# Patient Record
Sex: Male | Born: 1965 | Race: White | Hispanic: No | Marital: Married | Smoking: Never smoker
Health system: Southern US, Community
[De-identification: ages and names within clinical notes are randomized; demographics above are authoritative.]

## PROBLEM LIST (undated history)

## (undated) DIAGNOSIS — K7460 Unspecified cirrhosis of liver: Secondary | ICD-10-CM

## (undated) DIAGNOSIS — I639 Cerebral infarction, unspecified: Secondary | ICD-10-CM

## (undated) DIAGNOSIS — I10 Essential (primary) hypertension: Secondary | ICD-10-CM

## (undated) DIAGNOSIS — E7800 Pure hypercholesterolemia, unspecified: Secondary | ICD-10-CM

---

## 2016-07-30 HISTORY — PX: PARACENTESIS: SHX844

## 2016-10-27 DIAGNOSIS — I639 Cerebral infarction, unspecified: Secondary | ICD-10-CM | POA: Diagnosis not present

## 2016-10-27 DIAGNOSIS — E782 Mixed hyperlipidemia: Secondary | ICD-10-CM

## 2016-10-27 DIAGNOSIS — I10 Essential (primary) hypertension: Secondary | ICD-10-CM

## 2016-10-27 DIAGNOSIS — K7031 Alcoholic cirrhosis of liver with ascites: Secondary | ICD-10-CM | POA: Diagnosis not present

## 2016-10-28 ENCOUNTER — Inpatient Hospital Stay (HOSPITAL_COMMUNITY)
Admission: EM | Admit: 2016-10-28 | Discharge: 2016-11-01 | DRG: 065 | Disposition: A | Discharge status: Rehabilitation Facility | Payer: Medicaid Other | Source: Other Acute Inpatient Hospital | Attending: Neurology | Admitting: Neurology

## 2016-10-28 ENCOUNTER — Inpatient Hospital Stay (HOSPITAL_COMMUNITY): Payer: Medicaid Other

## 2016-10-28 ENCOUNTER — Encounter (HOSPITAL_COMMUNITY): Payer: Self-pay | Admitting: Neurology

## 2016-10-28 DIAGNOSIS — I6502 Occlusion and stenosis of left vertebral artery: Secondary | ICD-10-CM | POA: Diagnosis present

## 2016-10-28 DIAGNOSIS — Z7141 Alcohol abuse counseling and surveillance of alcoholic: Secondary | ICD-10-CM

## 2016-10-28 DIAGNOSIS — Z79899 Other long term (current) drug therapy: Secondary | ICD-10-CM

## 2016-10-28 DIAGNOSIS — Z6830 Body mass index (BMI) 30.0-30.9, adult: Secondary | ICD-10-CM

## 2016-10-28 DIAGNOSIS — E119 Type 2 diabetes mellitus without complications: Secondary | ICD-10-CM | POA: Diagnosis present

## 2016-10-28 DIAGNOSIS — R29711 NIHSS score 11: Secondary | ICD-10-CM | POA: Diagnosis present

## 2016-10-28 DIAGNOSIS — K7030 Alcoholic cirrhosis of liver without ascites: Secondary | ICD-10-CM | POA: Diagnosis present

## 2016-10-28 DIAGNOSIS — D62 Acute posthemorrhagic anemia: Secondary | ICD-10-CM | POA: Diagnosis present

## 2016-10-28 DIAGNOSIS — Z8673 Personal history of transient ischemic attack (TIA), and cerebral infarction without residual deficits: Secondary | ICD-10-CM

## 2016-10-28 DIAGNOSIS — Z8249 Family history of ischemic heart disease and other diseases of the circulatory system: Secondary | ICD-10-CM | POA: Diagnosis not present

## 2016-10-28 DIAGNOSIS — K7010 Alcoholic hepatitis without ascites: Secondary | ICD-10-CM | POA: Diagnosis present

## 2016-10-28 DIAGNOSIS — E669 Obesity, unspecified: Secondary | ICD-10-CM | POA: Diagnosis present

## 2016-10-28 DIAGNOSIS — I10 Essential (primary) hypertension: Secondary | ICD-10-CM | POA: Diagnosis present

## 2016-10-28 DIAGNOSIS — G8191 Hemiplegia, unspecified affecting right dominant side: Secondary | ICD-10-CM | POA: Diagnosis present

## 2016-10-28 DIAGNOSIS — I63112 Cerebral infarction due to embolism of left vertebral artery: Secondary | ICD-10-CM | POA: Diagnosis not present

## 2016-10-28 DIAGNOSIS — I63212 Cerebral infarction due to unspecified occlusion or stenosis of left vertebral arteries: Secondary | ICD-10-CM | POA: Diagnosis not present

## 2016-10-28 DIAGNOSIS — E7800 Pure hypercholesterolemia, unspecified: Secondary | ICD-10-CM | POA: Diagnosis present

## 2016-10-28 DIAGNOSIS — R7303 Prediabetes: Secondary | ICD-10-CM | POA: Diagnosis not present

## 2016-10-28 DIAGNOSIS — E782 Mixed hyperlipidemia: Secondary | ICD-10-CM | POA: Diagnosis not present

## 2016-10-28 DIAGNOSIS — I63012 Cerebral infarction due to thrombosis of left vertebral artery: Secondary | ICD-10-CM | POA: Diagnosis not present

## 2016-10-28 DIAGNOSIS — F4323 Adjustment disorder with mixed anxiety and depressed mood: Secondary | ICD-10-CM | POA: Diagnosis not present

## 2016-10-28 DIAGNOSIS — I69351 Hemiplegia and hemiparesis following cerebral infarction affecting right dominant side: Secondary | ICD-10-CM | POA: Diagnosis not present

## 2016-10-28 DIAGNOSIS — E871 Hypo-osmolality and hyponatremia: Secondary | ICD-10-CM | POA: Diagnosis not present

## 2016-10-28 DIAGNOSIS — E785 Hyperlipidemia, unspecified: Secondary | ICD-10-CM

## 2016-10-28 DIAGNOSIS — I639 Cerebral infarction, unspecified: Secondary | ICD-10-CM | POA: Diagnosis present

## 2016-10-28 DIAGNOSIS — R471 Dysarthria and anarthria: Secondary | ICD-10-CM | POA: Diagnosis present

## 2016-10-28 DIAGNOSIS — D72829 Elevated white blood cell count, unspecified: Secondary | ICD-10-CM

## 2016-10-28 DIAGNOSIS — N501 Vascular disorders of male genital organs: Secondary | ICD-10-CM | POA: Diagnosis not present

## 2016-10-28 DIAGNOSIS — I69331 Monoplegia of upper limb following cerebral infarction affecting right dominant side: Secondary | ICD-10-CM | POA: Diagnosis not present

## 2016-10-28 DIAGNOSIS — G441 Vascular headache, not elsewhere classified: Secondary | ICD-10-CM | POA: Diagnosis not present

## 2016-10-28 DIAGNOSIS — K219 Gastro-esophageal reflux disease without esophagitis: Secondary | ICD-10-CM

## 2016-10-28 DIAGNOSIS — E876 Hypokalemia: Secondary | ICD-10-CM | POA: Diagnosis not present

## 2016-10-28 DIAGNOSIS — K7031 Alcoholic cirrhosis of liver with ascites: Secondary | ICD-10-CM | POA: Diagnosis not present

## 2016-10-28 HISTORY — DX: Essential (primary) hypertension: I10

## 2016-10-28 HISTORY — DX: Unspecified cirrhosis of liver: K74.60

## 2016-10-28 HISTORY — DX: Cerebral infarction, unspecified: I63.9

## 2016-10-28 HISTORY — DX: Pure hypercholesterolemia, unspecified: E78.00

## 2016-10-28 LAB — HEPARIN LEVEL (UNFRACTIONATED): Heparin Unfractionated: <0.1 IU/mL — ABNORMAL LOW (ref 0.30–0.70)

## 2016-10-28 LAB — ECHOCARDIOGRAM COMPLETE
AOASC: 41 cm
CHL CUP DOP CALC LVOT VTI: 23.4 cm
CHL CUP MV DEC (S): 292
CHL CUP STROKE VOLUME: 61 mL
E decel time: 292 msec
EERAT: 5.65
FS: 33 % (ref 28–44)
HEIGHTINCHES: 70 in
IVS/LV PW RATIO, ED: 1.17
LA diam end sys: 36 mm
LA diam index: 1.67 cm/m2
LASIZE: 36 mm
LAVOL: 42.7 mL
LAVOLA4C: 27.6 mL
LAVOLIN: 19.9 mL/m2
LDCA: 2.84 cm2
LV E/e'average: 5.65
LV SIMPSON'S DISK: 72
LV TDI E'MEDIAL: 7.7
LV dias vol index: 39 mL/m2
LV sys vol index: 11 mL/m2
LV sys vol: 23 mL (ref 21–61)
LVDIAVOL: 84 mL (ref 62–150)
LVEEMED: 5.65
LVELAT: 11.8 cm/s
LVOT SV: 66 mL
LVOT diameter: 19 mm
LVOT peak grad rest: 5 mmHg
LVOTPV: 113 cm/s
Lateral S' vel: 13.1 cm/s
MV pk A vel: 86.4 m/s
MVPKEVEL: 66.7 m/s
PW: 12 mm — AB (ref 0.6–1.1)
RV TAPSE: 20.9 mm
TDI e' lateral: 11.8
WEIGHTICAEL: 3446.23 [oz_av]

## 2016-10-28 LAB — CBC
HCT: 38.7 % — ABNORMAL LOW (ref 39.0–52.0)
Hemoglobin: 12.1 g/dL — ABNORMAL LOW (ref 13.0–17.0)
MCH: 27.1 pg (ref 26.0–34.0)
MCHC: 31.3 g/dL (ref 30.0–36.0)
MCV: 86.6 fL (ref 78.0–100.0)
PLATELETS: 242 10*3/uL (ref 150–400)
RBC: 4.47 MIL/uL (ref 4.22–5.81)
RDW: 18.9 % — AB (ref 11.5–15.5)
WBC: 11 10*3/uL — ABNORMAL HIGH (ref 4.0–10.5)

## 2016-10-28 LAB — CREATININE, SERUM
CREATININE: 0.72 mg/dL (ref 0.61–1.24)
GFR calc Af Amer: >60 mL/min (ref >60)
GFR calc non Af Amer: >60 mL/min (ref >60)

## 2016-10-28 LAB — MRSA PCR SCREENING: MRSA by PCR: NEGATIVE

## 2016-10-28 MED ORDER — ACETAMINOPHEN 325 MG PO TABS
650.0000 mg | ORAL_TABLET | ORAL | Status: DC | PRN
  Administered 2016-10-30 – 2016-10-31 (×2): 650 mg via ORAL
  Filled 2016-10-28 (×2): qty 2

## 2016-10-28 MED ORDER — HEPARIN (PORCINE) IN NACL 100-0.45 UNIT/ML-% IJ SOLN
1700.0000 [IU]/h | INTRAMUSCULAR | Status: DC
  Administered 2016-10-28: 1200 [IU]/h via INTRAVENOUS
  Administered 2016-10-29: 1450 [IU]/h via INTRAVENOUS
  Filled 2016-10-28 (×3): qty 250

## 2016-10-28 MED ORDER — SENNOSIDES-DOCUSATE SODIUM 8.6-50 MG PO TABS: 1.0000 | ORAL_TABLET | Freq: Every evening | ORAL | Status: DC | PRN

## 2016-10-28 MED ORDER — SODIUM CHLORIDE 0.9 % IV SOLN
INTRAVENOUS | Status: DC
  Administered 2016-10-28 – 2016-10-30 (×3): via INTRAVENOUS

## 2016-10-28 MED ORDER — STROKE: EARLY STAGES OF RECOVERY BOOK
Freq: Once | Status: AC
  Administered 2016-10-28: 14:00:00
  Filled 2016-10-28: qty 1

## 2016-10-28 MED ORDER — LORAZEPAM 2 MG/ML IJ SOLN
1.0000 mg | Freq: Once | INTRAMUSCULAR | Status: AC
  Administered 2016-10-28: 1 mg via INTRAVENOUS
  Filled 2016-10-28: qty 1

## 2016-10-28 MED ORDER — ACETAMINOPHEN 160 MG/5ML PO SOLN: 650.0000 mg | ORAL | Status: DC | PRN

## 2016-10-28 MED ORDER — ENOXAPARIN SODIUM 40 MG/0.4ML ~~LOC~~ SOLN: 40.0000 mg | SUBCUTANEOUS | Status: DC

## 2016-10-28 MED ORDER — ACETAMINOPHEN 650 MG RE SUPP: 650.0000 mg | RECTAL | Status: DC | PRN

## 2016-10-28 NOTE — H&P (Signed)
History and physical      HPI:                                                                                                                                         Jordan Thompson is an 51 y.o. male who was transferred to [facility] for further stroke workup. Patient recently was diagnosed with cirrhosis due to alcoholism to achieve recently quit 74 days ago. He also recently had a CVA on August 8 of 18. At that time his symptoms were left-sided weakness and dysphagia which had fully resolved. Patient presented to [facility] secondary to on onset of right arm weakness and right leg weakness along with numbness. Patient also endorsed some difficulty with walking but no difficulty with swallowing. On exam and was noted that he also had dysarthria. 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 She was transferred to [facility]  for further evaluation. Just prior to being transfer patient was given a load of 300 mg of Plavix.  Date last known well: Date: 10/27/2016 Time last known well: Time: 06:30 tPA Given: No: Recent CVA Modified Rankin: Rankin Score=0 NIH stroke scale 11  Past Medical History:  Diagnosis Date  . Cirrhosis of liver (HCC)   . CVA (cerebral vascular accident) (HCC)   . Hypercholesterolemia   . Hypertension     No past surgical history on file.  Family History  Problem Relation Age of Onset  . Hypertension Mother   . Hypertension Father    Social History:  reports that he has never smoked. He has never used smokeless tobacco. He reports that he drinks alcohol. He reports that he does not use drugs.  Allergies: Allergies not on file  Medications:                                                                                                                            Carvedilol Lasix Protonix Spironolactone  ROS:  History obtained from the patient  General ROS: negative for - chills, fatigue, fever, night sweats, weight gain or weight loss Psychological ROS: negative for - behavioral disorder, hallucinations, memory difficulties, mood swings or suicidal ideation Ophthalmic ROS: negative for - blurry vision, double vision, eye pain or loss of vision ENT ROS: negative for - epistaxis, nasal discharge, oral lesions, sore throat, tinnitus or vertigo Allergy and Immunology ROS: negative for - hives or itchy/watery eyes Hematological and Lymphatic ROS: negative for - bleeding problems, bruising or swollen lymph nodes Endocrine ROS: negative for - galactorrhea, hair pattern changes, polydipsia/polyuria or temperature intolerance Respiratory ROS: negative for - cough, hemoptysis, shortness of breath or wheezing Cardiovascular ROS: negative for - chest pain, dyspnea on exertion, edema or irregular heartbeat Gastrointestinal ROS: negative for - abdominal pain, diarrhea, hematemesis, nausea/vomiting or stool incontinence Genito-Urinary ROS: negative for - dysuria, hematuria, incontinence or urinary frequency/urgency Musculoskeletal ROS: negative for - joint swelling or muscular weakness Neurological ROS: as noted in HPI Dermatological ROS: negative for rash and skin lesion changes  Neurologic Examination:                                                                                                      Blood pressure (!) 130/101, pulse 85, temperature 98.4 F (36.9 C), temperature source Oral, resp. rate 15, height 5\' 10"  (1.778 m), weight 97.7 kg (215 lb 6.2 oz), SpO2 98 %.  HEENT-  Normocephalic, no lesions, without obvious abnormality.  Normal external eye and conjunctiva.  Normal TM's bilaterally.  Normal auditory  canals and external ears. Normal external nose, mucus membranes and septum.  Normal pharynx. Cardiovascular- S1, S2 normal, pulses palpable throughout   Lungs- chest clear, no wheezing, rales, normal symmetric air entry Abdomen- normal findings: bowel sounds normal Extremities- no edema Lymph-no adenopathy palpable Musculoskeletal-no joint tenderness, deformity or swelling Skin-warm and dry, no hyperpigmentation, vitiligo, or suspicious lesions  Neurological Examination Mental Status: Alert, oriented, thought content appropriate.  Speech dysarthric without evidence of aphasia.  Able to follow 3 step commands without difficulty. Cranial Nerves: II: Visual fields grossly normal,  III,IV, VI: ptosis not present, extra-ocular motions intact bilaterally, pupils equal, round, reactive to light and accommodation--- noted vertical nystagmus at rest and while performing horizontal gaze maneuvers V,VII: smile symmetric, facial light touch sensation decreased in the right lower face VIII: hearing normal bilaterally IX,X: uvula rises symmetrically XI: bilateral shoulder shrug XII: midline tongue extension Motor: Right : Upper extremity   0/5    Left:     Upper extremity   5/5  Lower extremity   0/5     Lower extremity   5/5 Tone and bulk:normal tone throughout; no atrophy noted Sensory: Pinprick and light touch decreased in the right face and right arm and leg Deep Tendon Reflexes: 2+ and symmetric throughout Plantars: Right: Upgoing  Left: downgoing Cerebellar: normal finger-to-nose on the left,  and normal heel-to-shin test on the left Gait: Not tested       Lab Results: Basic Metabolic Panel: No results for input(s): NA, K, CL, CO2, GLUCOSE, BUN, CREATININE,  CALCIUM, MG, PHOS in the last 168 hours.  Liver Function Tests: No results for input(s): AST, ALT, ALKPHOS, BILITOT, PROT, ALBUMIN in the last 168 hours. No results for input(s): LIPASE, AMYLASE in the last 168 hours. No results  for input(s): AMMONIA in the last 168 hours.  CBC: No results for input(s): WBC, NEUTROABS, HGB, HCT, MCV, PLT in the last 168 hours.  Cardiac Enzymes: No results for input(s): CKTOTAL, CKMB, CKMBINDEX, TROPONINI in the last 168 hours.  Lipid Panel: No results for input(s): CHOL, TRIG, HDL, CHOLHDL, VLDL, LDLCALC in the last 168 hours.  CBG: No results for input(s): GLUCAP in the last 168 hours.  Microbiology: No results found for this or any previous visit.  Coagulation Studies: No results for input(s): LABPROT, INR in the last 72 hours.  Imaging: No results found.     Assessment and plan discussed with with attending physician and they are in agreement.    Felicie Morn PA-C [facility] [phone]  10/28/2016, 10:44 AM   I have seen the patient reviewed the above note. He was admitted to [facility] for pica infarct on August 8, no vascular imaging was done with that hospitalization. He did not have echo. Lipid panel revealed LDL of 75   Assessment: 51 y.o. male transferred to [facility] with new onset stroke and occluded left vertebral artery and thrombus extending into the proximal vertebrobasilar junction. With his worsening, I think that this may be unstable and would favor anticoagulation with low-dose heparin is long as he doesn't have symptoms of areas of infarct.  Stroke Risk Factors - hyperlipidemia and hypertension   1. HgbA1c, fasting lipid panel 2. MRI of the brain without contrast 3. Frequent neuro checks 4. Echocardiogram 5. Prophylactic therapy-Antiplatelet med: Aspirin - dose 325mg  PO or 300mg  PR 6. Risk factor modification 7. Telemetry monitoring 8. PT consult, OT consult, Speech consult 9. If MRI without large area of infarction in the cerebellum, then I would favor starting low-dose heparin. 10. please page stroke NP  Or  PA  Or MD  from 8am -4 pm as this patient will be followed by the stroke team at this point.   You can look them up on  [email or web address]    Ritta Slot, MD [facility] [phone]  If 7pm- 7am, please page neurology on call as listed in AMION.

## 2016-10-28 NOTE — Progress Notes (Signed)
  Echocardiogram 2D Echocardiogram has been performed.  Jordan Thompson 10/28/2016, 11:55 AM

## 2016-10-28 NOTE — Evaluation (Signed)
Clinical/Bedside Swallow Evaluation Patient Details  Name: Jordan Thompson MRN: [id number] Date of Birth: 09-Jul-1965  Today's Date: 10/28/2016 Time: SLP Start Time (ACUTE ONLY): 1606 SLP Stop Time (ACUTE ONLY): 1621 SLP Time Calculation (min) (ACUTE ONLY): 15 min  Past Medical History:  Past Medical History:  Diagnosis Date  . Cirrhosis of liver (HCC)   . CVA (cerebral vascular accident) (HCC)   . Hypercholesterolemia   . Hypertension    Past Surgical History: No past surgical history on file. 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 Pt had recent admission for CVA 10/06/16 and he reports dysphagia for three days until he was advanced to a mechanical soft diet and thin liquids. He denies any other cognitive/linguistic deficits at that time. PMH also includes cirrhosis of liver, HTN   Assessment / Plan / Recommendation Clinical Impression  Pt has occasional throat clearing during PO intake but no overt coughing despite challenging with the 3 oz water test. His swallow appears to occur swiftly without additional subswallows or changes in vocal quality. His voice is hoarse at baseline, which he describes as an acute worsening, although he says that his voice first started to change with his stroke earlier this month. For today, recommend Dys 3 diet and thin liquids with use of aspiration precautions. SLP will f/u to monitor for tolerance versus need for instrumental testing. SLP Visit Diagnosis: Dysphagia, unspecified (R13.10)    Aspiration Risk  Mild aspiration risk    Diet Recommendation Dysphagia 3 (Mech soft);Thin liquid   Liquid Administration via: Cup;Straw Medication Administration: Whole meds with liquid Supervision: Patient able to self feed;Intermittent supervision to cue for compensatory strategies Compensations: Slow rate;Small sips/bites Postural Changes: Seated upright at  90 degrees    Other  Recommendations Oral Care Recommendations: Oral care BID   Follow up Recommendations  (tba)      Frequency and Duration min 2x/week  1 week       Prognosis Prognosis for Safe Diet Advancement: Good      Swallow Study   General HPI: Pt is a 51 y.o.[facility]ans[facility] with acute infarct L paramedian medulla and acute and subacute bilateral PICA infarcts. Pt had recent admission for CVA 10/06/16 and he reports dysphagia for three days until he was advanced to a mechanical soft diet and thin liquids. He denies any other cognitive/linguistic deficits at that time. PMH also includes cirrhosis of liver, HTN Type of Study: Bedside Swallow Evaluation Previous Swallow Assessment: none in chart - see HPI Diet Prior to this Study: NPO Temperature Spikes Noted: No Respiratory Status: Room air History of Recent Intubation: No Behavior/Cognition: Alert;Cooperative;Pleasant mood Oral Care Completed by SLP: No Oral Cavity - Dentition: Adequate natural dentition Patient Positioning: Upright in bed Baseline Vocal Quality: Hoarse (pt reports acute worsening but some hoarsenss from prior CVA) Volitional Cough: Strong Volitional Swallow: Able to elicit    Oral/Motor/Sensory Function Overall Oral Motor/Sensory Function: Within functional limits   Ice Chips Ice chips: Not tested   Thin Liquid Thin Liquid: Impaired Presentation: Straw Pharyngeal  Phase Impairments: Throat Clearing - Delayed    Nectar Thick Nectar Thick Liquid: Not tested   Honey Thick Honey Thick Liquid: Not tested   Puree Puree: Within functional limits Presentation: Spoon   Solid   GO   Solid: Impaired Pharyngeal Phase Impairments: Throat Clearing - Delayed        Maxcine Hamaiewonsky, Larinda Herter 10/28/2016,4:42 PM  Germain Osgood, M.A. CCC-SLP [phone]

## 2016-10-28 NOTE — Progress Notes (Signed)
ANTICOAGULATION CONSULT NOTE - Initial Consult  Pharmacy Consult for heparin Indication: stroke  Allergies not on file  Patient Measurements: Height: 5\' 10"  (177.8 cm) Weight: 215 lb 6.2 oz (97.7 kg) IBW/kg (Calculated) : 73 Heparin Dosing Weight: 93kg  Vital Signs: Temp: 97.9 F (36.6 C) (08/30 1200) Temp Source: Oral (08/30 1200) BP: 140/90 (08/30 1200) Pulse Rate: 77 (08/30 1200)  Labs:  Recent Labs  10/28/16 1043  HGB 12.1*  HCT 38.7*  PLT 242  CREATININE 0.72    Estimated Creatinine Clearance: 128.1 mL/min (by C-G formula based on SCr of 0.72 mg/dL).   Medical History: Past Medical History:  Diagnosis Date  . Cirrhosis of liver (HCC)   . CVA (cerebral vascular accident) (HCC)   . Hypercholesterolemia   . Hypertension     Medications:  Infusions:  . sodium chloride 75 mL/hr at 10/28/16 1409  . heparin      Assessment: Jordan Thompson [facility] work up. 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 To start IV heparin per neuro recommendations. Baseline H/H and platelets are WNL.   Goal of Therapy:  Heparin level 0.3-0.5 units/ml Monitor platelets by anticoagulation protocol: Yes   Plan:  Heparin gtt 1200 units/hr - NO BOLUS Check an 8 hr heparin level Daily heparin level and CBC  Annabel Gibeau, Drake Leachachel Lynn 10/28/2016,2:40 PM

## 2016-10-29 DIAGNOSIS — I63212 Cerebral infarction due to unspecified occlusion or stenosis of left vertebral arteries: Secondary | ICD-10-CM

## 2016-10-29 DIAGNOSIS — Z8673 Personal history of transient ischemic attack (TIA), and cerebral infarction without residual deficits: Secondary | ICD-10-CM

## 2016-10-29 DIAGNOSIS — E785 Hyperlipidemia, unspecified: Secondary | ICD-10-CM

## 2016-10-29 LAB — CBC
HCT: 37.5 % — ABNORMAL LOW (ref 39.0–52.0)
Hemoglobin: 11.5 g/dL — ABNORMAL LOW (ref 13.0–17.0)
MCH: 26.7 pg (ref 26.0–34.0)
MCHC: 30.7 g/dL (ref 30.0–36.0)
MCV: 87 fL (ref 78.0–100.0)
PLATELETS: 220 10*3/uL (ref 150–400)
RBC: 4.31 MIL/uL (ref 4.22–5.81)
RDW: 19.2 % — ABNORMAL HIGH (ref 11.5–15.5)
WBC: 11.5 10*3/uL — ABNORMAL HIGH (ref 4.0–10.5)

## 2016-10-29 LAB — BASIC METABOLIC PANEL
Anion gap: 9 (ref 5–15)
CHLORIDE: 104 mmol/L (ref 101–111)
CO2: 25 mmol/L (ref 22–32)
Calcium: 9 mg/dL (ref 8.9–10.3)
Creatinine, Ser: 0.7 mg/dL (ref 0.61–1.24)
GFR calc Af Amer: >60 mL/min (ref >60)
GFR calc non Af Amer: >60 mL/min (ref >60)
GLUCOSE: 115 mg/dL — AB (ref 65–99)
POTASSIUM: 4 mmol/L (ref 3.5–5.1)
SODIUM: 138 mmol/L (ref 135–145)

## 2016-10-29 LAB — LIPID PANEL
CHOL/HDL RATIO: 5.2 ratio
CHOLESTEROL: 88 mg/dL (ref 0–200)
HDL: 17 mg/dL — ABNORMAL LOW (ref >40)
LDL Cholesterol: 50 mg/dL (ref 0–99)
Triglycerides: 104 mg/dL (ref <150)
VLDL: 21 mg/dL (ref 0–40)

## 2016-10-29 LAB — RAPID URINE DRUG SCREEN, HOSP PERFORMED
Amphetamines: NOT DETECTED
BENZODIAZEPINES: POSITIVE — AB
Barbiturates: NOT DETECTED
COCAINE: NOT DETECTED
OPIATES: NOT DETECTED
TETRAHYDROCANNABINOL: NOT DETECTED

## 2016-10-29 LAB — HEPARIN LEVEL (UNFRACTIONATED)
Heparin Unfractionated: <0.1 IU/mL — ABNORMAL LOW (ref 0.30–0.70)
Heparin Unfractionated: 0.15 IU/mL — ABNORMAL LOW (ref 0.30–0.70)

## 2016-10-29 LAB — AMMONIA: Ammonia: 29 umol/L (ref 9–35)

## 2016-10-29 LAB — HEMOGLOBIN A1C
Hgb A1c MFr Bld: 6.1 % — ABNORMAL HIGH (ref 4.8–5.6)
MEAN PLASMA GLUCOSE: 128.37 mg/dL

## 2016-10-29 LAB — HIV ANTIBODY (ROUTINE TESTING W REFLEX): HIV SCREEN 4TH GENERATION: NONREACTIVE

## 2016-10-29 LAB — PROTIME-INR
INR: 1.19
PROTHROMBIN TIME: 15 s (ref 11.4–15.2)

## 2016-10-29 MED ORDER — ASPIRIN 325 MG PO TABS
325.0000 mg | ORAL_TABLET | Freq: Once | ORAL | Status: AC
  Administered 2016-10-29: 325 mg via ORAL
  Filled 2016-10-29: qty 1

## 2016-10-29 MED ORDER — HEPARIN (PORCINE) IN NACL 100-0.45 UNIT/ML-% IJ SOLN
2100.0000 [IU]/h | INTRAMUSCULAR | Status: DC
  Administered 2016-10-29 – 2016-10-30 (×2): 1850 [IU]/h via INTRAVENOUS
  Administered 2016-10-30 – 2016-10-31 (×2): 2100 [IU]/h via INTRAVENOUS
  Filled 2016-10-29 (×5): qty 250

## 2016-10-29 NOTE — Evaluation (Signed)
Physical Therapy Evaluation Patient Details Name: Jordan Thompson MRN: [id number] DOB: October 07, 1965 Today's Date: 10/29/2016   History of Present Illness  Pt is a 51 y.o. maletransferred to [facility] for stroke work up. 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 Pt had recent admission for left PICA stroke on 10/06/16. PMH also includes cirrhosis of liver, HTN  Clinical Impression  Patient presents with decreased independence with mobility due to R hemiparesis, sensory and balance deficits.  He will benefit from skilled PT in the acute setting to allow return home with family support following CIR level rehab stay.  Currently requiring +2 A for sit to stand and bed to chair transfers with impaired sitting and standing balance.  Was mod I with RW at home previously.    Follow Up Recommendations CIR    Equipment Recommendations  Other (comment) (TBA)    Recommendations for Other Services       Precautions / Restrictions Precautions Precautions: Fall Precaution Comments: pusher      Mobility  Bed Mobility Overal bed mobility: Needs Assistance Bed Mobility: Rolling;Sidelying to Sit Rolling: Min assist Sidelying to sit: Mod assist;+2 for safety/equipment   Sit to supine: Max assist   General bed mobility comments: assist and cues for hand placement coming upright, assist for legs and trunk  Transfers Overall transfer level: Needs assistance Equipment used: 2 person hand held assist Transfers: Sit to/from [employer] Sit to Stand: Max assist;+2 physical assistance Stand pivot transfers:  (required [place] eo fstedy) Squat pivot transfers: Max assist     General transfer comment: assist to block R LE and for standing to facilitate hip extension, stood second attempt with chair back on L side to facilitate L weight shift; pivot to chair with cues for hand placement and L weight shift,  lifting help to get to chair  Ambulation/Gait             General Gait Details: unable  Stairs            Wheelchair Mobility    Modified Rankin (Stroke Patients Only) Modified Rankin (Stroke Patients Only) Pre-Morbid Rankin Score: Moderate disability Modified Rankin: Severe disability     Balance Overall balance assessment: Needs assistance   Sitting balance-Leahy Scale: Poor Sitting balance - Comments: R bias; at least minguard to close S for safety   Standing balance support: Single extremity supported Standing balance-Leahy Scale: Zero Standing balance comment: + 2 for standing with R knee and hip blocked and cues/A for L weight shift x about 1-2 minutes x 2 reps                             Pertinent Vitals/Pain Pain Assessment: No/denies pain    Home Living Family/patient expects to be discharged to:: Inpatient rehab Living Arrangements: Spouse/significant other Available Help at Discharge: Family Type of Home: House Home Access: Level entry     Home Layout: Two level;Bed/bath upstairs Home Equipment: Walker - 2 wheels;Grab bars - tub/shower      Prior Function Level of Independence: Independent with assistive device(s)         Comments: wife assisted with entry and exit from shower; using walker since last stroke and doing "his own PT with weights and exercises given by his neice who is a nurse in PT"     Hand Dominance   Dominant Hand: Right    Extremity/Trunk  Assessment   Upper Extremity Assessment Upper Extremity Assessment: Defer to OT evaluation RUE Deficits / Details: Pt with minimal active shoulder elevation and trace elbow flexion with facilitation. Brunstrom I (presynergy)- hand and arm. non-functional RUE at this time RUE Sensation: decreased light touch RUE Coordination: decreased fine motor;decreased gross motor    Lower Extremity Assessment Lower Extremity Assessment: RLE deficits/detail RLE Deficits /  Details: no active movement, but toes move involunarily with stimulation    Cervical / Trunk Assessment Cervical / Trunk Assessment: Other exceptions Cervical / Trunk Exceptions: Able to achieve midline with vc but unable to maintain  Communication   Communication: Other (comment) (hoarse)  Cognition Arousal/Alertness: Awake/alert Behavior During Therapy: WFL for tasks assessed/performed Overall Cognitive Status: No family/caregiver present to determine baseline cognitive functioning                                 General Comments: will further assess      General Comments General comments (skin integrity, edema, etc.): Patient anxious and reports feeling depression about stroke deficits.      Exercises     Assessment/Plan    PT Assessment Patient needs continued PT services  PT Problem List Decreased strength;Decreased balance;Decreased knowledge of use of DME;Decreased activity tolerance;Decreased safety awareness;Decreased mobility;Impaired tone       PT Treatment Interventions DME instruction;Therapeutic activities;Patient/family education;Therapeutic exercise;Balance training;Wheelchair mobility training;Functional mobility training;Neuromuscular re-education    PT Goals (Current goals can be found in the Care Plan section)  Acute Rehab PT Goals Patient Stated Goal: to go to rehab PT Goal Formulation: With patient Time For Goal Achievement: 11/12/16 Potential to Achieve Goals: Good    Frequency Min 4X/week   Barriers to discharge        Co-evaluation               AM-PAC PT "6 Clicks" Daily Activity  Outcome Measure Difficulty turning over in bed (including adjusting bedclothes, sheets and blankets)?: A Lot Difficulty moving from lying on back to sitting on the side of the bed? : Unable Difficulty sitting down on and standing up from a chair with arms (e.g., wheelchair, bedside commode, etc,.)?: Unable Help needed moving to and from a bed  to chair (including a wheelchair)?: Total Help needed walking in hospital room?: Total Help needed climbing 3-5 steps with a railing? : Total 6 Click Score: 7    End of Session Equipment Utilized During Treatment: Gait belt Activity Tolerance: Patient tolerated treatment well Patient left: in chair;with call bell/phone within reach   PT Visit Diagnosis: Hemiplegia and hemiparesis;Muscle weakness (generalized) (M62.81);Other symptoms and signs involving the nervous system (R29.898) Hemiplegia - Right/Left: Right Hemiplegia - dominant/non-dominant: Dominant Hemiplegia - caused by: Cerebral infarction    Time: 1315-1346 PT Time Calculation (min) (ACUTE ONLY): 31 min   Charges:   PT Evaluation $PT Eval High Complexity: 1 High PT Treatments $Therapeutic Activity: 8-22 mins   PT G CodesSheran Lawless:        Cyndi Markian Glockner, [place] [phone] 10/29/2016   Elray Mcgregorynthia Malaiyah Achorn 10/29/2016, 5:15 PM

## 2016-10-29 NOTE — Evaluation (Signed)
Speech Language Pathology Evaluation Patient Details Name: Jordan Thompson MRN: [id number] DOB: [birth year] Today's Date: 10/29/2016 Time: [id number] SLP Time Calculation (min) (ACUTE ONLY): 18 min  Problem List:  Patient Active Problem List   Diagnosis Date Noted  . Stroke (cerebrum) (HCC) 10/28/2016   Past Medical History:  Past Medical History:  Diagnosis Date  . Cirrhosis of liver (HCC)   . CVA (cerebral vascular accident) (HCC)   . Hypercholesterolemia   . Hypertension    Past Surgical History: No past surgical history on file. HPI:  Pt is a 51 [facility] with acute infarct L paramedian medulla and acute and subacute bilateral PICA infarcts. Pt had recent admission for CVA 10/06/16 and he reports dysphagia for three days until he was advanced to a mechanical soft diet and thin liquids. He denies any other cognitive/linguistic deficits at that time. PMH also includes cirrhosis of liver, HTN   Assessment / Plan / Recommendation Clinical Impression  Pt demonstrates mild speech and voice impairment secondary to right CN XII weakness and suspected paresis of right arytenoid. Pt has lingual deviation to the right with very mild dysarthria. Vocal quality is mildly dysphonic. He feels that both conditions are rapidly improving. No cognitive or linguistic deficits were identified. Will not recommend immediate f/u with SLP as further spontaneous recovery of speech is expected. Advised pt to f/u with ENT after d/c if vocal quality has not resolved. Pt can then be referred to SLP if ENT recommends it. No further acute f/u needed.     SLP Assessment  SLP Recommendation/Assessment: Patient needs continued Speech Lanaguage Pathology Services SLP Visit Diagnosis: Dysarthria and anarthria (R47.1)    Follow Up Recommendations  Outpatient SLP (ENT consult first)    Frequency and Duration           SLP Evaluation Cognition  Overall Cognitive Status: Within Functional  Limits for tasks assessed Arousal/Alertness: Awake/alert Orientation Level: Oriented X4 Attention: Alternating Alternating Attention: Appears intact Memory: Appears intact Awareness: Appears intact Problem Solving: Appears intact Safety/Judgment: Appears intact       Comprehension  Auditory Comprehension Overall Auditory Comprehension: Appears within functional limits for tasks assessed    Expression Verbal Expression Overall Verbal Expression: Appears within functional limits for tasks assessed Written Expression Dominant Hand: Right Written Expression: Unable to assess (comment)   Oral / Motor  Oral Motor/Sensory Function Overall Oral Motor/Sensory Function: Moderate impairment Facial ROM: Within Functional Limits Facial Symmetry: Within Functional Limits Facial Strength: Within Functional Limits Facial Sensation: Within Functional Limits Lingual ROM: Within Functional Limits Lingual Symmetry: Abnormal symmetry right;Suspected CN XII (hypoglossal) dysfunction Lingual Strength: Within Functional Limits Lingual Sensation: Within Functional Limits Velum: Within Functional Limits Mandible: Within Functional Limits Motor Speech Overall Motor Speech: Impaired Respiration: Within functional limits Phonation: Hoarse Resonance: Within functional limits Articulation: Impaired Level of Impairment: Conversation Intelligibility: Intelligible Motor Planning: Witnin functional limits Motor Speech Errors: Aware;Consistent Effective Techniques: Over-articulate   GO                   Harlon DittyBonnie Nautika Cressey, MA CCC-SLP [phone]  Claudine MoutonDeBlois, Daden Mahany Caroline 10/29/2016, 10:13 AM

## 2016-10-29 NOTE — Progress Notes (Signed)
ANTICOAGULATION CONSULT NOTE   Pharmacy Consult for heparin Indication: stroke  Allergies  Allergen Reactions  . Aleve [Naproxen Sodium] Other (See Comments)    Pt will not take  . Penicillins Nausea And Vomiting and Other (See Comments)    Pt states had a rxn as a child Has patient had a PCN reaction causing immediate rash, facial/tongue/throat swelling, SOB or lightheadedness with hypotension: No Has patient had a PCN reaction causing severe rash involving mucus membranes or skin necrosis: No Has patient had a PCN reaction that required hospitalization: No Has patient had a PCN reaction occurring within the last 10 years: No If all of the above answers are "NO", then may proceed with Cephalosporin use.  . Pepto-Bismol  [Bismuth Subsalicylate] Nausea Only    Patient Measurements: Height: 5\' 10"  (177.8 cm) Weight: 215 lb 6.2 oz (97.7 kg) IBW/kg (Calculated) : 73 Heparin Dosing Weight: 93kg  Vital Signs: Temp: 98.4 F (36.9 C) (08/31 1600) Temp Source: Oral (08/31 1600) BP: 144/100 (08/31 2000) Pulse Rate: 76 (08/31 2000)  Labs:  Recent Labs  10/28/16 1043 10/28/16 2312 10/29/16 0220 10/29/16 0719 10/29/16 1002 10/29/16 2001  HGB 12.1*  --  11.5*  --   --   --   HCT 38.7*  --  37.5*  --   --   --   PLT 242  --  220  --   --   --   LABPROT  --   --   --  15.0  --   --   INR  --   --   --  1.19  --   --   HEPARINUNFRC  --  <0.10*  --   --  <0.10* 0.15*  CREATININE 0.72  --   --  0.70  --   --     Estimated Creatinine Clearance: 128.1 mL/min (by C-G formula based on SCr of 0.7 mg/dL).  Medications:  Infusions:  . sodium chloride 75 mL/hr at 10/29/16 1900  . heparin      Assessment: Jordan Thompson transferred from [facility]. 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 Started on IV heparin per neuro recommendations. Heparin level remains undetectable despite rate  increases. No bleeding or other issues notes.   PM f/u > heparin level remains below goal this evening.  Per RN, no known issues with IV infusion.  Goal of Therapy:  Heparin level 0.3-0.5 units/ml Monitor platelets by anticoagulation protocol: Yes   Plan:  Increase heparin gtt to 1850 units/hr. Check an 6 hr heparin level Daily heparin level and CBC  Tad MooreJessica Taimane Stimmel, Pharm D, BCPS  Clinical Pharmacist Pager [phone]  10/29/2016 8:44 PM    Lillion Elbert, Gwenlyn FoundJessica C 10/29/2016,8:44 PM

## 2016-10-29 NOTE — Progress Notes (Signed)
STROKE TEAM PROGRESS NOTE   HISTORY OF PRESENT ILLNESS (per record)  Jordan Thompson is an 51 y.o. male who was [facility] for further stroke workup. Patient recently was diagnosed with cirrhosis due to alcoholism to achieve recently quit 74 days ago. He also recently had a CVA on August 8 of 18. At that time his symptoms were left-sided weakness and dysphagia which had fully resolved. Patient [facility] secondary to on onset of right arm weakness and right leg weakness along with numbness. Patient also endorsed some difficulty with walking but no difficulty with swallowing. On exam and was noted that he also had dysarthria. 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 She was [facility] for [facility] evaluation. Just prior to being transfer patient was given a load of 300 mg of Plavix.  In early august, had LeftPICA Ischemic Stroke   Date last known well: Date: 10/27/2016 Time last known well: Time: 06:30 tPA Given: No: Recent CVA Modified Rankin: Rankin Score=0 NIH stroke scale 11   Patient was not administered IV t-PA secondary to recent cva. He was admitted to the neuro ICU for further evaluation and treatment.   SUBJECTIVE (INTERVAL HISTORY) No acute events overnight. Pt feels his slurry speech getting better, hoarseness getting better. On heparin drip but level not detectable. Will give one time dose ASA today. Continue heparin.    OBJECTIVE Temp:  [97.9 F (36.6 C)-98.6 F (37 C)] 98.4 F (36.9 C) (08/31 1600) Pulse Rate:  [61-87] 76 (08/31 2000) Cardiac Rhythm: Normal sinus rhythm (08/31 0900) Resp:  [11-18] 13 (08/31 2000) BP: (116-151)/(83-104) 144/100 (08/31 2000) SpO2:  [94 %-99 %] 97 % (08/31 2000)  CBC:   Recent  Labs Lab 10/28/16 1043 10/29/16 0220  WBC 11.0* 11.5*  HGB 12.1* 11.5*  HCT 38.7* 37.5*  MCV 86.6 87.0  PLT 242 220    Basic Metabolic Panel:   Recent Labs Lab 10/28/16 1043 10/29/16 0719  NA  --  138  K  --  4.0  CL  --  104  CO2  --  25  GLUCOSE  --  115*  BUN  --  <5*  CREATININE 0.72 0.70  CALCIUM  --  9.0    Lipid Panel:     Component Value Date/Time   CHOL 88 10/29/2016 0220   TRIG 104 10/29/2016 0220   HDL 17 (L) 10/29/2016 0220   CHOLHDL 5.2 10/29/2016 0220   VLDL 21 10/29/2016 0220   LDLCALC 50 10/29/2016 0220   HgbA1c:  Lab Results  Component Value Date   HGBA1C 6.1 (H) 10/29/2016   Urine Drug Screen:     Component Value Date/Time   LABOPIA NONE DETECTED 10/29/2016 0853   COCAINSCRNUR NONE DETECTED 10/29/2016 0853   LABBENZ POSITIVE (A) 10/29/2016 0853   AMPHETMU NONE DETECTED 10/29/2016 0853   THCU NONE DETECTED 10/29/2016 0853   LABBARB NONE DETECTED 10/29/2016 0853    Alcohol Level No results found for: ETH  IMAGING I have personally reviewed the radiological images below and agree with the radiology interpretations.  [name] Contrast 10/28/2016 IMPRESSION: 1. Acute infarct in the left paramedian medulla. 2. Acute and subacute bilateral PICA territory infarcts as above. 3. Late subacute to chronic left medullary infarct.   CTA neck  - left VA occlusion with  thready reconstitution in the neck - atherosclerosis without hemodynamically significant stenosis of the carotid arteries.  - severe right C6-7 neural foraminal narrowing  CTA head - occluded left VA and thrombus extending into central VBJ, at risk of embolization - no anterior cirdulation LVO or significant stenosis  TTE - Left ventricle: The cavity size was normal. There was mild   concentric hypertrophy. Systolic function was normal. The   estimated ejection fraction was in the range of 60% to 65%. Wall   motion was normal; there were no regional wall motion    abnormalities. Doppler parameters are consistent with abnormal   left ventricular relaxation (grade 1 diastolic dysfunction). - Aortic valve: Transvalvular velocity was within the normal range.   There was no stenosis. There was no regurgitation. - Aorta: Ascending aortic diameter: 41 mm (S). - Ascending aorta: The ascending aorta was mildly dilated. - Mitral valve: Transvalvular velocity was within the normal range.   There was no evidence for stenosis. There was no regurgitation. - Right ventricle: The cavity size was normal. Wall thickness was   normal. Systolic function was normal. - Tricuspid valve: There was no regurgitation.   PHYSICAL EXAM  Temp:  [97.9 F (36.6 C)-98.6 F (37 C)] 98.4 F (36.9 C) (08/31 1600) Pulse Rate:  [61-87] 76 (08/31 2000) Resp:  [11-18] 13 (08/31 2000) BP: (116-151)/(83-104) 144/100 (08/31 2000) SpO2:  [94 %-99 %] 97 % (08/31 2000)  General - Well nourished, well developed, in no apparent distress.  Ophthalmologic - Fundi not visualized due to .  Cardiovascular - Regular rate and rhythm.  Mental Status -  Level of arousal and orientation to time, place, and person were intact. Language including expression, naming, repetition, comprehension was assessed and found intact. Fund of Knowledge was assessed and was intact.  Cranial Nerves II - XII - II - Visual field intact OU. III, IV, VI - Extraocular movements intact, PERRL, left mild ptosis. V - Facial sensation intact bilaterally. VII - right nasolabial fold flattening VIII - Hearing & vestibular intact bilaterally, left end-gaze nystagmus. X - Palate elevates symmetrically, mild dysarthria and hoarseness. XI - Chin turning & shoulder shrug intact bilaterally. XII - Tongue protrusion intact.  Motor Strength - The patient's strength was normal in LUE and LLE, but RUE 0/5 and RLE 1/5.  Bulk was normal and fasciculations were absent.   Motor Tone - Muscle tone was assessed at the neck and  appendages and was normal.  Reflexes - The patient's reflexes were 1+ in all extremities and right babinski and triple reflex positive  Sensory - Light touch, temperature/pinprick were assessed and were decreased on the right, about 60-80% of the left.    Coordination - The patient had mild dysmetria at left hands with left FTN.  Tremor was absent.  Gait and Station - not tested.  ASSESSMENT/PLAN Mr. Maxximus Gotay is a 51 y.o. male with history of recent left PICA stroke, long term alcohol use disorder leading to cirrhosis and hepatitis presenting with . He did not receive IV t-PA due recent stroke .   Stroke - acute right cerebellar PICA territory and left paramedian medulla infarcts, likely due to left VA dissection/occlusion with thrombus extension to VBJ. presumebaly left VA also supplies part of the right PICA territory.   Resultant: right sided weakness  MRI head: Acute infarct in the left paramedian medulla. Acute and subacute bilateral PICA territory infarcts as above. Late subacute to chronic left medullary infarct.  CTA head and neck -  left VA occlusion with thrombus extension to VBJ  2D Echo  EF 60-65% no valvular problems   LDL 50  HgbA1c 6.1  Heparin gtt for VTE prophylaxis Diet Heart Room service appropriate? Yes; Fluid consistency: Thin  aspirin 81 mg daily prior to admission, now on heparin IV. Once stable on heparin IV, consider to transition to DAPT for 3 months and then plavix alone for stroke prevention  Patient counseled to be compliant with his antithrombotic medications  Ongoing aggressive stroke risk factor management  Therapy recommendations:  CIR  Disposition:  Pending  Recent stroke 10/06/16  Left PICA and left medullary infarcts - due to left VA occlusion  Dysphagia resolved over time  On ASA and lipitor PTA  Liver cirrhosis   Alcohol related  Normal INR and ammonia level  On B1/FA/MVI  Hypertension  Stable  Permissive  hypertension (OK if < 180/105) but gradually normalize in 5-7 days  Long-term BP goal normotensive  Hyperlipidemia  Home meds:  Atorvastatin 40  LDL 50, goal < 70  Continue statin at discharge  Diabetes  HgbA1c 6.1, goal < 7.0  Controlled  Other Stroke Risk Factors  Advanced age  ETOH use, advised to stop drinking due to cirrhosis  Obesity, Body mass index is 30.91 kg/m., recommend weight loss, diet and exercise as appropriate   Other Active Problems    Hospital day # 1  This patient is critically ill due to recurrent stroke with thrombus at left VBJ, recent stroke, liver cirrhosis and at significant risk of neurological worsening, death form recurrent stroke, hemorrhagic transformation, bleeding, hyperammonemia and liver failure. This patient's care requires constant monitoring of vital signs, hemodynamics, respiratory and cardiac monitoring, review of multiple databases, neurological assessment, discussion with family, other specialists and medical decision making of high complexity. I spent 40 minutes of neurocritical care time in the care of this patient.  Marvel Plan, MD PhD Stroke Neurology 10/29/2016 9:30 PM    To contact Stroke Continuity provider, please refer to [email or web address]. After hours, contact General Neurology

## 2016-10-29 NOTE — Progress Notes (Signed)
Inpatient Rehabilitation  Patient was screened by Jamilet Ambroise for appropriateness for an Inpatient Acute Rehab consult.  At this time we are recommending an Inpatient Rehab consult.  Please order if you are agreeable.    Devina Bezold, M.A., CCC/SLP Admission Coordinator  Hand Inpatient Rehabilitation  Cell [phone]  

## 2016-10-29 NOTE — Care Management Note (Signed)
Case Management Note  Patient Details  Name: Jordan Thompson MRN: [id number] Date of Birth: Sep 02, 1965  Subjective/Objective:   Pt admitted on 10/28/16 s/p new onset stroke and occluded left vertebral artery with thrombus extending into proximal vertebrobasilar junction.  PTA, pt independent, lives at home with wife.                   Action/Plan: PT/OT recommending CIR; will obtain order for rehab consult.  Will follow for discharge planning as pt progresses.   Expected Discharge Date:                  Expected Discharge Plan:  IP Rehab Facility  In-House Referral:     Discharge planning Services  CM Consult  Post Acute Care Choice:    Choice offered to:     DME Arranged:    DME Agency:     HH Arranged:    HH Agency:     Status of Service:  In process, will continue to follow  If discussed at Long Length of Stay Meetings, dates discussed:    Additional Comments:  Quintella BatonJulie W. Nikala Walsworth, RN, BSN  Trauma/Neuro ICU Case Manager [phone]

## 2016-10-29 NOTE — Progress Notes (Addendum)
ANTICOAGULATION CONSULT NOTE - Follow Up Consult  Pharmacy Consult for Heparin  Indication: stroke  Patient Measurements: Height: 5\' 10"  (177.8 cm) Weight: 215 lb 6.2 oz (97.7 kg) IBW/kg (Calculated) : 73  Vital Signs: Temp: 97.9 F (36.6 C) (08/30 2000) Temp Source: Oral (08/30 2000) BP: 139/96 (08/30 2300) Pulse Rate: 61 (08/30 2300)  Labs:  Recent Labs  10/28/16 1043 10/28/16 2312  HGB 12.1*  --   HCT 38.7*  --   PLT 242  --   HEPARINUNFRC  --  <0.10*  CREATININE 0.72  --     Estimated Creatinine Clearance: 128.1 mL/min (by C-G formula based on SCr of 0.72 mg/dL).    Assessment: 51 y/o M [facility] with acute stroke, on heparin, initial heparin level tonight is low  Goal of Therapy:  Heparin level 0.3-0.5 units/ml Monitor platelets by anticoagulation protocol: Yes   Plan:  -Inc heparin to 1450 units/hr -0800 HL -Monitor for any bleeding/mental status changes  Abran DukeLedford, Dymond Spreen 10/29/2016,12:14 AM

## 2016-10-29 NOTE — Plan of Care (Signed)
Problem: Activity: Goal: Risk for activity intolerance will decrease Outcome: Completed/Met Date Met: 10/29/16 Encouraged to stay in chair for 2 hours+  Problem: Nutrition: Goal: Adequate nutrition will be maintained Outcome: Completed/Met Date Met: 10/29/16 Passes stroke swallow.  Dys 3 diet.  Great appetite  Problem: Coping: Goal: Ability to verbalize positive feelings about self will improve Outcome: Completed/Met Date Met: 10/29/16 Patient has very optimistic and positive outlook. Goal: Ability to identify appropriate support needs will improve Outcome: Completed/Met Date Met: 10/29/16 Wife and niece are support system

## 2016-10-29 NOTE — Progress Notes (Signed)
ANTICOAGULATION CONSULT NOTE   Pharmacy Consult for heparin Indication: stroke  Allergies  Allergen Reactions  . Penicillins Nausea And Vomiting    Pt states had a rxn as a child Has patient had a PCN reaction causing immediate rash, facial/tongue/throat swelling, SOB or lightheadedness with hypotension: No Has patient had a PCN reaction causing severe rash involving mucus membranes or skin necrosis: No Has patient had a PCN reaction that required hospitalization: No Has patient had a PCN reaction occurring within the last 10 years: No If all of the above answers are "NO", then may proceed with Cephalosporin use.  . Pepto-Bismol  [Bismuth Subsalicylate] Nausea Only    Patient Measurements: Height: 5\' 10"  (177.8 cm) Weight: 215 lb 6.2 oz (97.7 kg) IBW/kg (Calculated) : 73 Heparin Dosing Weight: 93kg  Vital Signs: Temp: 98.6 F (37 C) (08/31 0800) Temp Source: Oral (08/31 0800) BP: 144/96 (08/31 0700) Pulse Rate: 67 (08/31 0700)  Labs:  Recent Labs  10/28/16 1043 10/28/16 2312 10/29/16 0220 10/29/16 0719 10/29/16 1002  HGB 12.1*  --  11.5*  --   --   HCT 38.7*  --  37.5*  --   --   PLT 242  --  220  --   --   LABPROT  --   --   --  15.0  --   INR  --   --   --  1.19  --   HEPARINUNFRC  --  <0.10*  --   --  <0.10*  CREATININE 0.72  --   --  0.70  --     Estimated Creatinine Clearance: 128.1 mL/min (by C-G formula based on SCr of 0.7 mg/dL).  Medications:  Infusions:  . sodium chloride 75 mL/hr at 10/28/16 2000  . heparin 1,450 Units/hr (10/29/16 1053)    Assessment: 51 y[facility]. 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 Started on IV heparin per neuro recommendations. Heparin level remains undetectable despite rate increases. No bleeding or other issues notes.   Goal of Therapy:  Heparin level 0.3-0.5 units/ml Monitor platelets by  anticoagulation protocol: Yes   Plan:  Increase heparin gtt to 1700 units/hr - NO BOLUS Check an 8 hr heparin level Daily heparin level and CBC  Orvetta Danielski, Drake Leachachel Lynn 10/29/2016,11:15 AM

## 2016-10-29 NOTE — Progress Notes (Signed)
  Speech Language Pathology Treatment: Dysphagia  Patient Details Name: Jordan Thompson MRN: [id number] DOB: [birth year] Today's Date: 10/29/2016 Time: 4827-0786 SLP Time Calculation (min) (ACUTE ONLY): 18 min  Assessment / Plan / Recommendation Clinical Impression  Pt demonstrates excellent tolerance of regular textured solids when assist given for set up. Pt has been talking liquids since yesterday with no increased coughing or congestion or further concern for compromised airway protection with swallowing. Vocal quality is notably dysphonic and there is right CN XII weakness with significant lingual deviation to the right. 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 Despite this finding subjective observation indicated airway protection and sensation (pt cleared throat independently x1 when wetness heard during speech). Will upgrade diet to Regular/thin. No SLP f/u needed for swallowing unless concern for pneumonia or increased congestion arises. Discussed with pt.   HPI HPI: Pt is a 51 y.[facility] with acute infarct L paramedian medulla and acute and subacute bilateral PICA infarcts. Pt had recent admission for CVA 10/06/16 and he reports dysphagia for three days until he was advanced to a mechanical soft diet and thin liquids. He denies any other cognitive/linguistic deficits at that time. PMH also includes cirrhosis of liver, HTN      SLP Plan  All goals met       Recommendations  Diet recommendations: Regular;Thin liquid Liquids provided via: Cup;Straw Medication Administration: Whole meds with liquid Supervision: Patient able to self feed Compensations: Slow rate;Small sips/bites Postural Changes and/or Swallow Maneuvers: Seated upright 90 degrees;Upright 30-60 min after meal                Oral Care Recommendations: Oral care BID SLP Visit Diagnosis: Dysphagia, unspecified (R13.10) Plan: All goals met       GO               Eliabeth Shoff, Katherene Ponto 10/29/2016, 10:00 AM

## 2016-10-29 NOTE — Progress Notes (Signed)
Occupational Therapy Evaluation Patient Details Name: Jordan Thompson MRN: [id number] DOB: 1965/03/03 Today's Date: 10/29/2016    History of Present Illness Pt is a 51 y.o. maletransferred to [facility] for stroke work up. 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 Pt had recent admission for left PICA stroke on 10/06/16. PMH also includes cirrhosis of liver, HTN   Clinical Impression   PTA, pt independent with ADL and mobility. Pt with recent CVA and wife was assisting as needed with care. Pt with significant functional decline and currently requires Max A +2 with mobility and Max A with ADL. Feel pt is an excellent CIR candidate to maximize functional level of independence to DC home with assistance of family. Pt very motivated to return to PLOF. Will follow acutely to address established goals.     Follow Up Recommendations  CIR;Supervision/Assistance - 24 hour    Equipment Recommendations  3 in 1 bedside commode;Other (comment) (TBA)    Recommendations for Other Services Rehab consult     Precautions / Restrictions Precautions Precautions: Fall Precaution Comments: pusher      Mobility Bed Mobility Overal bed mobility: Needs Assistance Bed Mobility: Sit to Supine       Sit to supine: Max assist   General bed mobility comments: assist to lower trunk adn move BLE onto bed  Transfers Overall transfer level: Needs assistance   Transfers: Sit to/from Stand;Stand Pivot Transfers Sit to Stand: Max assist;+2 physical assistance Stand pivot transfers:  (required [place] eo fstedy)            Balance Overall balance assessment: Needs assistance   Sitting balance-Leahy Scale: Poor Sitting balance - Comments: R bias                                   ADL either performed or assessed with clinical judgement   ADL Overall ADL's : Needs assistance/impaired Eating/Feeding: Set  up   Grooming: Minimal assistance   Upper Body Bathing: Moderate assistance;Sitting   Lower Body Bathing: Moderate assistance;Sit to/from stand   Upper Body Dressing : Moderate assistance   Lower Body Dressing: Maximal assistance;Sit to/from stand   Toilet Transfer: +2 for physical assistance;Maximal assistance (required use of Stedy)   Toileting- Architect and Hygiene: Maximal assistance Toileting - Clothing Manipulation Details (indicate cue type and reason): once sitting on BSC, pt able to scoot hips back onto toilet; Able to lean R to attempt hygiene after toielting with min A     Functional mobility during ADLs: Maximal assistance;+2 for physical assistance General ADL Comments: Pt attempting to trnasfer to toilet. Pt unable to maintain weight through RLE and required use of Stedy to safely transfer to [facility]  Will assess ability to complete squat pivot transfers with drop arm BSC     Vision Baseline Vision/History: Wears glasses Wears Glasses:  (not present) Patient Visual Report: Blurring of vision Additional Comments: Pt complained; impaired - will further assess     Perception Perception Spatial deficits: poor midline orientation   Praxis Praxis Praxis tested?: Within functional limits    Pertinent Vitals/Pain Pain Assessment: No/denies pain     Hand Dominance Right   Extremity/Trunk Assessment Upper Extremity Assessment Upper Extremity Assessment: RUE deficits/detail RUE Deficits / Details: Pt with minimal active shoulder elevation and trace elbow flexion with facilitation. Brunstrom I (presynergy)- hand and arm. non-functional RUE at this time  RUE Sensation: decreased light touch RUE Coordination: decreased fine motor;decreased gross motor   Lower Extremity Assessment Lower Extremity Assessment: RLE deficits/detail RLE Deficits / Details: withdraws from noxious stimuli but does not move actively   Cervical / Trunk Assessment Cervical / Trunk  Assessment: Other exceptions (R bias. Able to achieve midline positioning ) Cervical / Trunk Exceptions: Able to achieve midline with vc but unable to maintain   Communication Communication Communication: Other (comment) (mild expressive difficulty/)   Cognition Arousal/Alertness: Awake/alert Behavior During Therapy: WFL for tasks assessed/performed Overall Cognitive Status: No family/caregiver present to determine baseline cognitive functioning                                 General Comments: will further assess   General Comments       Exercises     Shoulder Instructions      Home Living Family/patient expects to be discharged to:: Private residence Living Arrangements: Spouse/significant other Available Help at Discharge: Family Type of Home: House Home Access: Level entry     Home Layout: Two level;Bed/bath upstairs Alternate Level Stairs-Number of Steps: 13 Alternate Level Stairs-Rails: Left Bathroom Shower/Tub: Producer, television/film/video: Standard     Home Equipment: Environmental consultant - 2 wheels;Grab bars - tub/shower      Lives With: Spouse    Prior Functioning/Environment Level of Independence: Independent with assistive device(s)        Comments: wife assisted with entry and exit from shower; using walker since last stroke and doing "his own PT with weights and exercises given by his neice who is a Engineer, civil (consulting) in PT"        OT Problem List: Decreased strength;Decreased range of motion;Decreased activity tolerance;Impaired balance (sitting and/or standing);Impaired vision/perception;Decreased coordination;Decreased safety awareness;Decreased knowledge of use of DME or AE;Decreased knowledge of precautions;Impaired sensation;Impaired tone;Obesity;Impaired UE functional use      OT Treatment/Interventions: Self-care/ADL training;Neuromuscular education;Therapeutic exercise;DME and/or AE instruction;Therapeutic activities;Visual/perceptual  remediation/compensation;Patient/family education;Balance training    OT Goals(Current goals can be found in the care plan section) Acute Rehab OT Goals Patient Stated Goal: to get better OT Goal Formulation: With patient Time For Goal Achievement: 11/12/16 Potential to Achieve Goals: Good ADL Goals Pt Will Perform Grooming: with set-up;with supervision;sitting Pt Will Perform Upper Body Bathing: with set-up;with supervision;sitting Pt Will Transfer to Toilet: with min assist;bedside commode;squat pivot transfer (drop arm commode) Pt/caregiver will Perform Home Exercise Program: Right Upper extremity;With minimal assist;With written HEP provided (selROM)  OT Frequency: Min 3X/week   Barriers to D/C:            Co-evaluation              AM-PAC PT "6 Clicks" Daily Activity     Outcome Measure Help from another person eating meals?: None Help from another person taking care of personal grooming?: A Little Help from another person toileting, which includes using toliet, bedpan, or urinal?: A Lot Help from another person bathing (including washing, rinsing, drying)?: A Lot Help from another person to put on and taking off regular upper body clothing?: A Lot Help from another person to put on and taking off regular lower body clothing?: A Lot 6 Click Score: 15   End of Session Equipment Utilized During Treatment: Gait belt Nurse Communication: Mobility status;Need for lift equipment Antony Salmon)  Activity Tolerance: Patient tolerated treatment well Patient left: in bed;with call bell/phone within reach;with bed alarm set  OT  Visit Diagnosis: Other abnormalities of gait and mobility (R26.89);Muscle weakness (generalized) (M62.81);Low vision, both eyes (H54.2);Hemiplegia and hemiparesis Hemiplegia - Right/Left: Right Hemiplegia - dominant/non-dominant: Dominant Hemiplegia - caused by: Cerebral infarction                Time: [id number] OT Time Calculation (min): 38 min Charges:   OT General Charges $OT Visit: 1 Visit OT Evaluation $OT Eval High Complexity: 1 High OT Treatments $Self Care/Home Management : 23-37 mins G-Codes:     [facility] Tessia Kassin, OT/L  [phone] 10/29/2016  Jordan Thompson,HILLARY 10/29/2016, 4:02 PM

## 2016-10-30 DIAGNOSIS — R7303 Prediabetes: Secondary | ICD-10-CM

## 2016-10-30 DIAGNOSIS — D62 Acute posthemorrhagic anemia: Secondary | ICD-10-CM

## 2016-10-30 DIAGNOSIS — Z8673 Personal history of transient ischemic attack (TIA), and cerebral infarction without residual deficits: Secondary | ICD-10-CM

## 2016-10-30 DIAGNOSIS — E785 Hyperlipidemia, unspecified: Secondary | ICD-10-CM

## 2016-10-30 DIAGNOSIS — K219 Gastro-esophageal reflux disease without esophagitis: Secondary | ICD-10-CM

## 2016-10-30 DIAGNOSIS — D72829 Elevated white blood cell count, unspecified: Secondary | ICD-10-CM

## 2016-10-30 DIAGNOSIS — I10 Essential (primary) hypertension: Secondary | ICD-10-CM

## 2016-10-30 DIAGNOSIS — K7030 Alcoholic cirrhosis of liver without ascites: Secondary | ICD-10-CM

## 2016-10-30 LAB — HEPARIN LEVEL (UNFRACTIONATED)
HEPARIN UNFRACTIONATED: 0.41 [IU]/mL (ref 0.30–0.70)
Heparin Unfractionated: 0.23 IU/mL — ABNORMAL LOW (ref 0.30–0.70)
Heparin Unfractionated: 0.34 IU/mL (ref 0.30–0.70)

## 2016-10-30 LAB — BASIC METABOLIC PANEL
ANION GAP: 8 (ref 5–15)
BUN: <5 mg/dL — ABNORMAL LOW (ref 6–20)
CALCIUM: 8.7 mg/dL — AB (ref 8.9–10.3)
CO2: 24 mmol/L (ref 22–32)
Chloride: 106 mmol/L (ref 101–111)
Creatinine, Ser: 0.69 mg/dL (ref 0.61–1.24)
GLUCOSE: 107 mg/dL — AB (ref 65–99)
POTASSIUM: 3.9 mmol/L (ref 3.5–5.1)
Sodium: 138 mmol/L (ref 135–145)

## 2016-10-30 LAB — CBC
HEMATOCRIT: 36.3 % — AB (ref 39.0–52.0)
HEMOGLOBIN: 11.3 g/dL — AB (ref 13.0–17.0)
MCH: 26.7 pg (ref 26.0–34.0)
MCHC: 31.1 g/dL (ref 30.0–36.0)
MCV: 85.8 fL (ref 78.0–100.0)
Platelets: 219 10*3/uL (ref 150–400)
RBC: 4.23 MIL/uL (ref 4.22–5.81)
RDW: 19.2 % — AB (ref 11.5–15.5)
WBC: 12.2 10*3/uL — ABNORMAL HIGH (ref 4.0–10.5)

## 2016-10-30 MED ORDER — PANTOPRAZOLE SODIUM 40 MG PO TBEC
40.0000 mg | DELAYED_RELEASE_TABLET | Freq: Every morning | ORAL | Status: DC
  Administered 2016-10-31 – 2016-11-01 (×2): 40 mg via ORAL
  Filled 2016-10-30 (×2): qty 1

## 2016-10-30 MED ORDER — FUROSEMIDE 40 MG PO TABS
40.0000 mg | ORAL_TABLET | Freq: Every morning | ORAL | Status: DC
  Administered 2016-10-31 – 2016-11-01 (×2): 40 mg via ORAL
  Filled 2016-10-30 (×2): qty 1

## 2016-10-30 MED ORDER — LACTULOSE 10 GM/15ML PO SOLN
30.0000 g | Freq: Three times a day (TID) | ORAL | Status: DC
Start: 2016-10-30 — End: 2016-11-01
  Administered 2016-10-31: 30 g via ORAL
  Filled 2016-10-30 (×3): qty 45

## 2016-10-30 MED ORDER — SPIRONOLACTONE 25 MG PO TABS
25.0000 mg | ORAL_TABLET | Freq: Every morning | ORAL | Status: DC
Start: 2016-10-31 — End: 2016-11-01
  Administered 2016-10-31 – 2016-11-01 (×2): 25 mg via ORAL
  Filled 2016-10-30 (×2): qty 1

## 2016-10-30 NOTE — Progress Notes (Signed)
ANTICOAGULATION CONSULT NOTE   Pharmacy Consult for heparin Indication: stroke  Allergies  Allergen Reactions  . Aleve [Naproxen Sodium] Other (See Comments)    Pt will not take  . Penicillins Nausea And Vomiting and Other (See Comments)    Pt states had a rxn as a child Has patient had a PCN reaction causing immediate rash, facial/tongue/throat swelling, SOB or lightheadedness with hypotension: No Has patient had a PCN reaction causing severe rash involving mucus membranes or skin necrosis: No Has patient had a PCN reaction that required hospitalization: No Has patient had a PCN reaction occurring within the last 10 years: No If all of the above answers are "NO", then may proceed with Cephalosporin use.  . Pepto-Bismol  [Bismuth Subsalicylate] Nausea Only    Patient Measurements: Height: 5\' 10"  (177.8 cm) Weight: 215 lb 6.2 oz (97.7 kg) IBW/kg (Calculated) : 73 Heparin Dosing Weight: 93kg  Vital Signs: Temp: 98.5 F (36.9 C) (09/01 0000) Temp Source: Oral (09/01 0000) BP: 130/83 (09/01 0300) Pulse Rate: 71 (09/01 0300)  Labs:  Recent Labs  10/28/16 1043  10/29/16 0220 10/29/16 0719 10/29/16 1002 10/29/16 2001 10/30/16 0242  HGB 12.1*  --  11.5*  --   --   --  11.3*  HCT 38.7*  --  37.5*  --   --   --  36.3*  PLT 242  --  220  --   --   --  219  LABPROT  --   --   --  15.0  --   --   --   INR  --   --   --  1.19  --   --   --   HEPARINUNFRC  --   < >  --   --  <0.10* 0.15* 0.23*  CREATININE 0.72  --   --  0.70  --   --  0.69  < > = values in this interval not displayed.  Estimated Creatinine Clearance: 128.1 mL/min (by C-G formula based on SCr of 0.69 mg/dL).  Assessment: 51 yo male with new onset stroke and left vertebral artery thrombus for heparin Goal of Therapy:  Heparin level 0.3-0.5 units/ml Monitor platelets by anticoagulation protocol: Yes   Plan:  .Increase Heparin 2100 units/hr Check heparin level in 8 hours.   Allye Hoyos, Gary FleetGregory  Vernon 10/30/2016,3:50 AM

## 2016-10-30 NOTE — Progress Notes (Addendum)
ANTICOAGULATION CONSULT NOTE   Pharmacy Consult for heparin Indication: stroke  Allergies  Allergen Reactions  . Aleve [Naproxen Sodium] Other (See Comments)    Pt will not take  . Penicillins Nausea And Vomiting and Other (See Comments)    Pt states had a rxn as a child Has patient had a PCN reaction causing immediate rash, facial/tongue/throat swelling, SOB or lightheadedness with hypotension: No Has patient had a PCN reaction causing severe rash involving mucus membranes or skin necrosis: No Has patient had a PCN reaction that required hospitalization: No Has patient had a PCN reaction occurring within the last 10 years: No If all of the above answers are "NO", then may proceed with Cephalosporin use.  . Pepto-Bismol  [Bismuth Subsalicylate] Nausea Only    Patient Measurements: Height: 5\' 10"  (177.8 cm) Weight: 215 lb 6.2 oz (97.7 kg) IBW/kg (Calculated) : 73 Heparin Dosing Weight: 93kg  Vital Signs: Temp: 98.2 F (36.8 C) (09/01 2013) Temp Source: Oral (09/01 2013) BP: 124/93 (09/01 2013) Pulse Rate: 78 (09/01 2013)  Labs:  Recent Labs  10/28/16 1043  10/29/16 0220 10/29/16 0719  10/30/16 0242 10/30/16 1225 10/30/16 2006  HGB 12.1*  --  11.5*  --   --  11.3*  --   --   HCT 38.7*  --  37.5*  --   --  36.3*  --   --   PLT 242  --  220  --   --  219  --   --   LABPROT  --   --   --  15.0  --   --   --   --   INR  --   --   --  1.19  --   --   --   --   HEPARINUNFRC  --   < >  --   --   < > 0.23* 0.41 0.34  CREATININE 0.72  --   --  0.70  --  0.69  --   --   < > = values in this interval not displayed.  Estimated Creatinine Clearance: 128.1 mL/min (by C-G formula based on SCr of 0.69 mg/dL).  Medications:  Infusions:  . sodium chloride 75 mL/hr at 10/30/16 1800  . heparin 2,100 Units/hr (10/30/16 1800)    Assessment: 51 yom transferred from [facility] for stroke work up. Pt with new onset stroke and occluded left vertebral artery and thrombus extending into  the proximal vertebrobasilar junction. Started on IV heparin per neuro recommendations. Initial  Heparin level was therapeutic at 0.41 on 2100 units/hr. H/H low stable. Plt wnl Second  8 hour heparin level 0.34, remains therapeutic on same heparin rate.  No s/s of bleeding noted.   Goal of Therapy:  Heparin level 0.3-0.5 units/ml Monitor platelets by anticoagulation protocol: Yes   Plan:  Continue IV heparin at 2100 units/hr  Daily heparin level and CBC  Noah Delaineuth Tionne Carelli, RPh Clinical Pharmacist Pager: [phone] 10/30/2016 8:59 PM

## 2016-10-30 NOTE — Progress Notes (Signed)
ANTICOAGULATION CONSULT NOTE   Pharmacy Consult for heparin Indication: stroke  Allergies  Allergen Reactions  . Aleve [Naproxen Sodium] Other (See Comments)    Pt will not take  . Penicillins Nausea And Vomiting and Other (See Comments)    Pt states had a rxn as a child Has patient had a PCN reaction causing immediate rash, facial/tongue/throat swelling, SOB or lightheadedness with hypotension: No Has patient had a PCN reaction causing severe rash involving mucus membranes or skin necrosis: No Has patient had a PCN reaction that required hospitalization: No Has patient had a PCN reaction occurring within the last 10 years: No If all of the above answers are "NO", then may proceed with Cephalosporin use.  . Pepto-Bismol  [Bismuth Subsalicylate] Nausea Only    Patient Measurements: Height: 5\' 10"  (177.8 cm) Weight: 215 lb 6.2 oz (97.7 kg) IBW/kg (Calculated) : 73 Heparin Dosing Weight: 93kg  Vital Signs: Temp: 98.1 F (36.7 C) (09/01 1200) Temp Source: Oral (09/01 1200) BP: 137/102 (09/01 1200) Pulse Rate: 63 (09/01 1200)  Labs:  Recent Labs  10/28/16 1043  10/29/16 0220 10/29/16 0719  10/29/16 2001 10/30/16 0242 10/30/16 1225  HGB 12.1*  --  11.5*  --   --   --  11.3*  --   HCT 38.7*  --  37.5*  --   --   --  36.3*  --   PLT 242  --  220  --   --   --  219  --   LABPROT  --   --   --  15.0  --   --   --   --   INR  --   --   --  1.19  --   --   --   --   HEPARINUNFRC  --   < >  --   --   < > 0.15* 0.23* 0.41  CREATININE 0.72  --   --  0.70  --   --  0.69  --   < > = values in this interval not displayed.  Estimated Creatinine Clearance: 128.1 mL/min (by C-G formula based on SCr of 0.69 mg/dL).  Medications:  Infusions:  . sodium chloride 75 mL/hr at 10/30/16 1200  . heparin 2,100 Units/hr (10/30/16 1200)    Assessment: 51 yom [place] [facility]. Pt with new onset stroke and occluded left vertebral artery and thrombus extending into  the proximal vertebrobasilar junction. Started on IV heparin per neuro recommendations. Heparin level now therapeutic. H/H low stable. Plt wnl RN reports no s/s of bleeding   Goal of Therapy:  Heparin level 0.3-0.5 units/ml Monitor platelets by anticoagulation protocol: Yes   Plan:  Continue IV heparin at 2100 units/hr  Check an 8 hr heparin level Daily heparin level and CBC  Vinnie LevelBenjamin Avalon Coppinger, PharmD., BCPS Clinical Pharmacist Pager [phone]

## 2016-10-30 NOTE — Consult Note (Signed)
Physical Medicine and Rehabilitation Consult Reason for Consult: Stroke Referring Physician: Dr. Roda Shutters   HPI: Jordan Thompson is a 51 y.o. 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 History taken from chart review and patient. He was transferred to [facility] further workup. Neurology was consulted and stroke workup initiated. MRI brain performed on 8/30 reviewed, showing left medullary infarct. Per report, acute infarct in left paramedian medullary, acute and subacute bilateral PICA territory, left subacute to chronic left medullary infarct. 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 Neurology recommended transition to DPT for 3 months and Plavix alone after stable on heparin. Patient with resulting right-sided weakness. He did not receive TPA. Hospital course complicated by leukocytosis, acute blood loss anemia. He used a rolling walker PTA.  Lives with his wife who works during the day.    Review of Systems  Neurological: Positive for sensory change and focal weakness.  All other systems reviewed and are negative.  Past Medical History:  Diagnosis Date  . Cirrhosis of liver (HCC)   . CVA (cerebral vascular accident) (HCC)   . Hypercholesterolemia   . Hypertension    No past surgical history on file. Family History  Problem Relation Age of Onset  . Hypertension Mother   . Hypertension Father    Social History:  reports that he has never smoked. He has never used smokeless tobacco. He reports that he drinks alcohol. He reports that he does not use drugs. Allergies:  Allergies  Allergen Reactions  . Aleve [Naproxen Sodium] Other (See Comments)    Pt will not take  . Penicillins Nausea And Vomiting and Other (See Comments)    Pt states had a rxn as a child Has patient had a PCN  reaction causing immediate rash, facial/tongue/throat swelling, SOB or lightheadedness with hypotension: No Has patient had a PCN reaction causing severe rash involving mucus membranes or skin necrosis: No Has patient had a PCN reaction that required hospitalization: No Has patient had a PCN reaction occurring within the last 10 years: No If all of the above answers are "NO", then may proceed with Cephalosporin use.  . Pepto-Bismol  [Bismuth Subsalicylate] Nausea Only   Medications Prior to Admission  Medication Sig Dispense Refill  . aspirin 81 MG chewable tablet Chew 81 mg by mouth daily.     Marland Kitchen atorvastatin (LIPITOR) 40 MG tablet Take 40 mg by mouth daily at 6 PM.    . carvedilol (COREG) 3.125 MG tablet Take 3.125 mg by mouth 2 (two) times daily with a meal.    . furosemide (LASIX) 40 MG tablet Take 40 mg by mouth every morning.    . lactulose (CHRONULAC) 10 GM/15ML solution Take 30 g by mouth 3 (three) times daily.    . Multiple Vitamins-Minerals (MULTIVITAMIN PO) Take 1 tablet by mouth daily.    . pantoprazole (PROTONIX) 40 MG tablet Take 40 mg by mouth every morning.    Marland Kitchen spironolactone (ALDACTONE) 25 MG tablet Take 25 mg by mouth every morning.      Home: Home Living Family/patient expects to be discharged to:: Inpatient rehab Living Arrangements: Spouse/significant other Available Help at Discharge: Family Type of Home: House Home Access: Level entry Home Layout: Two level, Bed/bath upstairs Alternate Level Stairs-Number of Steps: 13 Alternate Level Stairs-Rails: Left Bathroom Shower/Tub: Health visitor: Standard Home Equipment:  Walker - 2 wheels, Grab bars - tub/shower  Lives With: Spouse  Functional History: Prior Function Level of Independence: Independent with assistive device(s) Comments: wife assisted with entry and exit from shower; using walker since last stroke and doing "his own PT with weights and exercises given by his neice who is a Engineer, civil (consulting) in  PT" Functional Status:  Mobility: Bed Mobility Overal bed mobility: Needs Assistance Bed Mobility: Rolling, Sidelying to Sit Rolling: Min assist Sidelying to sit: Mod assist, +2 for safety/equipment Sit to supine: Max assist General bed mobility comments: assist and cues for hand placement coming upright, assist for legs and trunk Transfers Overall transfer level: Needs assistance Equipment used: 2 person hand held assist Transfer via Lift Equipment: Stedy Transfers: Sit to/from Stand, Systems analyst Sit to Stand: Max assist, +2 physical assistance Stand pivot transfers:  (required [place] eo fstedy) Squat pivot transfers: Max assist General transfer comment: assist to block R LE and for standing to facilitate hip extension, stood second attempt with chair back on L side to facilitate L weight shift; pivot to chair with cues for hand placement and L weight shift, lifting help to get to chair Ambulation/Gait General Gait Details: unable    ADL: ADL Overall ADL's : Needs assistance/impaired Eating/Feeding: Set up Grooming: Minimal assistance Upper Body Bathing: Moderate assistance, Sitting Lower Body Bathing: Moderate assistance, Sit to/from stand Upper Body Dressing : Moderate assistance Lower Body Dressing: Maximal assistance, Sit to/from stand Toilet Transfer: +2 for physical assistance, Maximal assistance (required use of Stedy) Toileting- Clothing Manipulation and Hygiene: Maximal assistance Toileting - Clothing Manipulation Details (indicate cue type and reason): once sitting on BSC, pt able to scoot hips back onto toilet; Able to lean R to attempt hygiene after toielting with min A Functional mobility during ADLs: Maximal assistance, +2 for physical assistance General ADL Comments: Pt attempting to trnasfer to toilet. Pt unable to maintain weight through RLE and required use of Stedy to safely transfer to [facility]  Cognition: Cognition Overall Cognitive Status: No  family/caregiver present to determine baseline cognitive functioning Arousal/Alertness: Awake/alert Orientation Level: Oriented X4 Attention: Alternating Alternating Attention: Appears intact Memory: Appears intact Awareness: Appears intact Problem Solving: Appears intact Safety/Judgment: Appears intact Cognition Arousal/Alertness: Awake/alert Behavior During Therapy: WFL for tasks assessed/performed Overall Cognitive Status: No family/caregiver present to determine baseline cognitive functioning General Comments: will further assess  Blood pressure (!) 136/97, pulse 74, temperature 97.9 F (36.6 C), temperature source Oral, resp. rate 12, height 5\' 10"  (1.778 m), weight 97.7 kg (215 lb 6.2 oz), SpO2 99 %. Physical Exam  Vitals reviewed. Constitutional: He is oriented to person, place, and time. He appears well-developed.  Obese  HENT:  Head: Normocephalic and atraumatic.  Eyes: Right eye exhibits no discharge. Left eye exhibits no discharge.  Neck: Normal range of motion. Neck supple.  Cardiovascular: Normal rate and regular rhythm.   Respiratory: Effort normal and breath sounds normal.  GI: Soft. Bowel sounds are normal.  Musculoskeletal: He exhibits no edema or tenderness.  Neurological: He is alert and oriented to person, place, and time.  Motor: LUE/LLE: 5/5 proximal to dsital RLUE/RLE: 0/5 proximal to distal No increase in tone DTRs 3+ LUE/LLE Sensation diminished to light touch RUE/RLE  Skin: Skin is warm and dry.  Psychiatric: He has a normal mood and affect. His behavior is normal.    Results for orders placed or performed during the hospital encounter of 10/28/16 (from the past 24 hour(s))  Heparin level (unfractionated)  Status: Abnormal   Collection Time: 10/29/16  8:01 PM  Result Value Ref Range   Heparin Unfractionated 0.15 (L) 0.30 - 0.70 IU/mL  CBC     Status: Abnormal   Collection Time: 10/30/16  2:42 AM  Result Value Ref Range   WBC 12.2 (H) 4.0 -  10.5 K/uL   RBC 4.23 4.22 - 5.81 MIL/uL   Hemoglobin 11.3 (L) 13.0 - 17.0 g/dL   HCT 16.136.3 (L) 09.639.0 - 04.552.0 %   MCV 85.8 78.0 - 100.0 fL   MCH 26.7 26.0 - 34.0 pg   MCHC 31.1 30.0 - 36.0 g/dL   RDW 40.919.2 (H) 81.111.5 - 91.415.5 %   Platelets 219 150 - 400 K/uL  Basic metabolic panel     Status: Abnormal   Collection Time: 10/30/16  2:42 AM  Result Value Ref Range   Sodium 138 135 - 145 mmol/L   Potassium 3.9 3.5 - 5.1 mmol/L   Chloride 106 101 - 111 mmol/L   CO2 24 22 - 32 mmol/L   Glucose, Bld 107 (H) 65 - 99 mg/dL   BUN <5 (L) 6 - 20 mg/dL   Creatinine, Ser 7.820.69 0.61 - 1.24 mg/dL   Calcium 8.7 (L) 8.9 - 10.3 mg/dL   GFR calc non Af Amer >60 >60 mL/min   GFR calc Af Amer >60 >60 mL/min   Anion gap 8 5 - 15  Heparin level (unfractionated)     Status: Abnormal   Collection Time: 10/30/16  2:42 AM  Result Value Ref Range   Heparin Unfractionated 0.23 (L) 0.30 - 0.70 IU/mL   [name] Contrast  Result Date: 10/28/2016 CLINICAL DATA:  Right arm and leg weakness with numbness. Dysarthria. Left vertebral artery occlusion on CTA with thrombus at the vertebrobasilar junction. EXAM: MRI HEAD WITHOUT CONTRAST TECHNIQUE: Multiplanar, multiecho pulse sequences of the brain and surrounding structures were obtained without intravenous contrast. COMPARISON:  Head CT 10/27/2016.  Head and neck CTA 10/28/2016. FINDINGS: Brain: There is an acute infarct in the left paramedian medulla measuring 14 x 7 mm. There are patchy acute to early subacute infarcts in the right greater than left cerebellar hemispheres involving the PICA territories, with the largest confluent infarct measuring 3 cm on the right. A moderately large left PICA territory infarct has more of a late subacute appearance with some early volume loss noted. A similar but smaller late subacute PICA territory infarct is noted on the right. A small amount of chronic petechial blood products are present in these and medial left cerebellum. There is a  small late subacute to chronic left posterolateral medullary infarct. No supratentorial infarct is identified. There is mild cerebral atrophy. Scattered punctate foci of T2 hyperintensity in the cerebral white matter bilaterally are nonspecific but compatible with minimal chronic small vessel ischemic disease. No mass, midline shift, or extra-axial fluid collection is present. Vascular: Occluded distal left vertebral artery. Other major intracranial vascular flow voids are preserved. Skull and upper cervical spine: Unremarkable bone marrow signal. Sinuses/Orbits: Unremarkable orbits. Paranasal sinuses and mastoid air cells are clear. Other: None. IMPRESSION: 1. Acute infarct in the left paramedian medulla. 2. Acute and subacute bilateral PICA territory infarcts as above. 3. Late subacute to chronic left medullary infarct. Electronically Signed   By: Sebastian AcheAllen  Grady M.D.   On: 10/28/2016 15:00    Assessment/Plan: Diagnosis: Left medullary infarct, bilateral cerebellar infarcts (transition from heparin ggt when appropriate) Labs and images independently reviewed.  Records reviewed and summated above. Stroke:  Continue secondary stroke prophylaxis and Risk Factor Modification listed below:   Antiplatelet therapy:   Blood Pressure Management:  Continue current medication with prn's with permisive HTN per primary team Statin Agent:   Prediabetes management:   Right sided hemiparesis: fit for orthosis to prevent contractures (resting hand splint for day, wrist cock up splint at night, PRAFO, etc) Motor recovery: Fluoxetine  1. Does the need for close, 24 hr/day medical supervision in concert with the patient's rehab needs make it unreasonable for this patient to be served in a less intensive setting? Yes 2. Co-Morbidities requiring supervision/potential complications: leukocytosis (cont to monitor for signs and symptoms of infection, further workup if indicated), acute blood loss anemia (transfuse if necessary  to ensure appropriate perfusion for increased activity tolerance), HTN (monitor and provide prns in accordance with increased physical exertion and pain),, hypercholesterolemia (cont meds), alcoholic cirrhosis (avoid hepatotoxic meds), CVA 8/18 without residual, prediabetes (Monitor in accordance with exercise and adjust meds as necessary) 3. Due to safety, disease management and patient education, does the patient require 24 hr/day rehab nursing? Yes 4. Does the patient require coordinated care of a physician, rehab nurse, PT (1-2 hrs/day, 5 days/week) and OT (1-2 hrs/day, 5 days/week) to address physical and functional deficits in the context of the above medical diagnosis(es)? Yes Addressing deficits in the following areas: balance, endurance, locomotion, strength, transferring, bathing, dressing, toileting and psychosocial support 5. Can the patient actively participate in an intensive therapy program of at least 3 hrs of therapy per day at least 5 days per week? Yes 6. The potential for patient to make measurable gains while on inpatient rehab is excellent 7. Anticipated functional outcomes upon discharge from inpatient rehab are min assist and mod assist  with PT, min assist and mod assist with OT, n/a with SLP. 8. Estimated rehab length of stay to reach the above functional goals is: 19-23 days. 9. Does the patient have adequate social supports and living environment to accommodate these discharge functional goals? No 10. Anticipated D/C setting: Other 11. Anticipated post D/C treatments: SNF 12. Overall Rehab/Functional Prognosis: good  RECOMMENDATIONS: This patient's condition is appropriate for continued rehabilitative care in the following setting: CIR Patient has agreed to participate in recommended program. Yes Note that insurance prior authorization may be required for reimbursement for recommended care.  Comment: Rehab Admissions Coordinator to follow up.  Maryla Morrow, MD,  [place] 10/30/2016

## 2016-10-30 NOTE — Progress Notes (Signed)
STROKE TEAM PROGRESS NOTE   HISTORY OF PRESENT ILLNESS (per record)  Jordan Thompson is an 51 y.o. male who was transferred to [facility] from [facility] for further stroke workup. Patient recently was diagnosed with cirrhosis due to alcoholism to achieve recently quit 74 days ago. He also recently had a CVA on August 8 of 18. At that time his symptoms were left-sided weakness and dysphagia which had fully resolved. Patient presented to [facility] secondary to on onset of right arm weakness and right leg weakness along with numbness. Patient also endorsed some difficulty with walking but no difficulty with swallowing. On exam and was noted that he also had dysarthria. 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 She was transferred to [facility] for further evaluation. Just prior to being transfer patient was given a load of 300 mg of Plavix.  In early august, had LeftPICA Ischemic Stroke   Date last known well: Date: 10/27/2016 Time last known well: Time: 06:30 tPA Given: No: Recent CVA Modified Rankin: Rankin Score=0 NIH stroke scale 11   Patient was not administered IV t-PA secondary to recent cva. He was admitted to the neuro ICU for further evaluation and treatment.   SUBJECTIVE (INTERVAL HISTORY) No acute events overnight. Pt feels his slurry speech getting better, hoarseness getting better. On heparin drip but level not detectable. Will give one time dose ASA today. Continue heparin.    OBJECTIVE Temp:  [97.9 F (36.6 C)-98.6 F (37 C)] 98 F (36.7 C) (09/01 0400) Pulse Rate:  [62-80] 62 (09/01 0700) Cardiac Rhythm: Normal sinus rhythm (08/31 2000) Resp:  [10-20] 10 (09/01 0700) BP: (111-153)/(73-104) 133/82 (09/01 0700) SpO2:  [95 %-99 %] 96 % (09/01 0700)  Examination:  General -  Well nourished, well developed, in no apparent distress.   Cardiovascular - Regular rate and rhythm. Lungs: CTA  Mental Status -  Level of arousal and orientation to time, place, and person were intact. Language including expression, naming, repetition, comprehension was assessed and found intact. Fund of Knowledge was assessed and was intact.  Cranial Nerves II - XII - II - Visual field intact OU. III, IV, VI - Extraocular movements intact, PERRL, left mild ptosis. V - Facial sensation intact bilaterally. VII - right nasolabial fold flattening VIII - Hearing & vestibular intact bilaterally, left end-gaze nystagmus. X - Palate elevates symmetrically, mild dysarthria and hoarseness. XI - Chin turning & shoulder shrug intact bilaterally. XII - Tongue protrusion intact.  Motor Strength - The patient's strength was normal in LUE and LLE, but RUE 0/5 and RLE 1/5.  Bulk was normal and fasciculations were absent.   Motor Tone - Muscle tone was assessed at the neck and appendages and was normal.  Reflexes - The patient's reflexes were 1+ in all extremities and right babinski and triple reflex positive     CBC:      Recent Labs Lab 10/29/16 0220 10/30/16 0242  WBC 11.5* 12.2*  HGB 11.5* 11.3*  HCT 37.5* 36.3*  MCV 87.0 85.8  PLT 220 219    Basic Metabolic Panel:   Recent Labs Lab 10/29/16 0719 10/30/16 0242  NA 138 138  K 4.0 3.9  CL 104 106  CO2 25 24  GLUCOSE 115* 107*  BUN <5* <5*  CREATININE 0.70 0.69  CALCIUM 9.0 8.7*    Lipid Panel:  Component Value Date/Time   CHOL 88 10/29/2016 0220   TRIG 104 10/29/2016 0220   HDL 17 (L) 10/29/2016 0220   CHOLHDL 5.2 10/29/2016 0220   VLDL 21 10/29/2016 0220   LDLCALC 50 10/29/2016 0220   HgbA1c:  Lab Results  Component Value Date   HGBA1C 6.1 (H) 10/29/2016   Urine Drug Screen:     Component Value Date/Time   LABOPIA NONE DETECTED 10/29/2016 0853   COCAINSCRNUR NONE DETECTED 10/29/2016 0853    LABBENZ POSITIVE (A) 10/29/2016 0853   AMPHETMU NONE DETECTED 10/29/2016 0853   THCU NONE DETECTED 10/29/2016 0853   LABBARB NONE DETECTED 10/29/2016 0853    Alcohol Level No results found for: ETH  IMAGING I have personally reviewed the radiological images below and agree with the radiology interpretations.  [name] Contrast 10/28/2016 IMPRESSION: 1. Acute infarct in the left paramedian medulla. 2. Acute and subacute bilateral PICA territory infarcts as above. 3. Late subacute to chronic left medullary infarct.   CTA neck  - left VA occlusion with thready reconstitution in the neck - atherosclerosis without hemodynamically significant stenosis of the carotid arteries.  - severe right C6-7 neural foraminal narrowing  CTA head - occluded left VA and thrombus extending into central VBJ, at risk of embolization - no anterior cirdulation LVO or significant stenosis  TTE - Left ventricle: The cavity size was normal. There was mild   concentric hypertrophy. Systolic function was normal. The   estimated ejection fraction was in the range of 60% to 65%. Wall   motion was normal; there were no regional wall motion   abnormalities. Doppler parameters are consistent with abnormal   left ventricular relaxation (grade 1 diastolic dysfunction). - Aortic valve: Transvalvular velocity was within the normal range.   There was no stenosis. There was no regurgitation. - Aorta: Ascending aortic diameter: 41 mm (S). - Ascending aorta: The ascending aorta was mildly dilated. - Mitral valve: Transvalvular velocity was within the normal range.   There was no evidence for stenosis. There was no regurgitation. - Right ventricle: The cavity size was normal. Wall thickness was   normal. Systolic function was normal. - Tricuspid valve: There was no regurgitation.   PHYSICAL EXAM   Vitals:   10/30/16 0800 10/30/16 0900 10/30/16 1000 10/30/16 1100  BP: 128/86 (!) 142/93 (!) 147/110 (!) 136/97   Pulse: 64 72 73 74  Resp: 10 12 13 12   Temp:  97.9 F (36.6 C)    TempSrc:  Oral    SpO2: 99% 100% 99% 99%  Weight:      Height:         General - Well nourished, well developed, in no apparent distress.  Ophthalmologic - Fundi not visualized due to .  Cardiovascular - Regular rate and rhythm.  Mental Status -  Level of arousal and orientation to time, place, and person were intact. Language including expression, naming, repetition, comprehension was assessed and found intact. Fund of Knowledge was assessed and was intact.  Cranial Nerves II - XII - II - Visual field intact OU. III, IV, VI - Extraocular movements intact, PERRL, left mild ptosis. V - Facial sensation intact bilaterally. VII - right nasolabial fold flattening VIII - Hearing & vestibular intact bilaterally, left end-gaze nystagmus. X - Palate elevates symmetrically, mild dysarthria and hoarseness. XI - Chin turning & shoulder shrug intact bilaterally. XII - Tongue protrusion intact.  Motor Strength - The patient's strength was normal in LUE and LLE, but RUE 0/5  and RLE 1/5.  Bulk was normal and fasciculations were absent.   Motor Tone - Muscle tone was assessed at the neck and appendages and was normal.  Reflexes - The patient's reflexes were 1+ in all extremities and right babinski and triple reflex positive  Sensory - Light touch, temperature/pinprick were assessed and were decreased on the right, about 60-80% of the left.    Coordination - The patient had mild dysmetria at left hands with left FTN.  Tremor was absent.  Gait and Station - not tested.  ASSESSMENT/PLAN Mr. Antwain Caliendo is a 51 y.o. male with history of recent left PICA stroke, long term alcohol use disorder leading to cirrhosis and hepatitis presenting with . He did not receive IV t-PA due recent stroke .   Stroke - acute right cerebellar PICA territory and left paramedian medulla infarcts, likely due to left VA  dissection/occlusion with thrombus extension to VBJ. presumebaly left VA also supplies part of the right PICA territory.   Resultant: right sided weakness  MRI head: Acute infarct in the left paramedian medulla. Acute and subacute bilateral PICA territory infarcts as above. Late subacute to chronic left medullary infarct.  CTA head and neck - left VA occlusion with thrombus extension to VBJ  2D Echo  EF 60-65% no valvular problems   LDL 50  HgbA1c 6.1  Heparin gtt for VTE prophylaxis Diet Heart Room service appropriate? Yes; Fluid consistency: Thin  aspirin 81 mg daily prior to admission, now on heparin IV. Once stable on heparin IV, consider to transition to DAPT for 3 months and then plavix alone for stroke prevention  Patient counseled to be compliant with his antithrombotic medications  Ongoing aggressive stroke risk factor management  Therapy recommendations:  CIR  Disposition:  Pending  Recent stroke 10/06/16  Left PICA and left medullary infarcts - due to left VA occlusion  Dysphagia resolved over time  On ASA and lipitor PTA  Liver cirrhosis   Alcohol related  Normal INR and ammonia level  On B1/FA/MVI  Hypertension  Stable  Permissive hypertension (OK if < 180/105) but gradually normalize in 5-7 days  Long-term BP goal normotensive  Hyperlipidemia  Home meds:  Atorvastatin 40  LDL 50, goal < 70  Continue statin at discharge  Diabetes  HgbA1c 6.1, goal < 7.0  Controlled  Other Stroke Risk Factors  Advanced age  ETOH use, advised to stop drinking due to cirrhosis  Obesity, Body mass index is 30.91 kg/m., recommend weight loss, diet and exercise as appropriate   Other Active Problems    Hospital day # 2  This patient is critically ill due to recurrent stroke with thrombus at left VBJ, recent stroke, liver cirrhosis and at significant risk of neurological worsening, death form recurrent stroke, hemorrhagic transformation, bleeding,  hyperammonemia and liver failure. This patient's care requires constant monitoring of vital signs, hemodynamics, respiratory and cardiac monitoring, review of multiple databases, neurological assessment, discussion with family, other specialists and medical decision making of high complexity. I spent 40 minutes of neurocritical care time in the care of this patient.     To contact Stroke Continuity provider, please refer to [email or web address]. After hours, contact General Neurology

## 2016-10-30 NOTE — Progress Notes (Signed)
Rehab Admissions Coordinator Note:  Patient was screened by Trish MageLogue, Shakeia Krus M for appropriateness for an Inpatient Acute Rehab Consult.  At this time, we are recommending Inpatient Rehab consult.  Lelon FrohlichLogue, Rube Sanchez M 10/30/2016, 9:08 AM  I can be reached at [phone].

## 2016-10-30 NOTE — Progress Notes (Signed)
Report called and given to Silver PeakBernadette, RN Thersa SaltLeeAnne T. Ignatz Deis, RN, BSN 6:26 PM 10/30/2016

## 2016-10-31 LAB — CBC
HEMATOCRIT: 36.1 % — AB (ref 39.0–52.0)
Hemoglobin: 11.3 g/dL — ABNORMAL LOW (ref 13.0–17.0)
MCH: 26.5 pg (ref 26.0–34.0)
MCHC: 31.3 g/dL (ref 30.0–36.0)
MCV: 84.7 fL (ref 78.0–100.0)
PLATELETS: 229 10*3/uL (ref 150–400)
RBC: 4.26 MIL/uL (ref 4.22–5.81)
RDW: 19.2 % — AB (ref 11.5–15.5)
WBC: 12.1 10*3/uL — ABNORMAL HIGH (ref 4.0–10.5)

## 2016-10-31 LAB — HEPARIN LEVEL (UNFRACTIONATED): Heparin Unfractionated: 0.38 IU/mL (ref 0.30–0.70)

## 2016-10-31 LAB — BASIC METABOLIC PANEL
Anion gap: 9 (ref 5–15)
CALCIUM: 9 mg/dL (ref 8.9–10.3)
CO2: 23 mmol/L (ref 22–32)
Chloride: 107 mmol/L (ref 101–111)
Creatinine, Ser: 0.64 mg/dL (ref 0.61–1.24)
GFR calc Af Amer: >60 mL/min (ref >60)
GLUCOSE: 103 mg/dL — AB (ref 65–99)
Potassium: 3.7 mmol/L (ref 3.5–5.1)
Sodium: 139 mmol/L (ref 135–145)

## 2016-10-31 MED ORDER — CLOPIDOGREL BISULFATE 75 MG PO TABS
75.0000 mg | ORAL_TABLET | Freq: Every day | ORAL | Status: DC
  Administered 2016-10-31 – 2016-11-01 (×2): 75 mg via ORAL
  Filled 2016-10-31 (×2): qty 1

## 2016-10-31 MED ORDER — ASPIRIN EC 81 MG PO TBEC
81.0000 mg | DELAYED_RELEASE_TABLET | Freq: Every day | ORAL | Status: DC
  Administered 2016-10-31 – 2016-11-01 (×2): 81 mg via ORAL
  Filled 2016-10-31 (×2): qty 1

## 2016-10-31 NOTE — Progress Notes (Signed)
Patient had 3-4 drops blood coming out of urethra after urinating.  Denies pain or burning when urinating.  Text page MD.

## 2016-10-31 NOTE — Progress Notes (Signed)
STROKE TEAM PROGRESS NOTE   HISTORY OF PRESENT ILLNESS (per record)  Jordan Thompson is an 51 y.o. male who was transferred to [facility] from [facility] for further stroke workup. Patient recently was diagnosed with cirrhosis due to alcoholism to achieve recently quit 74 days ago. He also recently had a CVA on August 8 of 18. At that time his symptoms were left-sided weakness and dysphagia which had fully resolved. Patient presented to [facility] secondary to on onset of right arm weakness and right leg weakness along with numbness. Patient also endorsed some difficulty with walking but no difficulty with swallowing. On exam and was noted that he also had dysarthria. 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 She was transferred to [facility] for further evaluation. Just prior to being transfer patient was given a load of 300 mg of Plavix.  In early august, had LeftPICA Ischemic Stroke   Date last known well: Date: 10/27/2016 Time last known well: Time: 06:30 tPA Given: No: Recent CVA Modified Rankin: Rankin Score=0 NIH stroke scale 11   Patient was not administered IV t-PA secondary to recent cva. He was admitted to the neuro ICU for further evaluation and treatment.   SUBJECTIVE (INTERVAL HISTORY) No acute events overnight. Pt feels his slurry speech getting better, hoarseness getting better. Heparin gtt was stopped this AM, PT was started on ASA/Plavix  OBJECTIVE Temp:  [97.8 F (36.6 C)-98.7 F (37.1 C)] 98.7 F (37.1 C) (09/02 0000) Pulse Rate:  [63-80] 75 (09/02 0200) Cardiac Rhythm: Normal sinus rhythm (09/02 0702) Resp:  [10-18] 13 (09/02 0200) BP: (114-147)/(81-110) 114/81 (09/02 0200) SpO2:  [94 %-100 %] 95 % (09/02 0200)  Examination:  General - Well nourished, well  developed, in no apparent distress.   Cardiovascular - Regular rate and rhythm. Lungs: CTA Abdomen soft nt Mental Status -  Level of arousal and orientation to time, place, and person were intact. Language including expression, naming, repetition, comprehension was assessed and found intact. Fund of Knowledge was assessed and was intact.  Cranial Nerves II - XII - II - Visual field intact OU. III, IV, VI - Extraocular movements intact, PERRL, left mild ptosis. V - Facial sensation intact bilaterally. VII - right nasolabial fold flattening VIII - Hearing & vestibular intact bilaterally, left end-gaze nystagmus. X - Palate elevates symmetrically, mild dysarthria and hoarseness. XI - Chin turning & shoulder shrug intact bilaterally. XII - Tongue protrusion intact.  Motor Strength - The patient's strength was normal in LUE and LLE, but RUE 0/5 and RLE 1/5.  Bulk was normal and fasciculations were absent.   Motor Tone - Muscle tone was assessed at the neck and appendages and was normal.  Reflexes - The patient's reflexes were 1+ in all extremities and right babinski and triple reflex positive     CBC:      Recent Labs Lab 10/30/16 0242 10/31/16 0320  WBC 12.2* 12.1*  HGB 11.3* 11.3*  HCT 36.3* 36.1*  MCV 85.8 84.7  PLT 219 229    Basic Metabolic Panel:   Recent Labs Lab 10/30/16 0242 10/31/16 0320  NA 138 139  K 3.9 3.7  CL 106 107  CO2 24 23  GLUCOSE 107* 103*  BUN <5* <5*  CREATININE 0.69 0.64  CALCIUM 8.7* 9.0    Lipid Panel:     Component Value  Date/Time   CHOL 88 10/29/2016 0220   TRIG 104 10/29/2016 0220   HDL 17 (L) 10/29/2016 0220   CHOLHDL 5.2 10/29/2016 0220   VLDL 21 10/29/2016 0220   LDLCALC 50 10/29/2016 0220   HgbA1c:  Lab Results  Component Value Date   HGBA1C 6.1 (H) 10/29/2016   Urine Drug Screen:     Component Value Date/Time   LABOPIA NONE DETECTED 10/29/2016 0853   COCAINSCRNUR NONE DETECTED 10/29/2016 0853   LABBENZ  POSITIVE (A) 10/29/2016 0853   AMPHETMU NONE DETECTED 10/29/2016 0853   THCU NONE DETECTED 10/29/2016 0853   LABBARB NONE DETECTED 10/29/2016 0853    Alcohol Level No results found for: ETH  IMAGING I have personally reviewed the radiological images below and agree with the radiology interpretations.  [name] Contrast 10/28/2016 IMPRESSION: 1. Acute infarct in the left paramedian medulla. 2. Acute and subacute bilateral PICA territory infarcts as above. 3. Late subacute to chronic left medullary infarct.   CTA neck  - left VA occlusion with thready reconstitution in the neck - atherosclerosis without hemodynamically significant stenosis of the carotid arteries.  - severe right C6-7 neural foraminal narrowing  CTA head - occluded left VA and thrombus extending into central VBJ, at risk of embolization - no anterior cirdulation LVO or significant stenosis  TTE - Left ventricle: The cavity size was normal. There was mild   concentric hypertrophy. Systolic function was normal. The   estimated ejection fraction was in the range of 60% to 65%. Wall   motion was normal; there were no regional wall motion   abnormalities. Doppler parameters are consistent with abnormal   left ventricular relaxation (grade 1 diastolic dysfunction). - Aortic valve: Transvalvular velocity was within the normal range.   There was no stenosis. There was no regurgitation. - Aorta: Ascending aortic diameter: 41 mm (S). - Ascending aorta: The ascending aorta was mildly dilated. - Mitral valve: Transvalvular velocity was within the normal range.   There was no evidence for stenosis. There was no regurgitation. - Right ventricle: The cavity size was normal. Wall thickness was   normal. Systolic function was normal. - Tricuspid valve: There was no regurgitation.   PHYSICAL EXAM   Vitals:   10/30/16 1944 10/30/16 2013 10/31/16 0000 10/31/16 0200  BP:  (!) 124/93 (!) 131/95 114/81  Pulse:  78 75 75   Resp:  15 15 13   Temp: 98.4 F (36.9 C) 98.2 F (36.8 C) 98.7 F (37.1 C)   TempSrc: Oral Oral Oral   SpO2:   100% 95%  Weight:      Height:         General - Well nourished, well developed, in no apparent distress.  Ophthalmologic - Fundi not visualized due to .  Cardiovascular - Regular rate and rhythm.  Mental Status -  Level of arousal and orientation to time, place, and person were intact. Language including expression, naming, repetition, comprehension was assessed and found intact. Fund of Knowledge was assessed and was intact.  Cranial Nerves II - XII - II - Visual field intact OU. III, IV, VI - Extraocular movements intact, PERRL, left mild ptosis. V - Facial sensation intact bilaterally. VII - right nasolabial fold flattening VIII - Hearing & vestibular intact bilaterally, left end-gaze nystagmus. X - Palate elevates symmetrically, mild dysarthria and hoarseness. XI - Chin turning & shoulder shrug intact bilaterally. XII - Tongue protrusion intact.  Motor Strength - The patient's strength was normal in LUE and LLE,  but RUE 0/5 and RLE 1/5.  Bulk was normal and fasciculations were absent.   Motor Tone - Muscle tone was assessed at the neck and appendages and was normal.  Reflexes - The patient's reflexes were 1+ in all extremities and right babinski and triple reflex positive  Sensory - Light touch, temperature/pinprick were assessed and were decreased on the right, about 60-80% of the left.    Coordination - The patient had mild dysmetria at left hands with left FTN.  Tremor was absent.  Gait and Station - not tested.  ASSESSMENT/PLAN Mr. Jordan Thompson is a 51 y.o. male with history of recent left PICA stroke, long term alcohol use disorder leading to cirrhosis and hepatitis presenting with . He did not receive IV t-PA due recent stroke .   Stroke - acute right cerebellar PICA territory and left paramedian medulla infarcts, likely due to left VA  dissection/occlusion with thrombus extension to VBJ. presumebaly left VA also supplies part of the right PICA territory.   Resultant: right sided weakness  MRI head: Acute infarct in the left paramedian medulla. Acute and subacute bilateral PICA territory infarcts as above. Late subacute to chronic left medullary infarct.  CTA head and neck - left VA occlusion with thrombus extension to VBJ  2D Echo  EF 60-65% no valvular problems   LDL 50  HgbA1c 6.1  Heparin gtt for VTE prophylaxis Diet Heart Room service appropriate? Yes; Fluid consistency: Thin  aspirin 81 mg daily prior to admission, now on heparin IV. Heparin gtt was stopped this AM, ASA/Plavix was started.  Patient counseled to be compliant with his antithrombotic medications  Ongoing aggressive stroke risk factor management  Therapy recommendations:  CIR  Disposition:  Pending  Recent stroke 10/06/16  Left PICA and left medullary infarcts - due to left VA occlusion  Dysphagia resolved over time  On ASA and lipitor PTA  Liver cirrhosis   Alcohol related  Normal INR and ammonia level  On B1/FA/MVI  Hypertension  Stable  Permissive hypertension (OK if < 180/105) but gradually normalize in 5-7 days  Long-term BP goal normotensive  Hyperlipidemia  Home meds:  Atorvastatin 40  LDL 50, goal < 70  Continue statin at discharge  Diabetes  HgbA1c 6.1, goal < 7.0  Controlled  Other Stroke Risk Factors  Advanced age  ETOH use, advised to stop drinking due to cirrhosis  Obesity, Body mass index is 30.91 kg/m., recommend weight loss, diet and exercise as appropriate   Other Active Problems    Hospital day # 3  This patient is critically ill due to recurrent stroke with thrombus at left VBJ, recent stroke, liver cirrhosis and at significant risk of neurological worsening, death form recurrent stroke, hemorrhagic transformation, bleeding, hyperammonemia and liver failure. This patient's care requires  constant monitoring of vital signs, hemodynamics, respiratory and cardiac monitoring, review of multiple databases, neurological assessment, discussion with family, other specialists and medical decision making of high complexity. I spent 40 minutes of neurocritical care time in the care of this patient.     To contact Stroke Continuity provider, please refer to [email or web address]. After hours, contact General Neurology

## 2016-10-31 NOTE — Progress Notes (Signed)
Patient arrived to unit.  Alert, oriented. Denies pain or discomfort.  Telemetry applied and verified.  Resting in bed, eating lunch, call light in reach.

## 2016-10-31 NOTE — Progress Notes (Signed)
ANTICOAGULATION CONSULT NOTE   Pharmacy Consult for heparin Indication: stroke  Patient Measurements: Height: 5\' 10"  (177.8 cm) Weight: 215 lb 6.2 oz (97.7 kg) IBW/kg (Calculated) : 73 Heparin Dosing Weight: 93kg  Vital Signs: Temp: 98.6 F (37 C) (09/02 0800) Temp Source: Oral (09/02 0800) BP: 114/81 (09/02 0200) Pulse Rate: 75 (09/02 0200)  Labs:  Recent Labs  10/29/16 0220 10/29/16 0719  10/30/16 0242 10/30/16 1225 10/30/16 2006 10/31/16 0320  HGB 11.5*  --   --  11.3*  --   --  11.3*  HCT 37.5*  --   --  36.3*  --   --  36.1*  PLT 220  --   --  219  --   --  229  LABPROT  --  15.0  --   --   --   --   --   INR  --  1.19  --   --   --   --   --   HEPARINUNFRC  --   --   < > 0.23* 0.41 0.34 0.38  CREATININE  --  0.70  --  0.69  --   --  0.64  < > = values in this interval not displayed.  Estimated Creatinine Clearance: 128.1 mL/min (by C-G formula based on SCr of 0.64 mg/dL).  Medications:  Infusions:  . sodium chloride 25 mL/hr (10/30/16 2100)    Assessment: 51 [facility]. 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 Started on IV heparin per neuro recommendations.  Heparin level is therapeutic this am on 2100 units/hr. H/H low stable. Plt wnl No s/s of bleeding noted.   Goal of Therapy:  Heparin level 0.3-0.5 units/ml Monitor platelets by anticoagulation protocol: Yes   Plan:  Continue IV heparin at 2100 units/hr  Daily heparin level and CBC  Sheppard CoilFrank Wilson PharmD., BCPS Clinical Pharmacist Pager [phone] 10/31/2016 11:10 AM

## 2016-10-31 NOTE — Progress Notes (Signed)
Transfer to 5C16 via bed with belongings. Wife notifed by pt.

## 2016-11-01 ENCOUNTER — Inpatient Hospital Stay (HOSPITAL_COMMUNITY)
Admission: RE | Admit: 2016-11-01 | Discharge: 2016-11-27 | DRG: 057 | Disposition: A | Discharge status: Home / Self Care | Payer: Medicaid Other | Source: Intra-hospital | Attending: Physical Medicine & Rehabilitation | Admitting: Physical Medicine & Rehabilitation

## 2016-11-01 ENCOUNTER — Encounter (HOSPITAL_COMMUNITY): Payer: Self-pay | Admitting: Physical Medicine and Rehabilitation

## 2016-11-01 DIAGNOSIS — Z8249 Family history of ischemic heart disease and other diseases of the circulatory system: Secondary | ICD-10-CM

## 2016-11-01 DIAGNOSIS — I69322 Dysarthria following cerebral infarction: Secondary | ICD-10-CM | POA: Diagnosis not present

## 2016-11-01 DIAGNOSIS — D72829 Elevated white blood cell count, unspecified: Secondary | ICD-10-CM | POA: Diagnosis present

## 2016-11-01 DIAGNOSIS — E871 Hypo-osmolality and hyponatremia: Secondary | ICD-10-CM | POA: Diagnosis present

## 2016-11-01 DIAGNOSIS — K7030 Alcoholic cirrhosis of liver without ascites: Secondary | ICD-10-CM | POA: Diagnosis present

## 2016-11-01 DIAGNOSIS — I639 Cerebral infarction, unspecified: Secondary | ICD-10-CM

## 2016-11-01 DIAGNOSIS — E7800 Pure hypercholesterolemia, unspecified: Secondary | ICD-10-CM | POA: Diagnosis present

## 2016-11-01 DIAGNOSIS — R319 Hematuria, unspecified: Secondary | ICD-10-CM | POA: Diagnosis not present

## 2016-11-01 DIAGNOSIS — I63012 Cerebral infarction due to thrombosis of left vertebral artery: Secondary | ICD-10-CM | POA: Diagnosis not present

## 2016-11-01 DIAGNOSIS — I69351 Hemiplegia and hemiparesis following cerebral infarction affecting right dominant side: Principal | ICD-10-CM

## 2016-11-01 DIAGNOSIS — G8191 Hemiplegia, unspecified affecting right dominant side: Secondary | ICD-10-CM | POA: Diagnosis not present

## 2016-11-01 DIAGNOSIS — I10 Essential (primary) hypertension: Secondary | ICD-10-CM

## 2016-11-01 DIAGNOSIS — G441 Vascular headache, not elsewhere classified: Secondary | ICD-10-CM | POA: Diagnosis present

## 2016-11-01 DIAGNOSIS — Z820 Family history of epilepsy and other diseases of the nervous system: Secondary | ICD-10-CM | POA: Diagnosis not present

## 2016-11-01 DIAGNOSIS — F4323 Adjustment disorder with mixed anxiety and depressed mood: Secondary | ICD-10-CM

## 2016-11-01 DIAGNOSIS — Z79899 Other long term (current) drug therapy: Secondary | ICD-10-CM

## 2016-11-01 DIAGNOSIS — K219 Gastro-esophageal reflux disease without esophagitis: Secondary | ICD-10-CM | POA: Diagnosis present

## 2016-11-01 DIAGNOSIS — D62 Acute posthemorrhagic anemia: Secondary | ICD-10-CM | POA: Diagnosis present

## 2016-11-01 DIAGNOSIS — I69331 Monoplegia of upper limb following cerebral infarction affecting right dominant side: Secondary | ICD-10-CM | POA: Diagnosis not present

## 2016-11-01 DIAGNOSIS — T45515A Adverse effect of anticoagulants, initial encounter: Secondary | ICD-10-CM | POA: Diagnosis not present

## 2016-11-01 DIAGNOSIS — Z7982 Long term (current) use of aspirin: Secondary | ICD-10-CM | POA: Diagnosis not present

## 2016-11-01 DIAGNOSIS — I63112 Cerebral infarction due to embolism of left vertebral artery: Secondary | ICD-10-CM | POA: Diagnosis not present

## 2016-11-01 DIAGNOSIS — R7303 Prediabetes: Secondary | ICD-10-CM | POA: Diagnosis present

## 2016-11-01 DIAGNOSIS — D6832 Hemorrhagic disorder due to extrinsic circulating anticoagulants: Secondary | ICD-10-CM | POA: Diagnosis not present

## 2016-11-01 DIAGNOSIS — M21371 Foot drop, right foot: Secondary | ICD-10-CM | POA: Diagnosis present

## 2016-11-01 DIAGNOSIS — E876 Hypokalemia: Secondary | ICD-10-CM

## 2016-11-01 DIAGNOSIS — N4889 Other specified disorders of penis: Secondary | ICD-10-CM

## 2016-11-01 LAB — CBC
HEMATOCRIT: 36.5 % — AB (ref 39.0–52.0)
HEMOGLOBIN: 11.5 g/dL — AB (ref 13.0–17.0)
MCH: 26.9 pg (ref 26.0–34.0)
MCHC: 31.5 g/dL (ref 30.0–36.0)
MCV: 85.5 fL (ref 78.0–100.0)
Platelets: 245 10*3/uL (ref 150–400)
RBC: 4.27 MIL/uL (ref 4.22–5.81)
RDW: 19.4 % — AB (ref 11.5–15.5)
WBC: 11.3 10*3/uL — AB (ref 4.0–10.5)

## 2016-11-01 LAB — BASIC METABOLIC PANEL
ANION GAP: 8 (ref 5–15)
BUN: 7 mg/dL (ref 6–20)
CALCIUM: 9.1 mg/dL (ref 8.9–10.3)
CO2: 25 mmol/L (ref 22–32)
Chloride: 104 mmol/L (ref 101–111)
Creatinine, Ser: 0.8 mg/dL (ref 0.61–1.24)
GFR calc non Af Amer: >60 mL/min (ref >60)
Glucose, Bld: 107 mg/dL — ABNORMAL HIGH (ref 65–99)
Potassium: 3.6 mmol/L (ref 3.5–5.1)
SODIUM: 137 mmol/L (ref 135–145)

## 2016-11-01 MED ORDER — PROCHLORPERAZINE 25 MG RE SUPP: 12.5000 mg | Freq: Four times a day (QID) | RECTAL | Status: DC | PRN

## 2016-11-01 MED ORDER — GUAIFENESIN-DM 100-10 MG/5ML PO SYRP: 5.0000 mL | ORAL_SOLUTION | Freq: Four times a day (QID) | ORAL | Status: DC | PRN

## 2016-11-01 MED ORDER — TRAZODONE HCL 50 MG PO TABS
25.0000 mg | ORAL_TABLET | Freq: Every evening | ORAL | Status: DC | PRN
  Filled 2016-11-01: qty 1

## 2016-11-01 MED ORDER — CLOPIDOGREL BISULFATE 75 MG PO TABS
75.0000 mg | ORAL_TABLET | Freq: Every day | ORAL | Status: DC
  Administered 2016-11-02 – 2016-11-27 (×26): 75 mg via ORAL
  Filled 2016-11-01 (×26): qty 1

## 2016-11-01 MED ORDER — FUROSEMIDE 40 MG PO TABS
40.0000 mg | ORAL_TABLET | Freq: Every morning | ORAL | Status: DC
  Administered 2016-11-02 – 2016-11-27 (×28): 40 mg via ORAL
  Filled 2016-11-01 (×28): qty 1

## 2016-11-01 MED ORDER — LACTULOSE 10 GM/15ML PO SOLN
30.0000 g | Freq: Two times a day (BID) | ORAL | Status: DC
  Administered 2016-11-01 – 2016-11-08 (×15): 30 g via ORAL
  Filled 2016-11-01 (×15): qty 45

## 2016-11-01 MED ORDER — SPIRONOLACTONE 25 MG PO TABS
25.0000 mg | ORAL_TABLET | Freq: Every morning | ORAL | Status: DC
  Administered 2016-11-02 – 2016-11-27 (×28): 25 mg via ORAL
  Filled 2016-11-01 (×27): qty 1

## 2016-11-01 MED ORDER — PANTOPRAZOLE SODIUM 40 MG PO TBEC
40.0000 mg | DELAYED_RELEASE_TABLET | Freq: Every morning | ORAL | Status: DC
  Administered 2016-11-02 – 2016-11-27 (×28): 40 mg via ORAL
  Filled 2016-11-01 (×27): qty 1

## 2016-11-01 MED ORDER — PROCHLORPERAZINE MALEATE 5 MG PO TABS: 5.0000 mg | ORAL_TABLET | Freq: Four times a day (QID) | ORAL | Status: DC | PRN

## 2016-11-01 MED ORDER — PROCHLORPERAZINE EDISYLATE 5 MG/ML IJ SOLN: 5.0000 mg | Freq: Four times a day (QID) | INTRAMUSCULAR | Status: DC | PRN

## 2016-11-01 MED ORDER — ACETAMINOPHEN 325 MG PO TABS
325.0000 mg | ORAL_TABLET | ORAL | Status: DC | PRN
  Administered 2016-11-01 – 2016-11-24 (×3): 650 mg via ORAL
  Administered 2016-11-25: 325 mg via ORAL
  Filled 2016-11-01 (×5): qty 2

## 2016-11-01 MED ORDER — ALUM & MAG HYDROXIDE-SIMETH 200-200-20 MG/5ML PO SUSP: 30.0000 mL | ORAL | Status: DC | PRN

## 2016-11-01 MED ORDER — FLEET ENEMA 7-19 GM/118ML RE ENEM: 1.0000 | ENEMA | Freq: Once | RECTAL | Status: DC | PRN

## 2016-11-01 MED ORDER — BISACODYL 10 MG RE SUPP: 10.0000 mg | Freq: Every day | RECTAL | Status: DC | PRN

## 2016-11-01 MED ORDER — ENOXAPARIN SODIUM 40 MG/0.4ML ~~LOC~~ SOLN
40.0000 mg | SUBCUTANEOUS | Status: DC
  Administered 2016-11-01 – 2016-11-07 (×7): 40 mg via SUBCUTANEOUS
  Filled 2016-11-01 (×7): qty 0.4

## 2016-11-01 MED ORDER — LACTULOSE 10 GM/15ML PO SOLN: 30.0000 g | Freq: Every day | ORAL | Status: DC | PRN

## 2016-11-01 MED ORDER — ASPIRIN EC 81 MG PO TBEC
81.0000 mg | DELAYED_RELEASE_TABLET | Freq: Every day | ORAL | Status: DC
  Administered 2016-11-02 – 2016-11-27 (×26): 81 mg via ORAL
  Filled 2016-11-01 (×26): qty 1

## 2016-11-01 MED ORDER — DIPHENHYDRAMINE HCL 12.5 MG/5ML PO ELIX: 12.5000 mg | ORAL_SOLUTION | Freq: Four times a day (QID) | ORAL | Status: DC | PRN

## 2016-11-01 NOTE — Care Management Note (Signed)
Case Management Note  Patient Details  Name: Jordan KnackChristopher Lamke MRN: [id number] Date of Birth: [birth year]  Subjective/Objective:                    Action/Plan: Pt discharging to CIR today. No further needs per CM.   Expected Discharge Date:                  Expected Discharge Plan:  IP Rehab Facility  In-House Referral:     Discharge planning Services  CM Consult  Post Acute Care Choice:    Choice offered to:     DME Arranged:    DME Agency:     HH Arranged:    HH Agency:     Status of Service:  Completed, signed off  If discussed at [employer] Length of Stay Meetings, dates discussed:    Additional Comments:  Kermit BaloKelli F Jilliane Kazanjian, RN 11/01/2016, 11:27 AM

## 2016-11-01 NOTE — Progress Notes (Signed)
Patient ID: Jordan KnackChristopher Thompson, male   DOB: 09-19-1965, 51 y.o.   MRN: [id number] Patient admitted to 4M13 via bed, escorted by nursing staff and family.  Patient and family verbalized understanding of rehab process.  Patient appears eager to get better and work with therapy.  Appears to be in no immediate distress at this time.  Dani Gobbleeardon, Tamee Battin J, RN

## 2016-11-01 NOTE — Progress Notes (Signed)
Inpatient Rehabilitation  Met with patient, spouse, and mother-in-law at bedside to discuss team's recommendation for IP Rehab.  Shared booklets and answered questions.  They are eager for patient to regain his independence and are in favor of CIR.  I have reached out to acute medical team to clarify medical readiness prior to hopeful admission today.  Plan to update team when I know.  Please call with questions.   Carmelia Roller., CCC/SLP Admission Coordinator  [facility]  Cell [phone]

## 2016-11-01 NOTE — H&P (Signed)
Physical Medicine and Rehabilitation Admission H&P    CC:  Right sided weakness with difficulty walking and completing ADL tasks.   HPI:  Jordan Thompson is a 51 y.o. 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 He was admitted via  [facility] on 10/27/16 with RUE/RLE weakness and numbness. History taken from chart review and patient. He was transferred to [facility] further workup. Neurology was consulted and stroke workup initiated. MRI brain performed on 8/30 reviewed, showing left medullary infarct. Per report, acute infarct in left paramedian medullary, acute and subacute bilateral PICA territory, left subacute to chronic left medullary infarct. 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 Neurology recommended transition to DPT for 3 months and Plavix alone after stable on heparin. Patient with resulting right-sided weakness and mild dysarthria.  Cardiac echo with EF 60-65% with no wall abnormality. Hospital course complicated by leukocytosis, acute blood loss anemia. He used a rolling walker PTA.  Lives with his wife who works during the day.    Review of Systems  HENT: Negative for ear pain, hearing loss and tinnitus.   Eyes: Negative for blurred vision and double vision (resolved).  Respiratory: Negative for cough.   Cardiovascular: Negative for chest pain and palpitations.  Gastrointestinal: Positive for diarrhea. Negative for heartburn and nausea.  Genitourinary: Negative for dysuria and urgency.       Gets up 2-3 times a day  Musculoskeletal: Negative for back pain,  myalgias and neck pain.  Skin: Negative for itching and rash.  Neurological: Positive for tremors, sensory change (right sided numbness and left foot), speech change, focal weakness, weakness and headaches. Negative for dizziness and seizures.  Psychiatric/Behavioral: Negative for depression and memory loss. The patient is not nervous/anxious.   All other systems reviewed and are negative.    Past Medical History:  Diagnosis Date  . Cirrhosis of liver ([place])   . CVA (cerebral vascular accident) ([place])   . Hypercholesterolemia   . Hypertension     Past Surgical History:  Procedure Laterality Date  . PARACENTESIS  07/2016   tapped for 12 liters during hospitalization     Family History  Problem Relation Age of Onset  . Hypertension Mother   . Hypertension Father     Social History:  Married. Independent without AD prior to 07/2016. 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 He reports that he has never smoked. He has never used smokeless tobacco. He reports that he quit alcohol-- 77 days today. Was drinking  a fifth of vodka. He reports that he does not use drugs.    Allergies  Allergen Reactions  . Aleve [Naproxen Sodium] Other (See Comments)    Pt will not take  . Penicillins Nausea And Vomiting and Other (See Comments)    Pt states had a rxn as a child Has patient had a PCN reaction causing immediate rash, facial/tongue/throat swelling, SOB or lightheadedness with hypotension: No Has patient had a PCN reaction causing severe rash involving mucus membranes or skin necrosis: No Has patient had a PCN reaction that required hospitalization: No Has patient had a PCN reaction occurring within the last 10 years: No If all of  the above answers are "NO", then may proceed with Cephalosporin use.  . Pepto-Bismol  [Bismuth Subsalicylate] Nausea Only    Medications Prior to Admission  Medication Sig Dispense Refill  . aspirin 81 MG chewable tablet Chew 81 mg by  mouth daily.     Marland Kitchen atorvastatin (LIPITOR) 40 MG tablet Take 40 mg by mouth daily at 6 PM.    . carvedilol (COREG) 3.125 MG tablet Take 3.125 mg by mouth 2 (two) times daily with a meal.    . furosemide (LASIX) 40 MG tablet Take 40 mg by mouth every morning.    . lactulose (CHRONULAC) 10 GM/15ML solution Take 30 g by mouth 3 (three) times daily.    . Multiple Vitamins-Minerals (MULTIVITAMIN PO) Take 1 tablet by mouth daily.    . pantoprazole (PROTONIX) 40 MG tablet Take 40 mg by mouth every morning.    Marland Kitchen spironolactone (ALDACTONE) 25 MG tablet Take 25 mg by mouth every morning.      Home: Home Living Family/patient expects to be discharged to:: Inpatient rehab Living Arrangements: Spouse/significant other Available Help at Discharge: Family Type of Home: House Home Access: Level entry Home Layout: Two level, Bed/bath upstairs Alternate Level Stairs-Number of Steps: 13 Alternate Level Stairs-Rails: Left Bathroom Shower/Tub: Multimedia programmer: Standard Home Equipment: Environmental consultant - 2 wheels, Grab bars - tub/shower  Lives With: Spouse   Functional History: Prior Function Level of Independence: Independent with assistive device(s) Comments: wife assisted with entry and exit from shower; using walker since last stroke and doing "his own PT with weights and exercises given by his neice who is a Marine scientist in PT"  Functional Status:  Mobility: Bed Mobility Overal bed mobility: Needs Assistance Bed Mobility: Rolling, Sidelying to Sit Rolling: Min assist Sidelying to sit: Mod assist, +2 for safety/equipment Sit to supine: Max assist General bed mobility comments: assist and cues for hand placement coming upright, assist for legs and trunk Transfers Overall transfer level: Needs assistance Equipment used: 2 person hand held assist Transfer via Lift Equipment: Stedy Transfers: Sit to/from Stand, [place] to Stand: Max assist, +2 physical assistance Stand pivot  transfers:  (required [place] eo fstedy) Squat pivot transfers: Max assist General transfer comment: assist to block R LE and for standing to facilitate hip extension, stood second attempt with chair back on L side to facilitate L weight shift; pivot to chair with cues for hand placement and L weight shift, lifting help to get to chair Ambulation/Gait General Gait Details: unable    ADL: ADL Overall ADL's : Needs assistance/impaired Eating/Feeding: Set up Grooming: Minimal assistance Upper Body Bathing: Moderate assistance, Sitting Lower Body Bathing: Moderate assistance, Sit to/from stand Upper Body Dressing : Moderate assistance Lower Body Dressing: Maximal assistance, Sit to/from stand Toilet Transfer: +2 for physical assistance, Maximal assistance (required use of Stedy) Toileting- Clothing Manipulation and Hygiene: Maximal assistance Toileting - Clothing Manipulation Details (indicate cue type and reason): once sitting on BSC, pt able to scoot hips back onto toilet; Able to lean R to attempt hygiene after toielting with min A Functional mobility during ADLs: Maximal assistance, +2 for physical assistance General ADL Comments: Pt attempting to trnasfer to toilet. Pt unable to maintain weight through RLE and required use of Stedy to safely transfer to [place]: Cognition Overall Cognitive Status: No family/caregiver present to determine baseline cognitive functioning Arousal/Alertness: Awake/alert Orientation Level: Oriented X4 Attention: Alternating Alternating Attention: Appears intact Memory: Appears intact Awareness: Appears intact Problem Solving: Appears intact  Safety/Judgment: Appears intact Cognition Arousal/Alertness: Awake/alert Behavior During Therapy: WFL for tasks assessed/performed Overall Cognitive Status: No family/caregiver present to determine baseline cognitive functioning General Comments: will further assess   Blood pressure 133/90, pulse 87,  temperature 98.2 F (36.8 C), temperature source Oral, resp. rate 18, height '5\' 10"'  (1.778 m), weight 97.7 kg (215 lb 6.2 oz), SpO2 96 %. Physical Exam  Nursing note and vitals reviewed. Constitutional: He is oriented to person, place, and time. He appears well-developed and well-nourished. No distress.  HENT:  Head: Normocephalic and atraumatic.  Eyes: Pupils are equal, round, and reactive to light. Conjunctivae and EOM are normal.  Neck: Normal range of motion. Neck supple.  Cardiovascular: Normal rate, regular rhythm and intact distal pulses.   Respiratory: Effort normal and breath sounds normal. No stridor. No respiratory distress. He has no wheezes.  GI: Soft. Bowel sounds are normal. He exhibits no distension. There is no tenderness.  Musculoskeletal: He exhibits no edema or tenderness.  Neurological: He is alert and oriented to person, place, and time.  Dysphonia.  Right facial weakness.  Verbose.   Nystagmus left > right.  RUE/RLE: 0/5 proximal to distal LUE/LLE: 5/5 proximal to distal Sensation intact to light touch DTRs 3+ LUE/LLE  Skin: Skin is warm and dry. He is not diaphoretic.  Psychiatric: He has a normal mood and affect. His behavior is normal. Thought content normal.    Results for orders placed or performed during the hospital encounter of 10/28/16 (from the past 48 hour(s))  Heparin level (unfractionated)     Status: None   Collection Time: 10/30/16  8:06 PM  Result Value Ref Range   Heparin Unfractionated 0.34 0.30 - 0.70 IU/mL    Comment:        IF HEPARIN RESULTS ARE BELOW EXPECTED VALUES, AND PATIENT DOSAGE HAS BEEN CONFIRMED, SUGGEST FOLLOW UP TESTING OF ANTITHROMBIN III LEVELS.   CBC     Status: Abnormal   Collection Time: 10/31/16  3:20 AM  Result Value Ref Range   WBC 12.1 (H) 4.0 - 10.5 K/uL   RBC 4.26 4.22 - 5.81 MIL/uL   Hemoglobin 11.3 (L) 13.0 - 17.0 g/dL   HCT 36.1 (L) 39.0 - 52.0 %   MCV 84.7 78.0 - 100.0 fL   MCH 26.5 26.0 - 34.0 pg    MCHC 31.3 30.0 - 36.0 g/dL   RDW 19.2 (H) 11.5 - 15.5 %   Platelets 229 150 - 400 K/uL  Basic metabolic panel     Status: Abnormal   Collection Time: 10/31/16  3:20 AM  Result Value Ref Range   Sodium 139 135 - 145 mmol/L   Potassium 3.7 3.5 - 5.1 mmol/L   Chloride 107 101 - 111 mmol/L   CO2 23 22 - 32 mmol/L   Glucose, Bld 103 (H) 65 - 99 mg/dL   BUN <5 (L) 6 - 20 mg/dL   Creatinine, Ser 0.64 0.61 - 1.24 mg/dL   Calcium 9.0 8.9 - 10.3 mg/dL   GFR calc non Af Amer >60 >60 mL/min   GFR calc Af Amer >60 >60 mL/min    Comment: (NOTE) The eGFR has been calculated using the CKD EPI equation. This calculation has not been validated in all clinical situations. eGFR's persistently <60 mL/min signify possible Chronic Kidney Disease.    Anion gap 9 5 - 15  Heparin level (unfractionated)     Status: None   Collection Time: 10/31/16  3:20 AM  Result Value Ref Range  Heparin Unfractionated 0.38 0.30 - 0.70 IU/mL    Comment:        IF HEPARIN RESULTS ARE BELOW EXPECTED VALUES, AND PATIENT DOSAGE HAS BEEN CONFIRMED, SUGGEST FOLLOW UP TESTING OF ANTITHROMBIN III LEVELS.   CBC     Status: Abnormal   Collection Time: 11/01/16  3:34 AM  Result Value Ref Range   WBC 11.3 (H) 4.0 - 10.5 K/uL   RBC 4.27 4.22 - 5.81 MIL/uL   Hemoglobin 11.5 (L) 13.0 - 17.0 g/dL   HCT 36.5 (L) 39.0 - 52.0 %   MCV 85.5 78.0 - 100.0 fL   MCH 26.9 26.0 - 34.0 pg   MCHC 31.5 30.0 - 36.0 g/dL   RDW 19.4 (H) 11.5 - 15.5 %   Platelets 245 150 - 400 K/uL  Basic metabolic panel     Status: Abnormal   Collection Time: 11/01/16  3:34 AM  Result Value Ref Range   Sodium 137 135 - 145 mmol/L   Potassium 3.6 3.5 - 5.1 mmol/L   Chloride 104 101 - 111 mmol/L   CO2 25 22 - 32 mmol/L   Glucose, Bld 107 (H) 65 - 99 mg/dL   BUN 7 6 - 20 mg/dL   Creatinine, Ser 0.80 0.61 - 1.24 mg/dL   Calcium 9.1 8.9 - 10.3 mg/dL   GFR calc non Af Amer >60 >60 mL/min   GFR calc Af Amer >60 >60 mL/min    Comment: (NOTE) The eGFR has  been calculated using the CKD EPI equation. This calculation has not been validated in all clinical situations. eGFR's persistently <60 mL/min signify possible Chronic Kidney Disease.    Anion gap 8 5 - 15   No results found.     Medical Problem List and Plan: 1.  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  2.  DVT Prophylaxis/Anticoagulation: Pharmaceutical: Lovenox--monitor platelets for stability.  3. HA/Pain Management: tylenol prn for HA.  4. Mood: LCSW to follow for evaluation and support. Team to provide ego support.  5. Neuropsych: This patient is capable of making decisions on his own behalf. 6. Skin/Wound Care: routine pressure relief measures.  7. Fluids/Electrolytes/Nutrition: Monitor I/O. Check lytes in am--may need K+ supplement.  8. HTN: Monitor BP bid--continue lasix and aldactone.  9. Cirrhosis of liver: has been set up with GI at [facility]. New diagnosis post decompensation. Resume low salt restrictions with lasix and aldactone. Will decrease lactulose to bid and check ammonia levels next week (recent level 28) and frequent loose stools reported. Will continue to monitor for encephalopathy.  Monitor weights daily. 2 gram tylenol max/day 9. Leucocytosis: Monitor for signs of infection--likely reactive.  10.  Prediabetes: Hgb A1c- 6.1.  Will add CM restrictions. Will consult dietitian to reinforce education.    Post Admission Physician Evaluation: 1. Preadmission assessment reviewed and changes made below. 2. Functional deficits secondary  to left medullary infarct, bilateral cerebellar infarcts. 3. Patient is admitted to receive collaborative, interdisciplinary care between the physiatrist, rehab nursing staff, and therapy team. 4. Patient's level of medical complexity and substantial therapy needs in context of that medical necessity cannot be provided at a lesser intensity of care such as a SNF. 5. Patient has  experienced substantial functional loss from his/her baseline which was documented above under the "Functional History" and "Functional Status" headings.  Judging by the patient's diagnosis, physical exam, and functional history, the patient has potential for functional progress which will result in measurable gains while on  inpatient rehab.  These gains will be of substantial and practical use upon discharge  in facilitating mobility and self-care at the household level. 25. Physiatrist will provide 24 hour management of medical needs as well as oversight of the therapy plan/treatment and provide guidance as appropriate regarding the interaction of the two. 7. 24 hour rehab nursing will assist with safety, disease management and patient education  and help integrate therapy concepts, techniques,education, etc. 8. PT will assess and treat for/with: Lower extremity strength, range of motion, stamina, balance, functional mobility, safety, adaptive techniques and equipment, coping skills, pain control, stroke education.   Goals are: Min A. 9. OT will assess and treat for/with: ADL's, functional mobility, safety, upper extremity strength, adaptive techniques and equipment, ego support, and community reintegration.   Goals are: Min A. Therapy may proceed with showering this patient. 10. SLP will assess and treat for/with: higher cognitive functioning.  Goals are: Mod I. 11. Case Management and Social Worker will assess and treat for psychological issues and discharge planning. 12. Team conference will be held weekly to assess progress toward goals and to determine barriers to discharge. 13. Patient will receive at least 3 hours of therapy per day at least 5 days per week. 14. ELOS: 16-19 days.       15. Prognosis:  good  Delice Lesch, MD, [address], [place] 11/01/2016

## 2016-11-01 NOTE — Progress Notes (Signed)
Inpatient Rehabilitation  I have received acute medical clearance and will proceed with admitting patient to IP Rehab today.  Please call with questions.   Charlane FerrettiMelissa Lional Icenogle, M.A., CCC/SLP Admission Coordinator  [facility]  Cell [phone]

## 2016-11-01 NOTE — Discharge Summary (Signed)
Stroke Discharge Summary  Patient ID: Jordan Thompson    l   MRN: [id number]      DOB: March 23, 1965  Date of Admission: 10/28/2016 Date of Discharge: 11/01/2016  Attending Physician:  Marvel PlanXu, Jindong, MD, Stroke MD Consultant(s):   rehabilitation medicine Patient's PCP:  No primary care provider on file.  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 Cirrhosis of liver (HCC)   . CVA (cerebral vascular accident) (HCC)   . Hypercholesterolemia   . Hypertension    Past Surgical History:  Procedure Laterality Date  . PARACENTESIS  07/2016   tapped for 12 liters during hospitalization    Medications to be continued on Rehab . aspirin EC  81 mg Oral Daily  . clopidogrel  75 mg Oral Daily  . furosemide  40 mg Oral q morning - 10a  . lactulose  30 g Oral TID  . pantoprazole  40 mg Oral q morning - 10a  . spironolactone  25 mg Oral q morning - 10a    LABORATORY STUDIES CBC    Component Value Date/Time   WBC 11.3 (H) 11/01/2016 0334   RBC 4.27 11/01/2016 0334   HGB 11.5 (L) 11/01/2016 0334   HCT 36.5 (L) 11/01/2016 0334   PLT 245 11/01/2016 0334   MCV 85.5 11/01/2016 0334   MCH 26.9 11/01/2016 0334   MCHC 31.5 11/01/2016 0334   RDW 19.4 (H) 11/01/2016 0334   CMP    Component Value Date/Time   NA 137 11/01/2016 0334   K 3.6 11/01/2016 0334   CL 104 11/01/2016 0334   CO2 25 11/01/2016 0334   GLUCOSE 107 (H) 11/01/2016 0334   BUN 7 11/01/2016 0334   CREATININE 0.80 11/01/2016 0334   CALCIUM 9.1 11/01/2016 0334   GFRNONAA >60 11/01/2016 0334   GFRAA >60 11/01/2016 0334   COAGS Lab Results  Component  Value Date   INR 1.19 10/29/2016   Lipid Panel    Component Value Date/Time   CHOL 88 10/29/2016 0220   TRIG 104 10/29/2016 0220   HDL 17 (L) 10/29/2016 0220   CHOLHDL 5.2 10/29/2016 0220   VLDL 21 10/29/2016 0220   LDLCALC 50 10/29/2016 0220   HgbA1C  Lab Results  Component Value Date   HGBA1C 6.1 (H) 10/29/2016   Urinalysis No results found for: COLORURINE, APPEARANCEUR, LABSPEC, PHURINE, GLUCOSEU, HGBUR, BILIRUBINUR, KETONESUR, PROTEINUR, UROBILINOGEN, NITRITE, LEUKOCYTESUR Urine Drug Screen     Component Value Date/Time   LABOPIA NONE DETECTED 10/29/2016 0853   COCAINSCRNUR NONE DETECTED 10/29/2016 0853   LABBENZ POSITIVE (A) 10/29/2016 0853   AMPHETMU NONE DETECTED 10/29/2016 0853   THCU NONE DETECTED 10/29/2016 0853   LABBARB NONE DETECTED 10/29/2016 0853    Alcohol Level No results found for: [facility]   SIGNIFICANT DIAGNOSTIC STUDIES [name] Contrast 10/28/2016 IMPRESSION: 1. Acute infarct in the left paramedian medulla. 2. Acute and subacute bilateral PICA territory infarcts as above. 3. Late subacute to chronic left medullary infarct.   CTA neck  - left VA occlusion with thready reconstitution in the neck - atherosclerosis without hemodynamically significant stenosis of the carotid arteries.  -  severe right C6-7 neural foraminal narrowing  CTA head - occluded left VA and thrombus extending into central VBJ, at risk of embolization - no anterior cirdulation LVO or significant stenosis  TTE - Left ventricle: The cavity size was normal. There was mild concentric hypertrophy. Systolic function was normal. The estimated ejection fraction was in the range of 60% to 65%. Wall motion was normal; there were no regional wall motion abnormalities. Doppler parameters are consistent with abnormal left ventricular relaxation (grade 1 diastolic dysfunction). - Aortic valve: Transvalvular velocity was within the normal range. There was no stenosis. There  was no regurgitation. - Aorta: Ascending aortic diameter: 41 mm (S). - Ascending aorta: The ascending aorta was mildly dilated. - Mitral valve: Transvalvular velocity was within the normal range. There was no evidence for stenosis. There was no regurgitation. - Right ventricle: The cavity size was normal. Wall thickness was normal. Systolic function was normal. - Tricuspid valve: There was no regurgitation.     HISTORY OF PRESENT ILLNESS Jordan Thompson an 51 y.o.malewho was transferred to [facility] from [facility] for further stroke workup. Patient recently was diagnosed with cirrhosis due to alcoholism to achieve recently quit 74 days ago. He also recently had a CVA on August 8 of 18. At that time his symptoms were left-sided weakness and dysphagia which had fully resolved. Patient presented to [facility] secondary to on onset of right arm weakness and right leg weakness along with numbness. Patient also endorsed some difficulty with walking but no difficulty with swallowing. On exam and was noted that he also had dysarthria. 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 he was transferred to [facility] for further evaluation. Just prior to being transfer patient was given a load of 300 mg of Plavix.  In early august, had LeftPICA Ischemic Stroke  Date last known well: Date: 10/27/2016 Time last known well: Time: 06:30 tPA Given:No: Recent CVA Modified Rankin: Rankin Score=0 NIH stroke scale 11  Patient was not administered IV t-PA secondary to recent cva. He was admitted to the neuro ICU for further evaluation and treatment.  HOSPITAL COURSE Mr. Jordan Thompson is a 51 y.o. male with history of recent left PICA stroke, long term alcohol use disorder leading to cirrhosis and hepatitis  presenting with . He did not receive IV t-PA due recent stroke .   Stroke - acute right cerebellar PICA territory and left paramedian medulla infarcts, likely due to left VA dissection/occlusion with thrombus extension to VBJ. presumebaly left VA also supplies part of the right PICA territory.   Resultant: right sided weakness  MRI head: Acute infarct in the left paramedian medulla. Acute and subacute bilateral PICA territory infarcts as above. Late subacute to chronic left medullary infarct.  CTA head and neck - left VA occlusion with thrombus extension to VBJ  2D Echo  EF 60-65% no valvular problems   LDL 50  HgbA1c 6.1  Heparin gtt for VTE prophylaxis  Diet Heart Room service appropriate? Yes; Fluid consistency: Thin  aspirin 81 mg daily prior to admission, now on heparin IV. Heparin gtt was stopped this AM, ASA/Plavix was started.  Patient counseled to be compliant with his antithrombotic medications  Ongoing aggressive stroke risk factor management  Therapy recommendations:  CIR  Disposition:  Pending  Recent stroke 10/06/16  Left PICA and left medullary infarcts -  due to left VA occlusion  Dysphagia resolved over time  On ASA and lipitor PTA  Liver cirrhosis   Alcohol related  Normal INR and ammonia level  On B1/FA/MVI  Hypertension  Stable  Permissive hypertension (OK if < 180/105) but gradually normalize in 5-7 days  Long-term BP goal normotensive  Hyperlipidemia  Home meds:  Atorvastatin 40  LDL 50, goal < 70  Continue statin at discharge  Diabetes  HgbA1c 6.1, goal < 7.0  Controlled  Other Stroke Risk Factors  Advanced age  ETOH use, advised to stop drinking due to cirrhosis  Obesity, Body mass index is 30.91 kg/m., recommend weight loss, diet and exercise as appropriate   Other Active Problems  DISCHARGE EXAM Blood pressure 118/71, pulse 91, temperature 99 F (37.2 C), temperature source Oral, resp. rate 18, height 5'  10" (1.778 m), weight 97.7 kg (215 lb 6.2 oz), SpO2 97 %. General- Well nourished, well developed, in no apparent distress.  Cardiovascular - Regular rate and rhythm. Lungs: CTA Abdomen soft nt Mental Status-  Level of arousal and orientation to time, place, and person were intact. Language including expression, naming, repetition, comprehension was assessed and found intact. Fund of Knowledge was assessed and was intact.  Cranial Nerves II - XII- II - Visual fieldintact OU. III, IV, VI - Extraocular movements intact, PERRL, left mild ptosis. V - Facial sensation intact bilaterally. VII - right nasolabial fold flattening VIII - Hearing & vestibular intact bilaterally, left end-gaze nystagmus. X - Palate elevates symmetrically, mild dysarthria and hoarseness. XI - Chin turning & shoulder shrug intact bilaterally. XII - Tongue protrusion intact.  Motor Strength - The patient's strength was normal in LUE and LLE, but RUE 0/5 and RLE 1/5.Bulk was normal and fasciculations were absent.  Motor Tone- Muscle tone was assessed at the neck and appendages and was normal.  Reflexes- The patient's reflexes were 1+in all extremities andright babinski and triple reflex positive   Discharge Diet  Diet Heart Room service appropriate? Yes; Fluid consistency: Thin liquids  DISCHARGE PLAN  Disposition:  Transfer to [facility] for ongoing PT, OT and ST  aspirin 81 mg daily and clopidogrel 75 mg daily for secondary stroke prevention.  Recommend ongoing risk factor control by Primary Care Physician at time of discharge from inpatient rehabilitation.  Follow-up No primary care provider on file. in 2 weeks following discharge from rehab.  Follow-up with Dr. Marvel Plan, Stroke Clinic in 6 weeks, office to schedule an appointment.   Patient interested in particpating in Stroke Afib trial and will have research coordinator call him to discuss interest and  eligibility  40 minutes were spent preparing discharge.  I have personally examined this patient, reviewed notes, independently viewed imaging studies, participated in medical decision making and plan of care.ROS completed by me personally and pertinent positives fully documented  I have made any additions or clarifications directly to the above note. Agree with note above.   Delia Heady, MD Medical Director [facility] Pager: [phone] 11/01/2016 4:14 PM

## 2016-11-01 NOTE — Progress Notes (Signed)
Physical Therapy Treatment Patient Details Name: Jordan Thompson MRN: [id number] DOB: 30-Oct-1965 Today's Date: 11/01/2016    History of Present Illness Pt is a 51 y.o. maletransferred to [facility] for stroke work up. 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 Pt had recent admission for left PICA stroke on 10/06/16. PMH also includes cirrhosis of liver, HTN    PT Comments    Pt encouraged by today's session, very participative and excited about going to CIR today.  Emphasis on transitions, sitting balance, standing tolerance/balance and squat-pivot transfer.   Follow Up Recommendations  CIR     Equipment Recommendations  Other (comment) (TBA next venue)    Recommendations for Other Services       Precautions / Restrictions Precautions Precautions: Fall Restrictions Weight Bearing Restrictions: No    Mobility  Bed Mobility Overal bed mobility: Needs Assistance Bed Mobility: Rolling;Sidelying to Sit Rolling: Min assist Sidelying to sit: Mod assist;+2 for safety/equipment       General bed mobility comments: cues for sequencing, facilitating normal roll, truncal assist to come up to full sitting  Transfers Overall transfer level: Needs assistance Equipment used: Ambulation equipment used;2 person hand held assist Transfers: Sit to/from [employer] Sit to Stand: Max assist;+2 physical assistance (x2)   Squat pivot transfers: Max assist;+2 safety/equipment     General transfer comment: used unilater assist device for patient to stabilize on.  pt was significantly supported on R trunk and R Knee;  Ambulation/Gait             General Gait Details: unable   Stairs            Wheelchair Mobility    Modified Rankin (Stroke Patients Only) Modified Rankin (Stroke Patients Only) Pre-Morbid Rankin Score: Moderate disability Modified Rankin: Severe  disability     Balance Overall balance assessment: Needs assistance Sitting-balance support: No upper extremity supported;Single extremity supported Sitting balance-Leahy Scale:  (approaching fair) Sitting balance - Comments: pt tended to list R unless assisted or cued to w/shift Left.  Pt begining to be able to find and hold midline, but unable to hold for significant time.  Worked on leaning forward and to the sides and attempting to retrun to midline.  Worked on symmetrical midline.   Standing balance support: Single extremity supported Standing balance-Leahy Scale: Zero Standing balance comment: stood at EOB with R knee supported, L UE on chairback .  2 person support.  Worked on w/shift, upright stance and balance, attempts to control R knee.                            Cognition Arousal/Alertness: Awake/alert Behavior During Therapy: WFL for tasks assessed/performed Overall Cognitive Status: Within Functional Limits for tasks assessed                                        Exercises      General Comments        Pertinent Vitals/Pain Pain Assessment: Faces Faces Pain Scale: No hurt    Home Living                      Prior Function Level of Independence: Independent with assistive device(s)      Comments: wife assisted with entry and exit from shower; using  walker since last stroke and doing "his own PT with weights and exercises given by his neice who is a nurse in PT"   PT Goals (current goals can now be found in the care plan section) Acute Rehab PT Goals Patient Stated Goal: to go to rehab PT Goal Formulation: With patient Time For Goal Achievement: 11/12/16 Potential to Achieve Goals: Good Progress towards PT goals: Progressing toward goals    Frequency    Min 4X/week      PT Plan Current plan remains appropriate    Co-evaluation              AM-PAC PT "6 Clicks" Daily Activity  Outcome Measure  Difficulty  turning over in bed (including adjusting bedclothes, sheets and blankets)?: A Lot Difficulty moving from lying on back to sitting on the side of the bed? : Unable Difficulty sitting down on and standing up from a chair with arms (e.g., wheelchair, bedside commode, etc,.)?: Unable Help needed moving to and from a bed to chair (including a wheelchair)?: Total Help needed walking in hospital room?: Total Help needed climbing 3-5 steps with a railing? : Total 6 Click Score: 7    End of Session Equipment Utilized During Treatment: Gait belt Activity Tolerance: Patient tolerated treatment well Patient left: in chair;with call bell/phone within reach Nurse Communication: Mobility status PT Visit Diagnosis: Hemiplegia and hemiparesis;Muscle weakness (generalized) (M62.81);Other symptoms and signs involving the nervous system (R29.898) Hemiplegia - Right/Left: Right Hemiplegia - dominant/non-dominant: Dominant Hemiplegia - caused by: Cerebral infarction     Time: 1339-1405 PT Time Calculation (min) (ACUTE ONLY): 26 min  Charges:  $Therapeutic Activity: 23-37 mins                    G Codes:       11/01/2016  [place] BingKen Kento Thompson, PT [phone]  (pager)   Eliseo GumKenneth V Eldred Thompson 11/01/2016, 2:57 PM

## 2016-11-01 NOTE — PMR Pre-admission (Signed)
PMR Admission Coordinator Pre-Admission Assessment  Patient: Jordan Thompson is an 51 y.o., male MRN: [id number] DOB: Dec 10, 1965 Height: 5\' 10"  (177.8 cm) Weight: 97.7 kg (215 lb 6.2 oz)              Insurance Information HMO:     PPO:      PCP:      IPA:      80/20:      OTHER:  PRIMARY: uninsured       Policy#:       Subscriber:  CM Name:       Phone#:      Fax#:  Pre-Cert#:       Employer:  Benefits:  Phone #:      Name:  Eff. Date:      Deduct:       Out of Pocket Max:       Life Max:  CIR:       SNF:  Outpatient:      Co-Pay:  Home Health:       Co-Pay:  DME:      Co-Pay:  Providers:  Artist Following: Christia Reading  Medicaid Application Date:       Case Manager:  Disability Application Date:       Case Worker:   Emergency Contact Information Contact Information    Name Relation Home Work Mobile   Hanauer,Wendy Spouse [phone]  [phone]     Current Medical History  Patient Admitting Diagnosis: Left medullary infarct, bilateral cerebellar infarcts  History of Present Illness: Kimber Tamplinis a 51 y.o.malewith pmh of hypertension, hypercholesterolemia, alcoholic cirrhosis, 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 He was admitted via  [facility] on 10/27/16 with RUE/RLE weakness and numbness. History taken from chart review and patient. He was transferred to [facility] further workup. Neurology was consulted and stroke workup initiated. MRI brain performed on 10/28/16 reviewed, showing left medullary infarct. Per report, acute infarct in left paramedian medullary, acute and subacute bilateral PICA territory, left subacute to chronic left medullary infarct. 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 CTA head/neck showed occluded L-VA with thrombus extending into central VBJ- at risk for embolization and therady reconstituing  in neck as well as incident severe right C6-7 foraminal narrowing. Neurology recommended transition to DPT for 3 months and Plavix alone after stable on heparin. Patient with resulting right-sided weakness and mild dysarthria. Cardiac echo with EF 60-65% with no wall abnormality. Hospital course complicated by leukocytosis, acute blood loss anemia. He used a rolling walker PTA. Lives with his wife who works during the day and has a supportive family.   NIH Total: 7    Past Medical History  Past Medical History:  Diagnosis Date  . Cirrhosis of liver (HCC)   . CVA (cerebral vascular accident) (HCC)   . Hypercholesterolemia   . Hypertension     Family History  family history includes Hypertension in his father and mother.  Prior Rehab/Hospitalizations:  Has the patient had major surgery during 100 days prior to admission? No  Current Medications   Current Facility-Administered Medications:  .  acetaminophen (TYLENOL) tablet 650 mg, 650 mg, Oral, Q4H PRN, 650 mg at 10/31/16 2013 **OR** acetaminophen (TYLENOL) solution 650 mg, 650 mg, Per Tube, Q4H PRN **OR** acetaminophen (TYLENOL) suppository 650 mg, 650 mg, Rectal, Q4H PRN, [name], PA-C .  aspirin EC tablet 81 mg, 81 mg, Oral, Daily, Rinehuls,  David L, PA-C, 81 mg at 11/01/16 1002 .  clopidogrel (PLAVIX) tablet 75 mg, 75 mg, Oral, Daily, Rinehuls, David L, PA-C, 75 mg at 11/01/16 1002 .  furosemide (LASIX) tablet 40 mg, 40 mg, Oral, q morning - 10a, Rinehuls, David L, PA-C, 40 mg at 11/01/16 1002 .  lactulose (CHRONULAC) 10 GM/15ML solution 30 g, 30 g, Oral, TID, Rinehuls, David L, PA-C, 30 g at 10/31/16 0935 .  pantoprazole (PROTONIX) EC tablet 40 mg, 40 mg, Oral, q morning - 10a, Rinehuls, David L, PA-C, 40 mg at 11/01/16 1002 .  senna-docusate (Senokot-S) tablet 1 tablet, 1 tablet, Oral, QHS PRN, Ulice DashSmith, David R, PA-C .  spironolactone (ALDACTONE) tablet 25 mg, 25 mg, Oral, q morning - 10a, Rinehuls, David L, PA-C, 25 mg at  11/01/16 1002  Patients Current Diet: Diet Heart Room service appropriate? Yes; Fluid consistency: Thin  Precautions / Restrictions Precautions Precautions: Fall Precaution Comments: pusher Restrictions Weight Bearing Restrictions: No   Has the patient had 2 or more falls or a fall with injury in the past year?Yes, 4 falls no major injuries that required hospitalizations   Prior Activity Level Limited Community (1-2x/wk): Prior to admission patient was Mod I with walker in the community and mostly furniture walkedin the house.  He was able to complete self-care tasks and did meal prep for he and his wife.    Home Assistive Devices / Equipment Home Assistive Devices/Equipment: Dan HumphreysWalker (specify type) Home Equipment: Walker - 2 wheels, Grab bars - tub/shower  Prior Device Use: Indicate devices/aids used by the patient prior to current illness, exacerbation or injury? Walker  Prior Functional Level Prior Function Level of Independence: Independent with assistive device(s) Comments: wife assisted with entry and exit from shower; using walker since last stroke and doing "his own PT with weights and exercises given by his neice who is a nurse in PT"  Self Care: Did the patient need help bathing, dressing, using the toilet or eating? Independent  Indoor Mobility: Did the patient need assistance with walking from room to room (with or without device)? Independent  Stairs: Did the patient need assistance with internal or external stairs (with or without device)? Independent  Functional Cognition: Did the patient need help planning regular tasks such as shopping or remembering to take medications? Needed some help, spouse loaded medication box.  Current Functional Level Cognition  Arousal/Alertness: Awake/alert Overall Cognitive Status: No family/caregiver present to determine baseline cognitive functioning Orientation Level: Oriented X4 General Comments: will further assess Attention:  Alternating Alternating Attention: Appears intact Memory: Appears intact Awareness: Appears intact Problem Solving: Appears intact Safety/Judgment: Appears intact    Extremity Assessment (includes Sensation/Coordination)  Upper Extremity Assessment: Defer to OT evaluation RUE Deficits / Details: Pt with minimal active shoulder elevation and trace elbow flexion with facilitation. Brunstrom I (presynergy)- hand and arm. non-functional RUE at this time RUE Sensation: decreased light touch RUE Coordination: decreased fine motor, decreased gross motor  Lower Extremity Assessment: RLE deficits/detail RLE Deficits / Details: no active movement, but toes move involunarily with stimulation    ADLs  Overall ADL's : Needs assistance/impaired Eating/Feeding: Set up Grooming: Minimal assistance Upper Body Bathing: Moderate assistance, Sitting Lower Body Bathing: Moderate assistance, Sit to/from stand Upper Body Dressing : Moderate assistance Lower Body Dressing: Maximal assistance, Sit to/from stand Toilet Transfer: +2 for physical assistance, Maximal assistance (required use of Stedy) Toileting- Clothing Manipulation and Hygiene: Maximal assistance Toileting - Clothing Manipulation Details (indicate cue type and reason): once sitting on BSC,  pt able to scoot hips back onto toilet; Able to lean R to attempt hygiene after toielting with min A Functional mobility during ADLs: Maximal assistance, +2 for physical assistance General ADL Comments: Pt attempting to trnasfer to toilet. Pt unable to maintain weight through RLE and required use of Stedy to safely transfer to [facility]    Mobility  Overal bed mobility: Needs Assistance Bed Mobility: Rolling, Sidelying to Sit Rolling: Min assist Sidelying to sit: Mod assist, +2 for safety/equipment Sit to supine: Max assist General bed mobility comments: assist and cues for hand placement coming upright, assist for legs and trunk    Transfers  Overall  transfer level: Needs assistance Equipment used: 2 person hand held assist Transfer via Lift Equipment: Stedy Transfers: Sit to/from Stand, Systems analyst Sit to Stand: Max assist, +2 physical assistance Stand pivot transfers:  (required [place] eo fstedy) Squat pivot transfers: Max assist General transfer comment: assist to block R LE and for standing to facilitate hip extension, stood second attempt with chair back on L side to facilitate L weight shift; pivot to chair with cues for hand placement and L weight shift, lifting help to get to chair    Ambulation / Gait / Stairs / Wheelchair Mobility  Ambulation/Gait General Gait Details: unable    Posture / Balance Dynamic Sitting Balance Sitting balance - Comments: R bias; at least minguard to close S for safety Balance Overall balance assessment: Needs assistance Sitting balance-Leahy Scale: Poor Sitting balance - Comments: R bias; at least minguard to close S for safety Standing balance support: Single extremity supported Standing balance-Leahy Scale: Zero Standing balance comment: + 2 for standing with R knee and hip blocked and cues/A for L weight shift x about 1-2 minutes x 2 reps    Special needs/care consideration BiPAP/CPAP: No CPM: No Continuous Drip IV: No Dialysis: No         Life Vest: No Oxygen: No Special Bed: No Trach Size: No Wound Vac (area): No       Skin: WDL                                Bowel mgmt: Continent, last BM 10/31/16 Bladder mgmt: Continent, one instance of ? Retention with need for in a out cath on acute  Diabetic mgmt: HgbA1c 6.1, goal < 7.0     Previous Home Environment Living Arrangements: Spouse/significant other  Lives With: Spouse Available Help at Discharge: Family Type of Home: House Home Layout: Two level, Bed/bath upstairs Alternate Level Stairs-Rails: Left Alternate Level Stairs-Number of Steps: 13 Home Access: Level entry Bathroom Shower/Tub: Health visitor:  Standard Home Care Services: No  Discharge Living Setting Plans for Discharge Living Setting: Patient's home, Lives with (comment) (Spouse ) Type of Home at Discharge: House Discharge Home Layout: Two level, 1/2 bath on main level (Den also on mainlevel that can be converted into a bedroom ) Alternate Level Stairs-Rails: Left Alternate Level Stairs-Number of Steps: 13 Discharge Home Access: Other (comment) (3 steps or ramp ) Discharge Bathroom Shower/Tub: Tub/shower unit Discharge Bathroom Toilet: Standard Discharge Bathroom Accessibility:  (1/2 bath on main-no, full bath upstairs-yes with walker ) Does the patient have any problems obtaining your medications?: Yes (Describe) (uninsured at this time)  Social/Family/Support Systems Patient Roles: Spouse Contact Information: Spouse: Ranen Doolin [phone] Anticipated Caregiver: Spouse and mother-in-law Anticipated Caregiver's Contact Information: see above Ability/Limitations of Caregiver: Spouse works  Engineer, structural  Availability: 24/7 Discharge Plan Discussed with Primary Caregiver: Yes Is Caregiver In Agreement with Plan?: Yes Does Caregiver/Family have Issues with Lodging/Transportation while Pt is in Rehab?: No  Goals/Additional Needs Patient/Family Goal for Rehab: PT/OT Min-Mod A Expected length of stay: 19-23 days  Cultural Considerations: Presbyterian Dietary Needs: Heart Healthy; Low Salt Equipment Needs: TBD Additional Information: Working with financial counseling  Pt/Family Agrees to Admission and willing to participate: Yes Program Orientation Provided & Reviewed with Pt/Caregiver Including Roles  & Responsibilities: Yes Additional Information Needs: None Information Needs to be Provided By: N/A  Barriers to Discharge: Home environment access/layout, Other (comments) (Medicaid Potential working with financial counseling )  Decrease burden of Care through IP rehab admission: No  Possible need for SNF placement upon  discharge: Potentially  Patient Condition: This patient's medical and functional status has changed since the consult dated: 10/30/16 at 1312 in which the Rehabilitation Physician determined and documented that the patient's condition is appropriate for intensive rehabilitative care in an inpatient rehabilitation facility. See "History of Present Illness" (above) for medical update. Functional changes are: Max +2 transfer assist. Patient's medical and functional status update has been discussed with the Rehabilitation physician and patient remains appropriate for inpatient rehabilitation. Will admit to inpatient rehab today.  Preadmission Screen Completed By:  Fae Pippin, 11/01/2016 11:38 AM ______________________________________________________________________   Discussed status with Dr. Allena Katz on 11/01/16 at 1155 and received telephone approval for admission today.  Admission Coordinator:  Fae Pippin, time 1155/Date 11/01/16

## 2016-11-01 NOTE — IPOC Note (Signed)
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)   Right hemiplegia (HCC)   Vascular headache   Hypokalemia     Functional Problem List: Nursing Bladder, Sensory, Medication Management, Motor, Endurance  PT Balance, Endurance, Motor  OT Balance, Endurance, Safety, Perception, Sensory  SLP    TR         Basic ADL's: OT Grooming, Bathing, Eating, Dressing, Toileting     Advanced  ADL's: OT       Transfers: PT Bed Mobility, Bed to Chair, Car, Furniture, Floor  OT Toilet, Tub/Shower     Locomotion: PT Stairs, Psychologist, prison and probation services, Ambulation     Additional Impairments: OT Fuctional Use of Upper Extremity  SLP Communication expression    TR      Anticipated Outcomes Item Anticipated Outcome  Self Feeding set up/s  Swallowing      Basic self-care  Supervision - min A  Toileting  min A   Bathroom Transfers min A  Bowel/Bladder  Min assist  Transfers  Supervision  Locomotion  Mod A   Communication  mod I   Cognition     Pain  < 3  Safety/Judgment  Mod I   Therapy Plan: PT Intensity: Minimum of 1-2 x/day ,45 to 90 minutes PT Frequency: 5 out of 7 days PT Duration Estimated Length of Stay: 20-24 days OT Intensity: Minimum of 1-2 x/day, 45 to 90 minutes OT Frequency: 5 out of 7 days OT Duration/Estimated Length of Stay: 21-24 days SLP Intensity: Minumum of 1-2 x/day, 30 to 90 minutes SLP Frequency: 1 to 3 out of 7 days SLP Duration/Estimated Length of Stay: 7-10 days     Team Interventions: Nursing Interventions Patient/Family Education, Medication Management, Disease Management/Prevention, Discharge Planning, Dysphagia/Aspiration Precaution Training  PT interventions Ambulation/gait training, Disease management/prevention, Pain management, Visual/perceptual  remediation/compensation, Stair training, Wheelchair propulsion/positioning, Therapeutic Activities, Patient/family education, Fish farm manager, Warden/ranger, Cognitive remediation/compensation, Functional electrical stimulation, Psychosocial support, Therapeutic Exercise, UE/LE Strength taining/ROM, Skin care/wound management, Functional mobility training, Community reintegration, Discharge planning, Neuromuscular re-education, Splinting/orthotics, UE/LE Coordination activities  OT Interventions Warden/ranger, Cognitive remediation/compensation, Community reintegration, Discharge planning, Functional mobility training, Psychosocial support, Therapeutic Activities, UE/LE Coordination activities, Patient/family education, DME/adaptive equipment instruction, Pain management, UE/LE Strength taining/ROM, Wheelchair propulsion/positioning, Therapeutic Exercise, Self Care/advanced ADL retraining, Neuromuscular re-education  SLP Interventions Cueing hierarchy, Functional tasks, Internal/external aids, Patient/family education, Multimodal communication approach, Speech/Language facilitation  TR Interventions    SW/CM Interventions Discharge Planning, Psychosocial Support, Patient/Family Education   Barriers to Discharge MD  Medical stability, Lack of/limited family support and Weight  Nursing      PT Inaccessible home environment, Decreased caregiver support, Home environment access/layout, Lack of/limited family support Unsure if wife or caregiver can physically assist if necessary, but plans to have 24/7 supervision at least, R hemiplegia   OT Decreased caregiver support    SLP      SW       Team Discharge Planning: Destination: PT-Home ,OT- Home , SLP-Home Projected Follow-up: PT-Home health PT, OT-  Home health OT, 24 hour supervision/assistance, SLP-Outpatient SLP Projected Equipment Needs: PT-To be determined, OT- To be determined, SLP-None  recommended by SLP Equipment Details: PT- , OT-  Patient/family involved in discharge planning: PT- Patient,  OT-Patient, SLP-Patient  MD ELOS: 18-21 days. Medical Rehab Prognosis:  Good Assessment: 51 y.o.malewith pmh of hypertension, hypercholesterolemia,  recent diagnosis of alcoholic cirrhosis ( admitted for decompensation 07/2016), L-PCA CVA 10/06/16--no residual deficits but requiring walker for ambulation. He was [facility] on 10/27/16 with RUE/RLE weakness and numbness. History taken from chart review and patient. [facility] was [facility] to [facility] workup. Neurology was consulted and stroke workup initiated. MRI brain performed on 8/30 reviewed, showing left medullary infarct. Per report, acute infarct in left paramedian medullary, acute and subacute bilateral PICA territory, left subacute to chronic left medullary infarct. 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 Neurology recommended transition to DPT for 3 months and Plavix alone after stable on heparin. Patient with resulting right-sided weakness and mild dysarthria.  Hospital course complicated by leukocytosis, acute blood loss anemia. Will set goals for Min A for most tasks with PT/OT and Mod I with SLP.   See Team Conference Notes for weekly updates to the plan of care

## 2016-11-02 ENCOUNTER — Inpatient Hospital Stay (HOSPITAL_COMMUNITY): Payer: Medicaid Other | Admitting: Occupational Therapy

## 2016-11-02 ENCOUNTER — Inpatient Hospital Stay (HOSPITAL_COMMUNITY): Payer: Self-pay | Admitting: Speech Pathology

## 2016-11-02 ENCOUNTER — Encounter (HOSPITAL_COMMUNITY): Payer: Self-pay | Admitting: *Deleted

## 2016-11-02 ENCOUNTER — Inpatient Hospital Stay (HOSPITAL_COMMUNITY): Payer: Self-pay | Admitting: Physical Therapy

## 2016-11-02 DIAGNOSIS — D62 Acute posthemorrhagic anemia: Secondary | ICD-10-CM

## 2016-11-02 LAB — COMPREHENSIVE METABOLIC PANEL
ALK PHOS: 78 U/L (ref 38–126)
ALT: 15 U/L — AB (ref 17–63)
AST: 26 U/L (ref 15–41)
Albumin: 3.3 g/dL — ABNORMAL LOW (ref 3.5–5.0)
Anion gap: 10 (ref 5–15)
BUN: 7 mg/dL (ref 6–20)
CHLORIDE: 102 mmol/L (ref 101–111)
CO2: 23 mmol/L (ref 22–32)
CREATININE: 0.74 mg/dL (ref 0.61–1.24)
Calcium: 9.6 mg/dL (ref 8.9–10.3)
GFR calc Af Amer: >60 mL/min (ref >60)
GFR calc non Af Amer: >60 mL/min (ref >60)
Glucose, Bld: 126 mg/dL — ABNORMAL HIGH (ref 65–99)
Potassium: 4 mmol/L (ref 3.5–5.1)
Sodium: 135 mmol/L (ref 135–145)
Total Bilirubin: 0.6 mg/dL (ref 0.3–1.2)
Total Protein: 8.2 g/dL — ABNORMAL HIGH (ref 6.5–8.1)

## 2016-11-02 LAB — CBC WITH DIFFERENTIAL/PLATELET
BASOS ABS: 0.1 10*3/uL (ref 0.0–0.1)
Basophils Relative: 1 %
EOS ABS: 0.3 10*3/uL (ref 0.0–0.7)
EOS PCT: 3 %
HCT: 39.5 % (ref 39.0–52.0)
HEMOGLOBIN: 12.1 g/dL — AB (ref 13.0–17.0)
LYMPHS PCT: 23 %
Lymphs Abs: 2.6 10*3/uL (ref 0.7–4.0)
MCH: 26.3 pg (ref 26.0–34.0)
MCHC: 30.6 g/dL (ref 30.0–36.0)
MCV: 85.9 fL (ref 78.0–100.0)
Monocytes Absolute: 0.9 10*3/uL (ref 0.1–1.0)
Monocytes Relative: 8 %
NEUTROS PCT: 65 %
Neutro Abs: 7.4 10*3/uL (ref 1.7–7.7)
PLATELETS: 262 10*3/uL (ref 150–400)
RBC: 4.6 MIL/uL (ref 4.22–5.81)
RDW: 19.5 % — ABNORMAL HIGH (ref 11.5–15.5)
WBC: 11.3 10*3/uL — AB (ref 4.0–10.5)

## 2016-11-02 NOTE — Progress Notes (Signed)
Social Work Assessment and Plan Social Work Assessment and Plan  Patient Details  Name: Jordan Thompson MRN: [id number] Date of Birth: 11/28/1965  Today's Date: 11/02/2016  Problem List:  Patient Active Problem List   Diagnosis Date Noted  . 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 Right hemiplegia (HCC)   . Vascular headache   . Hypokalemia   . Alcoholic cirrhosis (HCC)   . GERD without esophagitis   . Leukocytosis   . Acute blood loss anemia   . Benign essential HTN   . Hyperlipidemia   . History of CVA (cerebrovascular accident)   . Prediabetes   . Stroke (cerebrum) (HCC) - acute right cerebellar PICA territory and left paramedian medulla infarcts 10/28/2016   Past Medical History:  Past Medical History:  Diagnosis Date  . Cirrhosis of liver (HCC)   . CVA (cerebral vascular accident) (HCC)   . Hypercholesterolemia   . Hypertension    Past Surgical History:  Past Surgical History:  Procedure Laterality Date  . PARACENTESIS  07/2016   tapped for 12 liters during hospitalization   Social History:  reports that he has never smoked. He has never used smokeless tobacco. He reports that he drinks alcohol. He reports that he does not use drugs.  Family / Support Systems Marital Status: Married How Long?: 18 years Patient Roles: Spouse Spouse/Significant Other: Jordan Thompson [phone]  [phone] Other Supports: M-I-L Anticipated Caregiver: Wife and M-I-L Ability/Limitations of Caregiver: Wife works 8-6pm, M-I-L can provide assist while daughter works Engineer, structural Availability: 24/7 Family Dynamics: Close knit small family who are there for one another. Pt thought he was doing well until this happened. He had quit drinking and recovered from his first stroke. They have friends who are supportive and will check on him.  Social History Preferred language: English Religion:  Cultural Background: No issues Education: High School Read:  Yes Write: Yes Employment Status: Disabled Date Retired/Disabled/Unemployed: applied for disability Fish farm manager Issues: No issues Guardian/Conservator: None-according to MD pt is capable of making his own decisions while here.   Abuse/Neglect Physical Abuse: Denies Verbal Abuse: Denies Sexual Abuse: Denies Exploitation of patient/patient's resources: Denies Self-Neglect: Denies  Emotional Status Pt's affect, behavior adn adjustment status: Pt is motivated to recover from this stroke, he had from his first one. He feels he is being tested and is ok with this, but feels he has to start all over again and is worse this time. He is positive and has a good attitude about this. Recent Psychosocial Issues: other health issues-cirrhosis and hx CVA in 10/06/2016 Pyschiatric History: No issues-do feel he would benefit from seeing neuro-psych while here with all that has happened to him in such a short amount of time. Will make referral for his to see while here. 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 Both have spoken with the MD and feel they have a good understanding of his treatment plan.  Premorbid pt/family roles/activities: Husband, Son in-law, friend, sibling, etc Anticipated changes in roles/activities/participation: resume Pt/family expectations/goals: Pt states: " I want to be able to take care of myself if I can before I leave here."  Wife states: " I will do what I can and my Mother will be there too."  [employer]: Other (Comment) ([facility] Premorbid Home Care/DME Agencies: None Transportation available at discharge: Wife and M-I-L Resource referrals recommended: Neuropsychology, Support group (specify)  Discharge  Planning Living Arrangements: Spouse/significant other Support Systems: Spouse/significant other, Other relatives, Friends/neighbors Type of Residence: Private residence Insurance Resources: [employer] [place] (pending) Surveyor, quantityinancial Resources: Family Youth workerupport Financial Screen Referred: Yes Living Expenses: Lives with family Money Management: Spouse Does the patient have any problems obtaining your medications?: Yes (Describe) (uninsured-applied for medicaid) Home Management: Wife and pt Patient/Family Preliminary Plans: Return home with wife who is there in the evenings and her mother can be there when she is not, if pt requires assistance at discharge from CIR. Will work on a plan. Spoke with Jordan Thompson-Financial counselor regarding applications Social Work Anticipated Follow Up Needs: HH/OP, Support Group  Clinical Impression Pleasant gentleman who has been through much in the past month and still has a good attitude and is optimistic regarding his recovery. He is ready to start rehab and is glad to be here. Feel would benefit from seeing Neuro-psych while here will make referral. Work with on medicaid and disability applications. Await team's evaluations.  Lucy Chrisupree, Pradyun Ishman G 11/02/2016, 11:11 AM

## 2016-11-02 NOTE — Evaluation (Signed)
Speech Language Pathology Assessment and Plan  Patient Details  Name: Jordan Thompson  MRN: [id number] Date of Birth: Sep 14, 1965  SLP Diagnosis: Voice disorder  Rehab Potential: Good ELOS: 7-10 days     Today's Date: 11/02/2016 SLP Individual Time: 1505-1600 SLP Individual Time Calculation (min): 55 min   Problem List:  Patient Active Problem List   Diagnosis Date Noted  . 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 Right hemiplegia ([place])   . Vascular headache   . Hypokalemia   . Alcoholic cirrhosis ([place])   . GERD without esophagitis   . Leukocytosis   . Acute blood loss anemia   . Benign essential HTN   . Hyperlipidemia   . History of CVA (cerebrovascular accident)   . Prediabetes   . Stroke (cerebrum) ([place]) - acute right cerebellar PICA territory and left paramedian medulla infarcts 10/28/2016   Past Medical History:  Past Medical History:  Diagnosis Date  . Cirrhosis of liver ([place])   . CVA (cerebral vascular accident) ([place])   . Hypercholesterolemia   . Hypertension    Past Surgical History:  Past Surgical History:  Procedure Laterality Date  . PARACENTESIS  07/2016   tapped for 12 liters during hospitalization    Assessment / Plan / Recommendation Clinical Impression   Jordan Thompson a 51 y.o.malewith pmh of hypertension, hypercholesterolemia, recent diagnosis of alcoholic cirrhosis ( admitted for decompensation 07/2016), L-PCA CVA 10/06/16--no residual deficits but requiring walker for ambulation. He was admitted via  [facility] on 10/27/16 with RUE/RLE weakness and numbness. History taken from chart review and patient. He was transferred to [facility] further workup. Neurology was consulted and stroke workup initiated. MRI brain performed on 8/30 reviewed, showing left medullary infarct. Per report, acute infarct in left paramedian medullary, acute and subacute bilateral PICA territory, left subacute to chronic left medullary  infarct. 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 Neurology recommended transition to DPT for 3 months and Plavix alone after stable on heparin. Patient with resulting right-sided weakness and mild dysarthria.  Cardiac echo with EF 60-65% with no wall abnormality. Hospital course complicated by leukocytosis, acute blood loss anemia. He used a rolling walker PTA. Lives with his wife who works during the day. SLP evaluation completed on 11/02/2016 with the following results:  Pt presents with hoarse vocal quality which does not impact intelligibility.  Cognitive evaluation was unremarkable for acute dysfunction with pt scoring 19/22 on the MoCA-blind (n>/=18).  Speech was fluent and free from word finding impairment or dysarthria.  Pt would benefit from ENT consult as an outpatient if his voice does not improve within the next month or two.  Pt would benefit from brief ST follow up while inpatient for education and carryover of vocal hygiene techniques to preserve vocal quality and prevent vocal fold abuse.      Skilled Therapeutic Interventions          Cognitive-linguistic evaluation completed with results and recommendations reviewed with pt.   SLP initiated vocal hygiene education, emphasizing the need for periods of vocal rest during the day and adequate hydration.  Pt verbalized understanding and all questions were answered to his satisfaction at this time.  Pt was left in bed with call bell within reach.     SLP Assessment  Patient will need skilled Speech Lanaguage Pathology Services during CIR admission    Recommendations  Recommended Consults: Consider ENT evaluation Recommendations for Other Services: Neuropsych consult Patient destination:  Home Follow up Recommendations: Outpatient SLP Equipment Recommended: None recommended by SLP    SLP Frequency 1 to 3 out of 7 days   SLP Duration  SLP Intensity  SLP Treatment/Interventions 7-10 days   Minumum of 1-2 x/day, 30 to 90 minutes  Cueing hierarchy;Functional tasks;Internal/external aids;Patient/family education;Multimodal communication approach;Speech/Language facilitation    Pain Pain Assessment Pain Assessment: No/denies pain  Prior Functioning Cognitive/Linguistic Baseline: Within functional limits Type of Home: House  Lives With: Spouse Available Help at Discharge: Family Vocation: Unemployed  Function:  Eating Eating                 Cognition Comprehension Comprehension assist level: Follows complex conversation/direction with extra time/assistive device  Expression   Expression assist level: Expresses basic 90% of the time/requires cueing < 10% of the time.  Social Interaction Social Interaction assist level: Interacts appropriately with others with medication or extra time (anti-anxiety, antidepressant).  Problem Solving Problem solving assist level: Solves complex problems: With extra time  Memory Memory assist level: More than reasonable amount of time   Short Term Goals: Week 1: SLP Short Term Goal 1 (Week 1): STG=LTG due to ELOS for ST   Refer to Care Plan for Long Term Goals  Recommendations for other services: Neuropsych  Discharge Criteria: Patient will be discharged from SLP if patient refuses treatment 3 consecutive times without medical reason, if treatment goals not met, if there is a change in medical status, if patient makes no progress towards goals or if patient is discharged from hospital.  The above assessment, treatment plan, treatment alternatives and goals were discussed and mutually agreed upon: by patient  Emilio Math 11/02/2016, 4:32 PM

## 2016-11-02 NOTE — Progress Notes (Signed)
Jordan Fennel, MD Physician Signed Physical Medicine and Rehabilitation  Consult Note Date of Service: 10/30/2016 11:50 AM  Related encounter: Admission (Discharged) from 10/28/2016 in [facility] 5 [facility] NE[facility]O [facility]ICAL     Expand All Collapse All   [] Hide copied text [] Hover for attribution information      Physical Medicine and Rehabilitation Consult Reason for Consult: Stroke Referring Physician: Dr. Roda Shutters   HPI: Jordan Thompson is a 51 y.o. 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 History taken from chart review and patient. He was transferred to [facility] further workup. Neurology was consulted and stroke workup initiated. MRI brain performed on 8/30 reviewed, showing left medullary infarct. Per report, acute infarct in left paramedian medullary, acute and subacute bilateral PICA territory, left subacute to chronic left medullary infarct. 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 Neurology recommended transition to DPT for 3 months and Plavix alone after stable on heparin. Patient with resulting right-sided weakness. He did not receive TPA. Hospital course complicated by leukocytosis, acute blood loss anemia. He used a rolling walker PTA.  Lives with his wife who works during the day.    Review of Systems  Neurological: Positive for sensory change and focal weakness.  All other systems reviewed and are negative.      Past Medical History:  Diagnosis Date  . Cirrhosis of liver ([facility])   . CVA (cerebral vascular accident) ([facility])   . Hypercholesterolemia   . Hypertension    No past surgical history on file.      Family History  Problem Relation Age of Onset  . Hypertension Mother   . Hypertension Father    Social History:  reports that he has  never smoked. He has never used smokeless tobacco. He reports that he drinks alcohol. He reports that he does not use drugs. Allergies:       Allergies  Allergen Reactions  . Aleve [Naproxen Sodium] Other (See Comments)    Pt will not take  . Penicillins Nausea And Vomiting and Other (See Comments)    Pt states had a rxn as a child Has patient had a PCN reaction causing immediate rash, facial/tongue/throat swelling, SOB or lightheadedness with hypotension: No Has patient had a PCN reaction causing severe rash involving mucus membranes or skin necrosis: No Has patient had a PCN reaction that required hospitalization: No Has patient had a PCN reaction occurring within the last 10 years: No If all of the above answers are "NO", then may proceed with Cephalosporin use.  . Pepto-Bismol  [Bismuth Subsalicylate] Nausea Only         Medications Prior to Admission  Medication Sig Dispense Refill  . aspirin 81 MG chewable tablet Chew 81 mg by mouth daily.     Marland Kitchen atorvastatin (LIPITOR) 40 MG tablet Take 40 mg by mouth daily at 6 PM.    . carvedilol (COREG) 3.125 MG tablet Take 3.125 mg by mouth 2 (two) times daily with a meal.    . furosemide (LASIX) 40 MG tablet Take 40 mg by mouth every morning.    . lactulose (CHRONULAC) 10 GM/15ML solution Take 30 g by mouth 3 (three) times daily.    . Multiple Vitamins-Minerals (MULTIVITAMIN PO) Take 1 tablet by mouth daily.    . pantoprazole (PROTONIX) 40 MG tablet Take 40 mg by mouth every morning.    Marland Kitchen  spironolactone (ALDACTONE) 25 MG tablet Take 25 mg by mouth every morning.      Home: Home Living Family/patient expects to be discharged to:: Inpatient rehab Living Arrangements: Spouse/significant other Available Help at Discharge: Family Type of Home: House Home Access: Level entry Home Layout: Two level, Bed/bath upstairs Alternate Level Stairs-Number of Steps: 13 Alternate Level Stairs-Rails: Left Bathroom Shower/Tub:  Health visitor: Standard Home Equipment: Environmental consultant - 2 wheels, Grab bars - tub/shower  Lives With: Spouse  Functional History: Prior Function Level of Independence: Independent with assistive device(s) Comments: wife assisted with entry and exit from shower; using walker since last stroke and doing "his own PT with weights and exercises given by his neice who is a Engineer, civil (consulting) in PT" Functional Status:  Mobility: Bed Mobility Overal bed mobility: Needs Assistance Bed Mobility: Rolling, Sidelying to Sit Rolling: Min assist Sidelying to sit: Mod assist, +2 for safety/equipment Sit to supine: Max assist General bed mobility comments: assist and cues for hand placement coming upright, assist for legs and trunk Transfers Overall transfer level: Needs assistance Equipment used: 2 person hand held assist Transfer via Lift Equipment: Stedy Transfers: Sit to/from Stand, Systems analyst Sit to Stand: Max assist, +2 physical assistance Stand pivot transfers:  (required [place] eo fstedy) Squat pivot transfers: Max assist General transfer comment: assist to block R LE and for standing to facilitate hip extension, stood second attempt with chair back on L side to facilitate L weight shift; pivot to chair with cues for hand placement and L weight shift, lifting help to get to chair Ambulation/Gait General Gait Details: unable  ADL: ADL Overall ADL's : Needs assistance/impaired Eating/Feeding: Set up Grooming: Minimal assistance Upper Body Bathing: Moderate assistance, Sitting Lower Body Bathing: Moderate assistance, Sit to/from stand Upper Body Dressing : Moderate assistance Lower Body Dressing: Maximal assistance, Sit to/from stand Toilet Transfer: +2 for physical assistance, Maximal assistance (required use of Stedy) Toileting- Clothing Manipulation and Hygiene: Maximal assistance Toileting - Clothing Manipulation Details (indicate cue type and reason): once sitting on BSC, pt  able to scoot hips back onto toilet; Able to lean R to attempt hygiene after toielting with min A Functional mobility during ADLs: Maximal assistance, +2 for physical assistance General ADL Comments: Pt attempting to trnasfer to toilet. Pt unable to maintain weight through RLE and required use of Stedy to safely transfer to [facility]  Cognition: Cognition Overall Cognitive Status: No family/caregiver present to determine baseline cognitive functioning Arousal/Alertness: Awake/alert Orientation Level: Oriented X4 Attention: Alternating Alternating Attention: Appears intact Memory: Appears intact Awareness: Appears intact Problem Solving: Appears intact Safety/Judgment: Appears intact Cognition Arousal/Alertness: Awake/alert Behavior During Therapy: WFL for tasks assessed/performed Overall Cognitive Status: No family/caregiver present to determine baseline cognitive functioning General Comments: will further assess  Blood pressure (!) 136/97, pulse 74, temperature 97.9 F (36.6 C), temperature source Oral, resp. rate 12, height 5\' 10"  (1.778 m), weight 97.7 kg (215 lb 6.2 oz), SpO2 99 %. Physical Exam  Vitals reviewed. Constitutional: He is oriented to person, place, and time. He appears well-developed.  Obese  HENT:  Head: Normocephalic and atraumatic.  Eyes: Right eye exhibits no discharge. Left eye exhibits no discharge.  Neck: Normal range of motion. Neck supple.  Cardiovascular: Normal rate and regular rhythm.   Respiratory: Effort normal and breath sounds normal.  GI: Soft. Bowel sounds are normal.  Musculoskeletal: He exhibits no edema or tenderness.  Neurological: He is alert and oriented to person, place, and time.  Motor: LUE/LLE: 5/5 proximal  to dsital RLUE/RLE: 0/5 proximal to distal No increase in tone DTRs 3+ LUE/LLE Sensation diminished to light touch RUE/RLE  Skin: Skin is warm and dry.  Psychiatric: He has a normal mood and affect. His behavior is normal.     Lab Results Last 24 Hours       Results for orders placed or performed during the hospital encounter of 10/28/16 (from the past 24 hour(s))  Heparin level (unfractionated)     Status: Abnormal   Collection Time: 10/29/16  8:01 PM  Result Value Ref Range   Heparin Unfractionated 0.15 (L) 0.30 - 0.70 IU/mL  CBC     Status: Abnormal   Collection Time: 10/30/16  2:42 AM  Result Value Ref Range   WBC 12.2 (H) 4.0 - 10.5 K/uL   RBC 4.23 4.22 - 5.81 MIL/uL   Hemoglobin 11.3 (L) 13.0 - 17.0 g/dL   HCT 40.9 (L) 81.1 - 91.4 %   MCV 85.8 78.0 - 100.0 fL   MCH 26.7 26.0 - 34.0 pg   MCHC 31.1 30.0 - 36.0 g/dL   RDW 78.2 (H) 95.6 - 21.3 %   Platelets 219 150 - 400 K/uL  Basic metabolic panel     Status: Abnormal   Collection Time: 10/30/16  2:42 AM  Result Value Ref Range   Sodium 138 135 - 145 mmol/L   Potassium 3.9 3.5 - 5.1 mmol/L   Chloride 106 101 - 111 mmol/L   CO2 24 22 - 32 mmol/L   Glucose, Bld 107 (H) 65 - 99 mg/dL   BUN <5 (L) 6 - 20 mg/dL   Creatinine, Ser 0.86 0.61 - 1.24 mg/dL   Calcium 8.7 (L) 8.9 - 10.3 mg/dL   GFR calc non Af Amer >60 >60 mL/min   GFR calc Af Amer >60 >60 mL/min   Anion gap 8 5 - 15  Heparin level (unfractionated)     Status: Abnormal   Collection Time: 10/30/16  2:42 AM  Result Value Ref Range   Heparin Unfractionated 0.23 (L) 0.30 - 0.70 IU/mL      Imaging Results (Last 48 hours)  [name] Contrast  Result Date: 10/28/2016 CLINICAL DATA:  Right arm and leg weakness with numbness. Dysarthria. Left vertebral artery occlusion on CTA with thrombus at the vertebrobasilar junction. EXAM: MRI HEAD WITHOUT CONTRAST TECHNIQUE: Multiplanar, multiecho pulse sequences of the brain and surrounding structures were obtained without intravenous contrast. COMPARISON:  Head CT 10/27/2016.  Head and neck CTA 10/28/2016. FINDINGS: Brain: There is an acute infarct in the left paramedian medulla measuring 14 x 7 mm. There are patchy  acute to early subacute infarcts in the right greater than left cerebellar hemispheres involving the PICA territories, with the largest confluent infarct measuring 3 cm on the right. A moderately large left PICA territory infarct has more of a late subacute appearance with some early volume loss noted. A similar but smaller late subacute PICA territory infarct is noted on the right. A small amount of chronic petechial blood products are present in these and medial left cerebellum. There is a small late subacute to chronic left posterolateral medullary infarct. No supratentorial infarct is identified. There is mild cerebral atrophy. Scattered punctate foci of T2 hyperintensity in the cerebral white matter bilaterally are nonspecific but compatible with minimal chronic small vessel ischemic disease. No mass, midline shift, or extra-axial fluid collection is present. Vascular: Occluded distal left vertebral artery. Other major intracranial vascular flow voids are preserved. Skull and upper cervical  spine: Unremarkable bone marrow signal. Sinuses/Orbits: Unremarkable orbits. Paranasal sinuses and mastoid air cells are clear. Other: None. IMPRESSION: 1. Acute infarct in the left paramedian medulla. 2. Acute and subacute bilateral PICA territory infarcts as above. 3. Late subacute to chronic left medullary infarct. Electronically Signed   By: Sebastian AcheAllen  Grady M.D.   On: 10/28/2016 15:00     Assessment/Plan: Diagnosis: Left medullary infarct, bilateral cerebellar infarcts (transition from heparin ggt when appropriate) Labs and images independently reviewed.  Records reviewed and summated above. Stroke: Continue secondary stroke prophylaxis and Risk Factor Modification listed below:   Antiplatelet therapy:   Blood Pressure Management:  Continue current medication with prn's with permisive HTN per primary team Statin Agent:   Prediabetes management:   Right sided hemiparesis: fit for orthosis to prevent  contractures (resting hand splint for day, wrist cock up splint at night, PRAFO, etc) Motor recovery: Fluoxetine  1. Does the need for close, 24 hr/day medical supervision in concert with the patient's rehab needs make it unreasonable for this patient to be served in a less intensive setting? Yes 2. Co-Morbidities requiring supervision/potential complications: leukocytosis (cont to monitor for signs and symptoms of infection, further workup if indicated), acute blood loss anemia (transfuse if necessary to ensure appropriate perfusion for increased activity tolerance), HTN (monitor and provide prns in accordance with increased physical exertion and pain),, hypercholesterolemia (cont meds), alcoholic cirrhosis (avoid hepatotoxic meds), CVA 8/18 without residual, prediabetes (Monitor in accordance with exercise and adjust meds as necessary) 3. Due to safety, disease management and patient education, does the patient require 24 hr/day rehab nursing? Yes 4. Does the patient require coordinated care of a physician, rehab nurse, PT (1-2 hrs/day, 5 days/week) and OT (1-2 hrs/day, 5 days/week) to address physical and functional deficits in the context of the above medical diagnosis(es)? Yes Addressing deficits in the following areas: balance, endurance, locomotion, strength, transferring, bathing, dressing, toileting and psychosocial support 5. Can the patient actively participate in an intensive therapy program of at least 3 hrs of therapy per day at least 5 days per week? Yes 6. The potential for patient to make measurable gains while on inpatient rehab is excellent 7. Anticipated functional outcomes upon discharge from inpatient rehab are min assist and mod assist  with PT, min assist and mod assist with OT, n/a with SLP. 8. Estimated rehab length of stay to reach the above functional goals is: 19-23 days. 9. Does the patient have adequate social supports and living environment to accommodate these discharge  functional goals? No 10. Anticipated D/C setting: Other 11. Anticipated post D/C treatments: SNF 12. Overall Rehab/Functional Prognosis: good  RECOMMENDATIONS: This patient's condition is appropriate for continued rehabilitative care in the following setting: CIR Patient has agreed to participate in recommended program. Yes Note that insurance prior authorization may be required for reimbursement for recommended care.  Comment: Rehab Admissions Coordinator to follow up.  Maryla MorrowAnkit Patel, MD, [place] DomFAAPMR 10/30/2016

## 2016-11-02 NOTE — Plan of Care (Signed)
Problem: Food- and Nutrition-Related Knowledge Deficit (NB-1.1) Goal: Nutrition education Formal process to instruct or train a patient/client in a skill or to impart knowledge to help patients/clients voluntarily manage or modify food choices and eating behavior to maintain or improve health. Outcome: Completed/Met Date Met: 11/02/16 Nutrition Education Note  RD consulted for nutrition education regarding a Heart Healthy diet/prediabetes.   Lipid Panel     Component Value Date/Time   CHOL 88 10/29/2016 0220   TRIG 104 10/29/2016 0220   HDL 17 (L) 10/29/2016 0220   CHOLHDL 5.2 10/29/2016 0220   VLDL 21 10/29/2016 0220   LDLCALC 50 10/29/2016 0220    RD provided "Heart Healthy Nutrition Therapy" and "Counting Carbohydrates for People with Diabetes" handout from the Academy of Nutrition and Dietetics. Reviewed patient's dietary recall. Provided examples on ways to decrease sodium and fat intake in diet. Discouraged intake of high salt containing foods. Encouraged fresh fruits and vegetables. Discussed diabetic friendly drink options. Teach back method used.  Expect good compliance.  Body mass index is 30.16 kg/m. Pt meets criteria for class I obesity based on current BMI.  Current diet order is heart healthy/carbohydrate modified, patient is consuming approximately 100% of meals at this time. Labs and medications reviewed. No further nutrition interventions warranted at this time. RD contact information provided. If additional nutrition issues arise, please re-consult RD.  Corrin Parker, MS, RD, LDN Pager # [phone] After hours/ weekend pager # [phone]

## 2016-11-02 NOTE — Progress Notes (Signed)
[place] PHYSICAL MEDICINE & REHABILITATION     PROGRESS NOTE  Subjective/Complaints:  Pt seen laying in bed this AM.  He slept well overnight.  He is ready yo begin therapies this AM and really want to take a shower.    ROS: Denies CP, SOB, N/V/D.  Objective: Vital Signs: Blood pressure 127/78, pulse 81, temperature 98.1 F (36.7 C), temperature source Oral, resp. rate 17, height 5\' 10"  (1.778 m), weight 95.3 kg (210 lb 3.2 oz), SpO2 96 %. No results found.  Recent Labs  10/31/16 0320 11/01/16 0334  WBC 12.1* 11.3*  HGB 11.3* 11.5*  HCT 36.1* 36.5*  PLT 229 245    Recent Labs  10/31/16 0320 11/01/16 0334  NA 139 137  K 3.7 3.6  CL 107 104  GLUCOSE 103* 107*  BUN <5* 7  CREATININE 0.64 0.80  CALCIUM 9.0 9.1   CBG (last 3)  No results for input(s): GLUCAP in the last 72 hours.  Wt Readings from Last 3 Encounters:  11/02/16 95.3 kg (210 lb 3.2 oz)  10/28/16 97.7 kg (215 lb 6.2 oz)    Physical Exam:  BP 127/78 (BP Location: Left Arm)   Pulse 81   Temp 98.1 F (36.7 C) (Oral)   Resp 17   Ht 5\' 10"  (1.778 m)   Wt 95.3 kg (210 lb 3.2 oz)   SpO2 96%   BMI 30.16 kg/m  Constitutional: He appears well-developed and well-nourished. No distress.  HENT: Normocephalic and atraumatic.  Eyes: EOM are normal. No discharge.  Cardiovascular: Normal rate, regular rhythm.  No JVD Respiratory: Effort normal and breath sounds normal. GI: Bowel sounds are normal. He exhibits no distension. Musculoskeletal: He exhibits no edema or tenderness.  Neurological: He is alert and oriented.  Dysphonia.  Right facial weakness.  Verbose.   RUE/RLE: 0/5 proximal to distal LUE/LLE: 5/5 proximal to distal Skin: Skin is warm and dry. He is not diaphoretic.  Psychiatric: He has a normal mood and affect. His behavior is normal. Thought content normal.   Assessment/Plan: 1. Functional deficits secondary to left medullary infarct, bilateral cerebellar infarcts which require 3+ hours per  day of interdisciplinary therapy in a comprehensive inpatient rehab setting. Physiatrist is providing close team supervision and 24 hour management of active medical problems listed below. Physiatrist and rehab team continue to assess barriers to discharge/monitor patient progress toward functional and medical goals.  Function:  Bathing Bathing position      Bathing parts      Bathing assist        Upper Body Dressing/Undressing Upper body dressing                    Upper body assist        Lower Body Dressing/Undressing Lower body dressing                                  Lower body assist        Toileting Toileting          Toileting assist     Transfers Chair/bed transfer             Locomotion Ambulation           Wheelchair          Cognition Comprehension Comprehension assist level: Follows complex conversation/direction with no assist  Expression Expression assist level: Expresses complex ideas: With no assist  Social Interaction Social Interaction assist level: Interacts appropriately with others - No medications needed.  Problem Solving Problem solving assist level: Solves complex problems: Recognizes & self-corrects  Memory Memory assist level: Complete Independence: No helper    Medical Problem List and Plan: 1.  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   Begin CIR  WHO/PRAFO ordered 2.  DVT Prophylaxis/Anticoagulation: Pharmaceutical: Lovenox--monitor platelets for stability.  3. HA/Pain Management: tylenol prn for HA.  4. Mood: LCSW to follow for evaluation and support. Team to provide ego support.  5. Neuropsych: This patient is capable of making decisions on his own behalf. 6. Skin/Wound Care: routine pressure relief measures.  7. Fluids/Electrolytes/Nutrition: Monitor I/Os  BMP pending 8. HTN: Monitor BP bid--continue lasix and aldactone.   Monitor  with increase mobility 9. Cirrhosis of liver: has been set up with GI at [facility]. New diagnosis post decompensation. Resumed low salt restrictions with lasix and aldactone.   Decreased lactulose to bid and check ammonia levels next week (recent level 28) and frequent loose stools reported.   Will continue to monitor for encephalopathy.  Monitor weights daily. 2 gram tylenol max/day 9. Leucocytosis: Monitor for signs of infection--likely reactive.   WBCs 11.3 on 9/3, pending for today  Cont to monitor 10.  Prediabetes: Hgb A1c- 6.1.  Added CM restrictions. Consulted dietitian to reinforce education.   Monitor with increased mobility 11. ABLA  Hb 11.5 on 9/3, pending for today  Cont to montior  LOS (Days) 1 A FACE TO FACE EVALUATION WAS PERFORMED  Ankit Karis Juba 11/02/2016 8:23 AM

## 2016-11-02 NOTE — Evaluation (Signed)
Physical Therapy Assessment and Plan  Patient Details  Name: Jordan Thompson MRN: [id number] Date of Birth: Apr 10, 1965  PT Diagnosis: Abnormal posture, Coordination disorder, Difficulty walking, Hemiplegia dominant, Impaired sensation and Muscle weakness Rehab Potential: Good ELOS: 20-24 days   Today's Date: 11/02/2016 PT Individual Time: 1100-1200 PT Individual Time Calculation (min): 60 min    Problem List:  Patient Active Problem List   Diagnosis Date Noted  . 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 Right hemiplegia ([place])   . Vascular headache   . Hypokalemia   . Alcoholic cirrhosis ([place])   . GERD without esophagitis   . Leukocytosis   . Acute blood loss anemia   . Benign essential HTN   . Hyperlipidemia   . History of CVA (cerebrovascular accident)   . Prediabetes   . Stroke (cerebrum) ([place]) - acute right cerebellar PICA territory and left paramedian medulla infarcts 10/28/2016    Past Medical History:  Past Medical History:  Diagnosis Date  . Cirrhosis of liver ([place])   . CVA (cerebral vascular accident) ([place])   . Hypercholesterolemia   . Hypertension    Past Surgical History:  Past Surgical History:  Procedure Laterality Date  . PARACENTESIS  07/2016   tapped for 12 liters during hospitalization    Assessment & Plan Clinical Impression: Patient is a 51 y.o.malewith pmh of hypertension, hypercholesterolemia, recent diagnosis of alcoholic cirrhosis ( admitted for decompensation 07/2016), L-PCA CVA 10/06/16--no residual deficits but requiring walker for ambulation. He was admitted via  [facility] on 10/27/16 with RUE/RLE weakness and numbness. History taken from chart review and patient. He was transferred to [facility] further workup. Neurology was consulted and stroke workup initiated. MRI brain performed on 8/30 reviewed, showing left medullary infarct. Per report, acute infarct in left paramedian medullary, acute and subacute  bilateral PICA territory, left subacute to chronic left medullary infarct. 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 Neurology recommended transition to DPT for 3 months and Plavix alone after stable on heparin. Patient with resulting right-sided weakness and mild dysarthria.  Cardiac echo with EF 60-65% with no wall abnormality. Hospital course complicated by leukocytosis, acute blood loss anemia. He used a rolling walker PTA. Lives with his wife who works during the day.  Patient transferred to CIR on 11/01/2016.   Patient currently requires total with mobility secondary to muscle weakness, decreased cardiorespiratoy endurance, abnormal tone, decreased coordination and decreased sitting balance, decreased standing balance, decreased postural control, hemiplegia and decreased balance strategies.  Prior to hospitalization, patient was independent  with mobility and lived with Spouse in a House home.  Home access is  Level entry.  Patient will benefit from skilled PT intervention to maximize safe functional mobility, minimize fall risk and decrease caregiver burden for planned discharge home with 24 hour assist.  Anticipate patient will benefit from follow up [facility] at discharge.  PT - End of Session Activity Tolerance: Tolerates < 10 min activity, no significant change in vital signs Endurance Deficit: Yes Endurance Deficit Description: decreased PT Assessment Rehab Potential (ACUTE/IP ONLY): Good PT Barriers to Discharge: [place] home environment;Decreased caregiver support;Home environment access/layout;Lack of/limited family support PT Barriers to Discharge Comments: unsure if wife or caregiver can physically assist if necessary, but plans to have 24/7  supervision at  least PT Patient demonstrates impairments in the following area(s): Balance;Endurance;Motor PT Transfers Functional Problem(s): Bed Mobility;Bed to Chair;Car;Furniture;Floor PT Locomotion Functional Problem(s): Stairs;Wheelchair Mobility;Ambulation PT Plan PT Intensity: Minimum of 1-2 x/day ,45 to 90 minutes PT Frequency: 5 out of 7 days PT Duration Estimated Length of Stay: 20-24 days PT Treatment/Interventions: Ambulation/gait training;Disease management/prevention;Pain management;Visual/perceptual remediation/compensation;Stair training;Wheelchair propulsion/positioning;Therapeutic Activities;Patient/family education;DME/adaptive equipment instruction;Balance/vestibular training;Cognitive remediation/compensation;Functional electrical stimulation;Psychosocial support;Therapeutic Exercise;UE/LE Strength taining/ROM;Skin care/wound management;Functional mobility training;Community reintegration;Discharge planning;Neuromuscular re-education;Splinting/orthotics;UE/LE Coordination activities PT Transfers Anticipated Outcome(s): Supervision PT Locomotion Anticipated Outcome(s): Mod A  PT Recommendation Recommendations for Other Services: Neuropsych consult Follow Up Recommendations: Home health PT Patient destination: Home Equipment Recommended: To be determined  Skilled Therapeutic Intervention  Pt supine in bed upon arrival agreeable to therapy, no c/o pain. Worked on functional mobility as detailed below including bed mobility, transfers, and w/c mobility. Pt required multiple seated rest breaks secondary to fatigue and c/o dizziness, vital signs stable at that time and no signs or symptoms of physiologic intolerance to OOB activity. Per pt's report, this is normal for him since being in hospital and he just has to move slowly.   Pt requesting to use toilet at end of session and transferred w/c to toilet via stedy, ended session w/ bathroom call bell within reach and all needs  met. NT and RN made aware of status and pt in agreement to call prior to getting off toilet.   PT instructed patient in PT Evaluation and initiated treatment intervention; see below for results. PT educated patient in [place], rehab potential, rehab goals, and discharge recommendations.   PT Evaluation Precautions/Restrictions Precautions Precautions: Fall Restrictions Weight Bearing Restrictions: No General   Vital SignsTherapy Vitals Pulse Rate: 94 BP: 120/84 Patient Position (if appropriate): Sitting Oxygen Therapy SpO2: 99 % O2 Device: Not Delivered Pain Pain Assessment Pain Assessment: No/denies pain Home Living/Prior Functioning Home Living Living Arrangements: Spouse/significant other Available Help at Discharge: Family Type of Home: House Home Access: Level entry Home Layout: Two level;Bed/bath upstairs Alternate Level Stairs-Number of Steps: 13 Bathroom Shower/Tub: Multimedia programmer: Standard  Lives With: Spouse Prior Function Level of Independence: Independent with basic ADLs;Independent with gait;Independent with homemaking with ambulation  Able to Take Stairs?: Yes Driving: Yes Vocation: Unemployed Vocation Requirements: Seeking disability  Leisure: Hobbies-yes (Comment) Comments: Enjoys Haematologist, housework, etc  Vision/Perception  Geologist, engineering: Within Functional Limits Praxis Praxis: Intact  Cognition Overall Cognitive Status: Within Functional Limits for tasks assessed Arousal/Alertness: Awake/alert Orientation Level: Oriented X4 Attention: Selective Selective Attention: Appears intact Behaviors: Perseveration Safety/Judgment: Appears intact Sensation Sensation Light Touch: Impaired by gross assessment Stereognosis: Not tested Hot/Cold: Not tested Proprioception: Impaired by gross assessment Coordination Gross Motor Movements are Fluid and Coordinated: Yes Fine Motor Movements are Fluid and Coordinated: No Finger Nose  Finger Test: Increased shakiness Motor  Motor Motor: Hemiplegia;Abnormal tone Motor - Skilled Clinical Observations: R hemi, increased R HS tone, R clonus present 3-4 beats  Mobility Bed Mobility Bed Mobility: Rolling Right;Rolling Left;Supine to Sit;Sit to Supine Rolling Right: 5: Supervision Rolling Right Details: Verbal cues for technique;Verbal cues for sequencing Rolling Left: 2: Max assist Rolling Left Details: Manual facilitation for placement;Manual facilitation for weight bearing;Manual facilitation for weight shifting;Verbal cues for technique;Verbal cues for sequencing Supine to Sit: 4: Min assist Supine to Sit Details: Manual facilitation for placement;Manual facilitation for weight bearing;Manual facilitation for weight shifting;Verbal cues for technique;Verbal cues for sequencing Sit to Supine: 4: Min assist Sit to Supine - Details: Manual facilitation for placement;Manual facilitation for  weight bearing;Verbal cues for technique;Verbal cues for sequencing Transfers Transfers: Yes Sit to Stand: 3: Mod assist Sit to Stand Details: Manual facilitation for weight shifting;Manual facilitation for weight bearing Sit to Stand Details (indicate cue type and reason): Sit<>stand within steady Stand to Sit: 4: Min guard Stand Pivot Transfers: 1: +1 Total assist Stand Pivot Transfer Details (indicate cue type and reason): Stand pivot via stedy Lateral/Scoot Transfers: 4: Min assist Lateral/Scoot Transfer Details: Visual cues/gestures for sequencing;Verbal cues for technique;Verbal cues for sequencing;Verbal cues for safe use of DME/AE;Manual facilitation for weight shifting;Manual facilitation for placement Lateral/Scoot Transfer Details (indicate cue type and reason): via slide board transfer Transfer via Lift Equipment: Medical sales representative Ambulation: No Gait Gait: No Stairs / Additional Locomotion Stairs: No Architect: Yes Wheelchair  Assistance: 5: Investment banker, operational Details: Verbal cues for technique;Verbal cues for sequencing Wheelchair Propulsion: Left upper extremity;Left lower extremity Wheelchair Parts Management: Supervision/cueing Distance: 150'  Trunk/Postural Assessment  Cervical Assessment Cervical Assessment: Exceptions to [facility] (Forward head posture) Thoracic Assessment Thoracic Assessment: Exceptions to [facility] (Rounded shoulder posture) Lumbar Assessment Lumbar Assessment: Exceptions to [facility] (Posterior pelvic tilt) Postural Control Postural Control: Deficits on evaluation (decreased righting ability w/ perturbations to the R)  Balance Balance Balance Assessed: Yes Static Sitting Balance Static Sitting - Balance Support: Left upper extremity supported Static Sitting - Level of Assistance: 5: Stand by assistance Dynamic Sitting Balance Dynamic Sitting - Balance Support: Feet supported;Left upper extremity supported Dynamic Sitting - Level of Assistance: 5: Stand by assistance Extremity Assessment  RUE Assessment RUE Assessment: Not tested (defer to OT) LUE Assessment LUE Assessment: Not tested (defer to OT) RLE Assessment RLE Assessment: Exceptions to [facility] (0/5 globally) LLE Assessment LLE Assessment: Within Functional Limits   See Function Navigator for Current Functional Status.   Refer to Care Plan for Long Term Goals  Recommendations for other services: Neuropsych  Discharge Criteria: Patient will be discharged from PT if patient refuses treatment 3 consecutive times without medical reason, if treatment goals not met, if there is a change in medical status, if patient makes no progress towards goals or if patient is discharged from hospital.  The above assessment, treatment plan, treatment alternatives and goals were discussed and mutually agreed upon: by patient  Shawnta Zimbelman K Arnette 11/02/2016, 12:49 PM

## 2016-11-02 NOTE — Progress Notes (Signed)
[name], MD Physician Signed Physical Medicine and Rehabilitation  PMR Pre-admission Date of Service: 11/01/2016 11:38 AM  Related encounter: Admission (Discharged) from 10/28/2016 in [facility] 5 [facility]O [facility]       [] Hide copied text PMR Admission Coordinator Pre-Admission Assessment  Patient: Jordan Thompson is an 51 y.o., male MRN: [id number] DOB: 04/19/1965 Height: 5\' 10"  (177.8 cm) Weight: 97.7 kg (215 lb 6.2 oz)                                                                                                                                                  Insurance Information HMO:     PPO:      PCP:      IPA:      80/20:      OTHER:  PRIMARY: uninsured       Policy#:       Subscriber:  CM Name:       Phone#:      Fax#:  Pre-Cert#:       Employer:  Benefits:  Phone #:      Name:  Eff. Date:      Deduct:       Out of Pocket Max:       Life Max:  CIR:       SNF:  Outpatient:      Co-Pay:  Home Health:       Co-Pay:  DME:      Co-Pay:  Providers:  Artist Following: Christia Reading  Medicaid Application Date:       Case Manager:  Disability Application Date:       Case Worker:   Emergency Contact Information        Contact Information    Name Relation Home Work Mobile   Gill,Wendy Spouse [phone]  [phone]     Current Medical History  Patient Admitting Diagnosis: Left medullary infarct, bilateral cerebellar infarcts  History of Present Illness: Jordan Thompson a 51 y.o.malewith pmh of hypertension, hypercholesterolemia, alcoholic cirrhosis, 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 He was admitted via [facility] on 10/27/16 with RUE/RLE weakness and numbness. History taken from chart review and patient. He was transferred to [facility] further workup. Neurology was consulted and stroke workup initiated. MRI brain performed on 10/28/16 reviewed, showing left  medullary infarct. Per report, acute infarct in left paramedian medullary, acute and subacute bilateral PICA territory, left subacute to chronic left medullary infarct. 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 Neurology recommended transition to DPT for 3 months and Plavix alone after stable on heparin. Patient with  resulting right-sided weakness and mild dysarthria. Cardiac echo with EF 60-65% with no wall abnormality. Hospital course complicated by leukocytosis, acute blood loss anemia. He used a rolling walker PTA. Lives with his wife who works during the day and has a supportive family.   NIH Total: 7  Past Medical History      Past Medical History:  Diagnosis Date  . Cirrhosis of liver (HCC)   . CVA (cerebral vascular accident) (HCC)   . Hypercholesterolemia   . Hypertension     Family History  family history includes Hypertension in his father and mother.  Prior Rehab/Hospitalizations:  Has the patient had major surgery during 100 days prior to admission? No  Current Medications   Current Facility-Administered Medications:  .  acetaminophen (TYLENOL) tablet 650 mg, 650 mg, Oral, Q4H PRN, 650 mg at 10/31/16 2013 **OR** acetaminophen (TYLENOL) solution 650 mg, 650 mg, Per Tube, Q4H PRN **OR** acetaminophen (TYLENOL) suppository 650 mg, 650 mg, Rectal, Q4H PRN, [name], PA-C .  aspirin EC tablet 81 mg, 81 mg, Oral, Daily, Rinehuls, David L, PA-C, 81 mg at 11/01/16 1002 .  clopidogrel (PLAVIX) tablet 75 mg, 75 mg, Oral, Daily, Rinehuls, David L, PA-C, 75 mg at 11/01/16 1002 .  furosemide (LASIX) tablet 40 mg, 40 mg, Oral, q morning - 10a, Rinehuls, David L, PA-C, 40 mg at 11/01/16 1002 .   lactulose (CHRONULAC) 10 GM/15ML solution 30 g, 30 g, Oral, TID, Rinehuls, David L, PA-C, 30 g at 10/31/16 0935 .  pantoprazole (PROTONIX) EC tablet 40 mg, 40 mg, Oral, q morning - 10a, Rinehuls, David L, PA-C, 40 mg at 11/01/16 1002 .  senna-docusate (Senokot-S) tablet 1 tablet, 1 tablet, Oral, QHS PRN, [name], PA-C .  spironolactone (ALDACTONE) tablet 25 mg, 25 mg, Oral, q morning - 10a, Rinehuls, David L, PA-C, 25 mg at 11/01/16 1002  Patients Current Diet: Diet Heart Room service appropriate? Yes; Fluid consistency: Thin  Precautions / Restrictions Precautions Precautions: Fall Precaution Comments: pusher Restrictions Weight Bearing Restrictions: No   Has the patient had 2 or more falls or a fall with injury in the past year?Yes, 4 falls no major injuries that required hospitalizations   Prior Activity Level Limited Community (1-2x/wk): Prior to admission patient was Mod I with walker in the community and mostly furniture walkedin the house.  He was able to complete self-care tasks and did meal prep for he and his wife.    Home Assistive Devices / Equipment Home Assistive Devices/Equipment: Dan Humphreys (specify type) Home Equipment: Walker - 2 wheels, Grab bars - tub/shower  Prior Device Use: Indicate devices/aids used by the patient prior to current illness, exacerbation or injury? Walker  Prior Functional Level Prior Function Level of Independence: Independent with assistive device(s) Comments: wife assisted with entry and exit from shower; using walker since last stroke and doing "his own PT with weights and exercises given by his neice who is a nurse in PT"  Self Care: Did the patient need help bathing, dressing, using the toilet or eating? Independent  Indoor Mobility: Did the patient need assistance with walking from room to room (with or without device)? Independent  Stairs: Did the patient need assistance with internal or external stairs (with or without  device)? Independent  Functional Cognition: Did the patient need help planning regular tasks such as shopping or remembering to take medications? Needed some help, spouse loaded medication box.  Current Functional Level Cognition  Arousal/Alertness: Awake/alert Overall Cognitive Status: No family/caregiver present  to determine baseline cognitive functioning Orientation Level: Oriented X4 General Comments: will further assess Attention: Alternating Alternating Attention: Appears intact Memory: Appears intact Awareness: Appears intact Problem Solving: Appears intact Safety/Judgment: Appears intact    Extremity Assessment (includes Sensation/Coordination)  Upper Extremity Assessment: Defer to OT evaluation RUE Deficits / Details: Pt with minimal active shoulder elevation and trace elbow flexion with facilitation. Brunstrom I (presynergy)- hand and arm. non-functional RUE at this time RUE Sensation: decreased light touch RUE Coordination: decreased fine motor, decreased gross motor  Lower Extremity Assessment: RLE deficits/detail RLE Deficits / Details: no active movement, but toes move involunarily with stimulation    ADLs  Overall ADL's : Needs assistance/impaired Eating/Feeding: Set up Grooming: Minimal assistance Upper Body Bathing: Moderate assistance, Sitting Lower Body Bathing: Moderate assistance, Sit to/from stand Upper Body Dressing : Moderate assistance Lower Body Dressing: Maximal assistance, Sit to/from stand Toilet Transfer: +2 for physical assistance, Maximal assistance (required use of Stedy) Toileting- Clothing Manipulation and Hygiene: Maximal assistance Toileting - Clothing Manipulation Details (indicate cue type and reason): once sitting on BSC, pt able to scoot hips back onto toilet; Able to lean R to attempt hygiene after toielting with min A Functional mobility during ADLs: Maximal assistance, +2 for physical assistance General ADL Comments: Pt  attempting to trnasfer to toilet. Pt unable to maintain weight through RLE and required use of Stedy to safely transfer to [facility]    Mobility  Overal bed mobility: Needs Assistance Bed Mobility: Rolling, Sidelying to Sit Rolling: Min assist Sidelying to sit: Mod assist, +2 for safety/equipment Sit to supine: Max assist General bed mobility comments: assist and cues for hand placement coming upright, assist for legs and trunk    Transfers  Overall transfer level: Needs assistance Equipment used: 2 person hand held assist Transfer via Lift Equipment: Stedy Transfers: Sit to/from Stand, Systems analyst Sit to Stand: Max assist, +2 physical assistance Stand pivot transfers:  (required [place] eo fstedy) Squat pivot transfers: Max assist General transfer comment: assist to block R LE and for standing to facilitate hip extension, stood second attempt with chair back on L side to facilitate L weight shift; pivot to chair with cues for hand placement and L weight shift, lifting help to get to chair    Ambulation / Gait / Stairs / Wheelchair Mobility  Ambulation/Gait General Gait Details: unable    Posture / Balance Dynamic Sitting Balance Sitting balance - Comments: R bias; at least minguard to close S for safety Balance Overall balance assessment: Needs assistance Sitting balance-Leahy Scale: Poor Sitting balance - Comments: R bias; at least minguard to close S for safety Standing balance support: Single extremity supported Standing balance-Leahy Scale: Zero Standing balance comment: + 2 for standing with R knee and hip blocked and cues/A for L weight shift x about 1-2 minutes x 2 reps    Special needs/care consideration BiPAP/CPAP: No CPM: No Continuous Drip IV: No Dialysis: No         Life Vest: No Oxygen: No Special Bed: No Trach Size: No Wound Vac (area): No       Skin: WDL                                Bowel mgmt: Continent, last BM 10/31/16 Bladder mgmt: Continent,  one instance of ? Retention with need for in a out cath on acute  Diabetic mgmt: HgbA1c 6.1, goal < 7.0  Previous Home Environment Living Arrangements: Spouse/significant other  Lives With: Spouse Available Help at Discharge: Family Type of Home: House Home Layout: Two level, Bed/bath upstairs Alternate Level Stairs-Rails: Left Alternate Level Stairs-Number of Steps: 13 Home Access: Level entry Bathroom Shower/Tub: Health visitorWalk-in shower Bathroom Toilet: Standard Home Care Services: No  Discharge Living Setting Plans for Discharge Living Setting: Patient's home, Lives with (comment) (Spouse ) Type of Home at Discharge: House Discharge Home Layout: Two level, 1/2 bath on main level (Den also on mainlevel that can be converted into a bedroom ) Alternate Level Stairs-Rails: Left Alternate Level Stairs-Number of Steps: 13 Discharge Home Access: Other (comment) (3 steps or ramp ) Discharge Bathroom Shower/Tub: Tub/shower unit Discharge Bathroom Toilet: Standard Discharge Bathroom Accessibility:  (1/2 bath on main-no, full bath upstairs-yes with walker ) Does the patient have any problems obtaining your medications?: Yes (Describe) (uninsured at this time)  Social/Family/Support Systems Patient Roles: Spouse Contact Information: Spouse: Karsten FellsWendy Stange [phone] Anticipated Caregiver: Spouse and mother-in-law Anticipated Caregiver's Contact Information: see above Ability/Limitations of Caregiver: Spouse works  Engineer, structuralCaregiver Availability: 24/7 Discharge Plan Discussed with Primary Caregiver: Yes Is Caregiver In Agreement with Plan?: Yes Does Caregiver/Family have Issues with Lodging/Transportation while Pt is in Rehab?: No  Goals/Additional Needs Patient/Family Goal for Rehab: PT/OT Min-Mod A Expected length of stay: 19-23 days  Cultural Considerations: Presbyterian Dietary Needs: Heart Healthy; Low Salt Equipment Needs: TBD Additional Information: Working with financial  counseling  Pt/Family Agrees to Admission and willing to participate: Yes Program Orientation Provided & Reviewed with Pt/Caregiver Including Roles  & Responsibilities: Yes Additional Information Needs: None Information Needs to be Provided By: N/A  Barriers to Discharge: Home environment access/layout, Other (comments) (Medicaid Potential working with financial counseling )  Decrease burden of Care through IP rehab admission: No  Possible need for SNF placement upon discharge: Potentially  Patient Condition: This patient's medical and functional status has changed since the consult dated: 10/30/16 at 1312 in which the Rehabilitation Physician determined and documented that the patient's condition is appropriate for intensive rehabilitative care in an inpatient rehabilitation facility. See "History of Present Illness" (above) for medical update. Functional changes are: Max +2 transfer assist. Patient's medical and functional status update has been discussed with the Rehabilitation physician and patient remains appropriate for inpatient rehabilitation. Will admit to inpatient rehab today.  Preadmission Screen Completed By:  Fae PippinMelissa Fernando Stoiber, 11/01/2016 11:38 AM ______________________________________________________________________   Discussed status with Dr. Allena KatzPatel on 11/01/16 at 1155 and received telephone approval for admission today.  Admission Coordinator:  Fae PippinMelissa Samayra Hebel, time 1155/Date 11/01/16

## 2016-11-02 NOTE — Care Management Note (Signed)
Inpatient Rehabilitation Center Individual Statement of Services  Patient Name:  Jordan Thompson  Date:  11/02/2016  Welcome to the Inpatient Rehabilitation Center.  Our goal is to provide you with an individualized program based on your diagnosis and situation, designed to meet your specific needs.  With this comprehensive rehabilitation program, you will be expected to participate in at least 3 hours of rehabilitation therapies Monday-Friday, with modified therapy programming on the weekends.  Your rehabilitation program will include the following services:  Physical Therapy (PT), Occupational Therapy (OT), Speech Therapy (ST), 24 hour per day rehabilitation nursing, Therapeutic Recreaction (TR), Neuropsychology, Case Management (Social Worker), Rehabilitation Medicine, Nutrition Services and Pharmacy Services  Weekly team conferences will be held on Wednesday to discuss your progress.  Your Social Worker will talk with you frequently to get your input and to update you on team discussions.  Team conferences with you and your family in attendance may also be held.  Expected length of stay: 20-24 days  Overall anticipated outcome: supervision-mod assist with ambulation  Depending on your progress and recovery, your program may change. Your Social Worker will coordinate services and will keep you informed of any changes. Your Social Worker's name and contact numbers are listed  below.  The following services may also be recommended but are not provided by the Inpatient Rehabilitation Center:   Driving Evaluations  Home Health Rehabiltiation Services  Outpatient Rehabilitation Services  Vocational Rehabilitation   Arrangements will be made to provide these services after discharge if needed.  Arrangements include referral to agencies that provide these services.  Your insurance has been verified to be: Pending Medicaid Your primary doctor is:  Georganna Skeansmelia Wilson  Pertinent information will  be shared with your doctor and your insurance company.  Social Worker:  Dossie DerBecky Greenlee Ancheta, SW [phone] or ([phone]) [phone]  Information discussed with and copy given to patient by: Lucy Chrisupree, Jaelen Soth G, 11/02/2016, 10:49 AM

## 2016-11-02 NOTE — Progress Notes (Signed)
Orthopedic Tech Progress Note Patient Details:  Jordan KnackChristopher Thompson [birth year] [id number]  Patient ID: Jordan Thompson, male   DOB: [birth year], 51 y.o.   MRN: [id number]   Saul FordyceJennifer C Chynah Orihuela 11/02/2016, 8:45 AMCalled [facility] for right Prafo boot , resting hand splint and cockup hand splint.

## 2016-11-02 NOTE — Evaluation (Signed)
Occupational Therapy Assessment and Plan  Patient Details  Name: Jordan Thompson MRN: [id number] Date of Birth: 27-Mar-1965  OT Diagnosis: abnormal posture, hemiplegia affecting dominant side, muscle weakness (generalized) and coordination disorder Rehab Potential: Rehab Potential (ACUTE ONLY): Good ELOS: 21-24 days   Today's Date: 11/02/2016 OT Individual Time: 3295-1884 OT Individual Time Calculation (min): 87 min     Problem List:  Patient Active Problem List   Diagnosis Date Noted  . 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 Right hemiplegia ([place])   . Vascular headache   . Hypokalemia   . Alcoholic cirrhosis ([place])   . GERD without esophagitis   . Leukocytosis   . Acute blood loss anemia   . Benign essential HTN   . Hyperlipidemia   . History of CVA (cerebrovascular accident)   . Prediabetes   . Stroke (cerebrum) ([place]) - acute right cerebellar PICA territory and left paramedian medulla infarcts 10/28/2016    Past Medical History:  Past Medical History:  Diagnosis Date  . Cirrhosis of liver ([place])   . CVA (cerebral vascular accident) ([place])   . Hypercholesterolemia   . Hypertension    Past Surgical History:  Past Surgical History:  Procedure Laterality Date  . PARACENTESIS  07/2016   tapped for 12 liters during hospitalization    Assessment & Plan Clinical Impression: Patient is a 51 y.o. year old male with pmh of hypertension, hypercholesterolemia, recent diagnosis of alcoholic cirrhosis ( admitted for decompensation 07/2016), L-PCA CVA 10/06/16--no residual deficits but requiring walker for ambulation. He was admitted via  [facility] on 10/27/16 with RUE/RLE weakness and numbness. History taken from chart review and patient. He was transferred to [facility] further workup. Neurology was consulted and stroke workup initiated. MRI brain performed on 8/30 reviewed, showing left medullary infarct. Per report, acute infarct in left paramedian  medullary, acute and subacute bilateral PICA territory, left subacute to chronic left medullary infarct. 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 Neurology recommended transition to DPT for 3 months and Plavix alone after stable on heparin. Patient with resulting right-sided weakness and mild dysarthria.  Cardiac echo with EF 60-65% with no wall abnormality. Hospital course complicated by leukocytosis, acute blood loss anemia. He used a rolling walker PTA. Lives with his wife who works during the day. Patient transferred to CIR on 11/01/2016 .    Patient currently requires mod - total a with basic self-care skills and IADL secondary to muscle weakness, decreased cardiorespiratoy endurance, abnormal tone, decreased coordination and decreased motor planning, balance and decreased sitting balance, decreased standing balance, decreased postural control, hemiplegia and decreased balance strategies.  Prior to hospitalization, patient could complete ADLs with modified independent .  Patient will benefit from skilled intervention to decrease level of assist with basic self-care skills prior to discharge home with care partner.  Anticipate patient will require 24 hour supervision and moderate physical assestance and follow up home health.  OT - End of Session Activity Tolerance: Decreased this session Endurance Deficit: Yes Endurance Deficit Description: decreased OT Assessment Rehab Potential (ACUTE ONLY): Good OT Barriers to Discharge: Decreased caregiver support OT Patient demonstrates impairments in the following area(s): Balance;Endurance;Safety;Perception;Sensory OT Basic ADL's Functional Problem(s): Grooming;Bathing;Eating;Dressing;Toileting OT Transfers  Functional Problem(s): Toilet;Tub/Shower OT Additional Impairment(s): Fuctional Use of Upper Extremity  OT Plan OT Intensity: Minimum of 1-2 x/day, 45 to 90 minutes OT Frequency: 5 out of 7 days OT Duration/Estimated Length of Stay: 21-24 days OT Treatment/Interventions: Balance/vestibular training;Cognitive remediation/compensation;Community reintegration;Discharge planning;Functional mobility training;Psychosocial support;Therapeutic Activities;UE/LE Coordination activities;Patient/family education;DME/adaptive equipment instruction;Pain management;UE/LE Strength taining/ROM;Wheelchair propulsion/positioning;Therapeutic Exercise;Self Care/advanced ADL retraining;Neuromuscular re-education OT Self Feeding Anticipated Outcome(s): set up/s OT Basic Self-Care Anticipated Outcome(s): Supervision - min A OT Toileting Anticipated Outcome(s): min A OT Bathroom Transfers Anticipated Outcome(s): min A OT Recommendation Recommendations for Other Services: Neuropsych consult Patient destination: Home Follow Up Recommendations: Home health OT;24 hour supervision/assistance Equipment Recommended: To be determined   Skilled Therapeutic Intervention Upon entering the room, pt seated in wheelchair with no c/o pain but very anxious about therapy session. OT educated pt on OT purpose, POC, and goals with pt verbalizing understanding and agreement. Pt transferred from wheelchair >toilet with max A stand pivot transfer and use of grab bar. Pt needing total A for clothing management and hygiene. STEDY utilized to transfer pt from toilet to shower with pt standing into stedy with mod lifting assistance. Pt seated on TTB while holding onto grab bar and min A for balance. Pt washing self with hand over hand assistance to incorporate R UE into functional tasks. STEDY utilized to transfer pt onto EOB for dressing tasks. Pt needing total A to thread pants and pull over buttocks while standing with mod a for standing  balance. Pt needing mod A for sitting balance with L UE support. Pt leaning to R and posteriorly. Pt performed sit >supine with mod A for B LEs. Pt fatigued at this time and repositioned on bed for hemiplegic support. Call bell and all needed items within reach upon exiting the room.   OT Evaluation Precautions/Restrictions  Precautions Precautions: Fall Restrictions Weight Bearing Restrictions: No Vital Signs Therapy Vitals Temp: 97.9 F (36.6 C) Temp Source: Oral Pulse Rate: 78 Resp: 18 BP: 130/80 Patient Position (if appropriate): Lying Oxygen Therapy SpO2: 98 % O2 Device: Not Delivered Pain Pain Assessment Pain Assessment: No/denies pain Home Living/Prior Functioning Home Living Family/patient expects to be discharged to:: Private residence Living Arrangements: Spouse/significant other Available Help at Discharge: Family Type of Home: House Home Access: Level entry Home Layout: Two level, Bed/bath upstairs Alternate Level Stairs-Number of Steps: 13 Bathroom Shower/Tub: Multimedia programmer: Standard  Lives With: Spouse Prior Function Level of Independence: Independent with basic ADLs, Independent with gait, Independent with homemaking with ambulation  Able to Take Stairs?: Yes Driving: Yes Vocation: Unemployed Vocation Requirements: Seeking disability  Leisure: Hobbies-yes (Comment) Comments: Enjoys Haematologist, housework, etc  ADL   Vision Baseline Vision/History: Wears glasses Wears Glasses: Reading only Patient Visual Report: No change from baseline Vision Assessment?: No apparent visual deficits Praxis Praxis: Intact Cognition Overall Cognitive Status: Within Functional Limits for tasks assessed Arousal/Alertness: Awake/alert Orientation Level: Person;Place;Situation Person: Oriented Place: Oriented Situation: Oriented Year: 2018 Month: September Day of Week: Correct Immediate Memory Recall: Sock;Blue;Bed Memory Recall:  Sock;Blue;Bed Memory Recall Sock: Without Cue Memory Recall Blue: Without Cue Memory Recall Bed: Without Cue Attention: Selective Selective Attention: Appears intact Behaviors: Perseveration Safety/Judgment: Appears intact Sensation Sensation Light Touch: Impaired by gross assessment Stereognosis: Not tested Hot/Cold: Not tested Proprioception: Impaired by gross assessment Coordination Gross Motor Movements are Fluid and Coordinated: No Fine Motor Movements are Fluid and Coordinated: No Coordination and Movement Description: R flaccid hemiplegia Motor  Motor Motor: Hemiplegia;Abnormal tone Mobility  Bed Mobility Bed Mobility: Rolling Right;Rolling Left;Supine to Sit;Sit to Supine Rolling Right: 4: Min  assist Rolling Right Details: Verbal cues for technique;Verbal cues for sequencing Rolling Left: 2: Max assist Rolling Left Details: Manual facilitation for placement;Manual facilitation for weight bearing;Manual facilitation for weight shifting;Verbal cues for technique;Verbal cues for sequencing Supine to Sit: 4: Min assist Sit to Supine: 3: Mod assist Sit to Supine - Details: Manual facilitation for placement;Manual facilitation for weight bearing;Verbal cues for technique;Verbal cues for sequencing Transfers Transfers: Sit to Stand;Stand to Sit Sit to Stand: 2: Max assist Stand to Sit: 2: Max assist  Trunk/Postural Assessment  Cervical Assessment Cervical Assessment: Exceptions to [facility] (forward head) Thoracic Assessment Thoracic Assessment: Exceptions to [facility] (kyphotic) Lumbar Assessment Lumbar Assessment: Exceptions to [facility] (posterior pelvic tilt) Postural Control Postural Control: Deficits on evaluation  Balance Balance Balance Assessed: Yes Static Sitting Balance Static Sitting - Balance Support: No upper extremity supported Static Sitting - Level of Assistance: 4: Min assist;3: Mod assist Dynamic Sitting Balance Dynamic Sitting - Balance Support: Feet  supported;Left upper extremity supported Dynamic Sitting - Level of Assistance: 4: Min assist Extremity/Trunk Assessment RUE Assessment RUE Assessment: Exceptions to [facility] RUE Strength RUE Overall Strength: Deficits;Other (Comment) (0/5 flaccid) RUE Tone RUE Tone: Flaccid LUE Assessment LUE Assessment: Within Functional Limits   See Function Navigator for Current Functional Status.   Refer to Care Plan for Long Term Goals  Recommendations for other services: Neuropsych and Therapeutic Recreation  Pet therapy   Discharge Criteria: Patient will be discharged from OT if patient refuses treatment 3 consecutive times without medical reason, if treatment goals not met, if there is a change in medical status, if patient makes no progress towards goals or if patient is discharged from hospital.  The above assessment, treatment plan, treatment alternatives and goals were discussed and mutually agreed upon: by patient  Gypsy Decant 11/02/2016, 5:07 PM

## 2016-11-02 NOTE — Plan of Care (Signed)
Problem: RH SKIN INTEGRITY Goal: RH STG SKIN FREE OF INFECTION/BREAKDOWN Patients skin will remain free from breakdown or infection with min assist.  Outcome: Progressing No breakdown

## 2016-11-03 ENCOUNTER — Encounter (HOSPITAL_COMMUNITY): Payer: Self-pay | Admitting: Psychology

## 2016-11-03 ENCOUNTER — Inpatient Hospital Stay (HOSPITAL_COMMUNITY): Payer: Self-pay

## 2016-11-03 ENCOUNTER — Inpatient Hospital Stay (HOSPITAL_COMMUNITY): Payer: Self-pay | Admitting: Speech Pathology

## 2016-11-03 ENCOUNTER — Inpatient Hospital Stay (HOSPITAL_COMMUNITY): Payer: Medicaid Other | Admitting: *Deleted

## 2016-11-03 ENCOUNTER — Encounter: Payer: Self-pay | Admitting: *Deleted

## 2016-11-03 ENCOUNTER — Inpatient Hospital Stay (HOSPITAL_COMMUNITY): Payer: Medicaid Other | Admitting: Occupational Therapy

## 2016-11-03 DIAGNOSIS — F4323 Adjustment disorder with mixed anxiety and depressed mood: Secondary | ICD-10-CM

## 2016-11-03 DIAGNOSIS — Z006 Encounter for examination for normal comparison and control in clinical research program: Secondary | ICD-10-CM

## 2016-11-03 NOTE — Progress Notes (Signed)
Physical Therapy Session Note  Patient Details  Name: Jordan KnackChristopher Thompson MRN: [id number] Date of Birth: 1965/07/16  Today's Date: 11/03/2016 PT Individual Time: 0902-1000, [id number] PT Individual Time Calculation (min): 58 min , 45 min  Short Term Goals: Week 1:  PT Short Term Goal 1 (Week 1): Pt will perform sit<>supine w/ supervision PT Short Term Goal 2 (Week 1): Pt will transfer bed<>chair via squat or stand pivot w/ Max A PT Short Term Goal 3 (Week 1): Pt will ambulated 5' w/ Max A using LRAD PT Short Term Goal 4 (Week 1): Pt will tolerate 15 min of upright activity w/o c/o dizziness and w/ stable vital signs  Skilled Therapeutic Interventions/Progress Updates:    Session 1: Pt supine in bed upon PT arrival, agreeable to therapy tx and denies pain. Pt transferred from supine to sitting EOB with min assist to help move R LE, pt using bedrails to pull up. Pt transferred from bed<>w/c to the L with min assist, squat pivot transfer with verbal cues for technique. Pt propelled w/c x 100 ft to the gym with supervision using L hemi technique. Pt performed sit<>stands x 8 throughout session using L handrail for UE support with mod assist, with manual facilitation through R LE for weightbearing. In standing pt worked on lateral weightshifts from L LE to R LE and L LE heel raises, pt requiring manual facilitation for R hip extension and R knee extension to prevent buckling, mod assist. Pt performed squat pivot transfers from w/c<>mat x 2 in each direction, verbal cues for technique, tactile cues for weightshift, pt requiring min assist to transfer to the L and mod assist to the R. Pt propelled w/c back to room with supervision, left seated in w/c at end of session with needs in reach.   Session 2: Pt supine in bed upon PT arrival, agreeable to therapy tx and denies pain. In supine pt performed x 5 bridges, demonstrating some glute activation this session. Pt transferred from supine to sitting with min  assist, verbal cues for technique and manual assist to help move R LE. Pt performed squat pivot transfer bed<>w/c with min assist, verbal cues for w/c set up and technique. Pt propelled w/c x 100 ft to the gym using L hemi technique with supervision. Pt performed x 5 sit<>stands this session with manual facilitation for R LE weightbearing. In standing, focus on weightbearing over R LE, glute activation in standing and standing balance without UE support all with mod to max assist to support R side and prevent R knee buckling. Pt transferred back to bed at end of session, squat pivot with min assist. Pt left supine in bed with needs in reach.   Therapy Documentation Precautions:  Precautions Precautions: Fall Restrictions Weight Bearing Restrictions: No   See Function Navigator for Current Functional Status.   Therapy/Group: Individual Therapy  Cresenciano GenreEmily van Schagen, PT, DPT 11/03/2016, 7:56 AM

## 2016-11-03 NOTE — Progress Notes (Signed)
Speech Language Pathology Discharge Summary  Patient Details  Name: Jordan Thompson MRN: [id number] Date of Birth: 1965/10/26  Today's Date: 11/03/2016 SLP Individual Time: 7017-7939 SLP Individual Time Calculation (min): 27 min   Skilled Therapeutic Interventions:  Pt was seen for skilled ST targeting ongoing education regarding vocal hygiene techniques.  Pt's voice was much clearer and stronger today in comparison to yesterday's therapy session.  Pt reported that he has been increasing his water intake in between therapy sessions per therapist's recommendations.  SLP reinforced the importance of hydration to vocal hygiene as well as the need to incorporate periods of vocal rest into his day.  Pt verbalized understanding and all questions were answered to his satisfaction at this time.  SLP also reinforced recommendations for ENT follow up should vocal quality not improve.  Education is complete.  No further ST needs indicated.    Patient has met 1 of 1 long term goals.  Patient to discharge at overall Modified Independent level.  Reasons goals not met:     Clinical Impression/Discharge Summary:   Pt has made functional gains and is discharging from [place] services having met 1 out of 1 long term goal.  Pt has demonstrated improved understanding of vocal hygiene techniques to prevent further vocal fold dysfunction while awaiting spontaneous neuro recovery.  Pt is in agreement with recommendations for ENT follow up as an outpatient if his voice does not improve over the next month.  Pt may need OP ST follow up as well depending on results of ENT work up.  No further ST needs indicated at this time.    Care Partner:  Caregiver Able to Provide Assistance:  (n/a)  Type of Caregiver Assistance:  (n/a)  Recommendation:  Outpatient SLP  Rationale for SLP Follow Up: Maximize functional communication   Equipment: none recommended by SLP    Reasons for discharge: Treatment goals met    Patient/Family Agrees with Progress Made and Goals Achieved: Yes   Function:  Eating Eating                 Cognition Comprehension Comprehension assist level: Follows complex conversation/direction with extra time/assistive device  Expression   Expression assist level: Expresses complex ideas: With extra time/assistive device  Social Interaction Social Interaction assist level: Interacts appropriately with others with medication or extra time (anti-anxiety, antidepressant).  Problem Solving Problem solving assist level: Solves basic problems with no assist  Memory Memory assist level: Recognizes or recalls 90% of the time/requires cueing < 10% of the time   Emilio Math 11/03/2016, 3:39 PM

## 2016-11-03 NOTE — Evaluation (Signed)
Recreational Therapy Assessment and Plan  Patient Details  Name: Jordan Thompson MRN: [id number] Date of Birth: 03-16-1965 Today's Date: 11/03/2016  Rehab Potential: Good ELOS: 3 weeks   Assessment  Problem List:      Patient Active Problem List   Diagnosis Date Noted  . 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 Right hemiplegia ([place])   . Vascular headache   . Hypokalemia   . Alcoholic cirrhosis ([place])   . GERD without esophagitis   . Leukocytosis   . Acute blood loss anemia   . Benign essential HTN   . Hyperlipidemia   . History of CVA (cerebrovascular accident)   . Prediabetes   . Stroke (cerebrum) ([place]) - acute right cerebellar PICA territory and left paramedian medulla infarcts 10/28/2016    Past Medical History:      Past Medical History:  Diagnosis Date  . Cirrhosis of liver ([place])   . CVA (cerebral vascular accident) ([place])   . Hypercholesterolemia   . Hypertension    Past Surgical History:       Past Surgical History:  Procedure Laterality Date  . PARACENTESIS  07/2016   tapped for 12 liters during hospitalization    Assessment & Plan Clinical Impression: Patient is a 51 y.o. year old male with pmh of hypertension, hypercholesterolemia, recent diagnosis of alcoholic cirrhosis ( admitted for decompensation 07/2016), L-PCA CVA 10/06/16--no residual deficits but requiring walker for ambulation. He was admitted via [facility] on 10/27/16 with RUE/RLE weakness and numbness. History taken from chart review and patient. He was transferred to [facility] further workup. Neurology was consulted and stroke workup initiated. MRI brain performed on 8/30 reviewed, showing left medullary infarct. Per report, acute infarct in left paramedian medullary, acute and subacute bilateral PICA territory, left subacute to chronic left medullary infarct. This was thought to be secondary to left vertebral artery dissection/occlusion  with extension into the vertebrobasilar junction. 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 Neurology recommended transition to DPT for 3 months and Plavix alone after stable on heparin. Patient with resulting right-sided weakness and mild dysarthria. Cardiac echo with EF 60-65% with no wall abnormality. Hospital course complicated by leukocytosis, acute blood loss anemia. He used a rolling walker PTA. Lives with his wife who works during the day. Patient transferred to CIR on 11/01/2016.    Pt presents with decreased activity tolerance, decreased functional mobility, decreased balance, decreased coordination Limiting pt's independence with leisure/community pursuits.   Leisure History/Participation Premorbid leisure interest/current participation: Sports - Biomedical engineer;Nature - [employer] care;Community - Grocery store;Community - Doctor, hospital - Travel (Comment) Expression Interests: Music (Comment) Other Leisure Interests: Television;Cooking/Baking Leisure Participation Style: With Family/Friends Awareness of Community Resources: Good-identify 3 post discharge leisure resources Psychosocial / Spiritual Spiritual Interests: [place]: Cooperative Academic librarian Appropriate for Education?: Yes Recreational Therapy Orientation Orientation -Reviewed with patient: Available activity resources Strengths/Weaknesses Patient Strengths/Abilities: Willingness to participate;Active premorbidly Patient weaknesses: Physical limitations TR Patient demonstrates impairments in the following area(s): Edema;Endurance;Motor;Safety  Plan Rec Therapy Plan Is patient appropriate for Therapeutic Recreation?: Yes Rehab Potential: Good Treatment times per week: Min 1 TR session/group >25 minutes during LOS Estimated Length of Stay: 3  weeks TR Treatment/Interventions: Adaptive equipment instruction;1:1 session;Balance/vestibular training;Functional mobility training;Community reintegration;Cognitive remediation/compensation;Group participation (Comment);Patient/family education;Therapeutic activities;Recreation/leisure participation;Therapeutic exercise;UE/LE Coordination activities;Wheelchair propulsion/positioning Recommendations for other services: Neuropsych  Recommendations for other services:  Neuropsych  Discharge Criteria: Patient will be discharged from TR if patient refuses treatment 3 consecutive times without medical reason.  If treatment goals not met, if there is a change in medical status, if patient makes no progress towards goals or if patient is discharged from hospital.  The above assessment, treatment plan, treatment alternatives and goals were discussed and mutually agreed upon: by patient  [place] 11/03/2016, 4:07 PM

## 2016-11-03 NOTE — Progress Notes (Signed)
Spoke with patient regarding the STROKE AF Research study. Due to the possibility of being randomized to having the LINQ inserted, he is not interested.  Doesn't want any type of invasive procedure at this time.

## 2016-11-03 NOTE — Consult Note (Signed)
Neuropsychological Consultation   Patient:   Jordan KnackChristopher Thompson   DOB:   06-May-1965  MR Number:  [id number]  [facility]  [facility] [address] [facility] [address] [id number] [place] [id number] Dept: [phone] Loc: [phone]           Date of Service:   11/03/2016  Start Time:   8 AM End Time:   9 AM  Provider/Observer:  Arley PhenixJohn Rodenbough, Psy.D.       Clinical Neuropsychologist       Billing Code/Service: [phone] 4 Units  Chief Complaint:    Jordan KnackChristopher Thompson is a 51 year old male with recent dx of alcoholic cirrhosis [facility].  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  10/26/2016 started symptoms on right side of body and was admitted on 10/27/2016 due to RUE/RLE weakness and numbness.  [facility]  MRI showed left medullary infarct.  Patient with resulting right side hemiparesis.    Reason for Service:  Jordan KnackChristopher Carino was referred for neuropsychological consultation due to coping issues.  Cognitively, the patient appears to be doing well but near complete right side hemiparesis.  Up to very recently (07/2016) the patient was a heavy daily drinker, but has had no alcohol since June hospitalization for acute decompensation cirrhosis.  Below is th full HPI for the current admission.   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 He [facility]16 with RUE/RLE weakness and numbness. History taken from chart review and patient. [facility]. Neurology was consulted and stroke workup initiated. MRI brain performed on 8/30 reviewed, showing left medullary infarct. Per  report, acute infarct in left paramedian medullary, acute and subacute bilateral PICA territory, left subacute to chronic left medullary infarct. 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 Neurology recommended transition to DPT for 3 months and Plavix alone after stable on heparin. Patient with resulting right-sided weakness and mild dysarthria.  Cardiac echo with EF 60-65% with no wall abnormality. Hospital course complicated by leukocytosis, acute blood loss anemia. He used a rolling walker PTA. Lives with his wife who works during the day.   Current Status:  The patient appear so to have good Mental Status, and reports that he is starting to feel better mood wise.  He is not craving alcohol and ready to work on improving motor control of right side of his body.  He does report that he is "scared" that he will not improve and not sure how he will go thru life with no use of his right leg or right arm.  Behavioral Observation: Jordan KnackChristopher Thompson  presents as a 51 y.o.-year-old Right Caucasian Male who appeared his stated age. his dress was Appropriate and he was Well Groomed and his manners were Appropriate to the situation.  his participation was indicative of Appropriate and Attentive behaviors.  There were physical disabilities noted.  he displayed an appropriate level of cooperation and motivation.     Interactions:    Active Appropriate and Attentive  Attention:  within normal limits and attention span and concentration were age appropriate  Memory:   within normal limits; recent and remote memory intact  Visuo-spatial:  within normal limits  Speech (Volume):  normal  Speech:   normal; slurred  Thought Process:  Coherent and Relevant  Though  Content:  WNL;   Orientation:   person, place, time/date and situation  Judgment:   Fair  Planning:   Fair  Affect:    Anxious  Mood:    Anxious  Insight:   Good  Intelligence:   normal  Marital Status/Living: The patient is married and has supportive family.  Current Employment: Patient is not working  Substance Use:  There is a documented history of alcohol abuse confirmed by the patient and family members.  Patient reports drinking 5th of alcohol every day for years.   Medical History:   Past Medical History:  Diagnosis Date  . Cirrhosis of liver (HCC)   . CVA (cerebral vascular accident) (HCC)   . Hypercholesterolemia   . Hypertension        Psychiatric History:  Patient reports years of depression and feeling inadequate.  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  Wife earned all the money in the family.  Family Med/Psych History:  Family History  Problem Relation Age of Onset  . Hypertension Mother   . Cancer Mother   . Hypertension Father   . Parkinson's disease Father     Risk of Suicide/Violence: low patient does reports depression and anxiety but denies any current or active SI or HI.  Impression/DX:  Jordan Thompson is a 51 year old male with recent dx of alcoholic cirrhosis admitted to [facility] for decompensation 08/18/2016.  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  10/26/2016 started symptoms on right side of body and was admitted on 10/27/2016 due to RUE/RLE weakness and numbness.  Transferred to [facility] for [facility] workup.  MRI showed left medullary infarct.  Patient with resulting right side hemiparesis.  Jordan Thompson was referred for neuropsychological consultation due to coping issues.  Cognitively, the patient appears to be doing well but near complete right side hemiparesis.  Up to very recently (07/2016) the patient was a heavy daily drinker, but has had no alcohol since June  hospitalization for acute decompensation cirrhosis.  Disposition/Plan:  Will see the patient first of next week for follow-up.         Electronically Signed   _______________________ Arley Phenix, Psy.D.

## 2016-11-03 NOTE — Progress Notes (Signed)
Occupational Therapy Session Note  Patient Details  Name: Jordan KnackChristopher Kham MRN: [id number] Date of Birth: 1966/02/22  Today's Date: 11/03/2016 OT Individual Time: 1101-1203 OT Individual Time Calculation (min): 62 min    Short Term Goals: Week 1:  OT Short Term Goal 1 (Week 1): Pt will perform toilet transfer with mod A in order to decrease level of assistance with self care.  OT Short Term Goal 2 (Week 1): Pt will engaged in dynamic sitting balance task for 5 minutes with min A for balance. OT Short Term Goal 3 (Week 1): Pt will perform UB dressing with min A with modifications as needed in order to increase I in self care.  Skilled Therapeutic Interventions/Progress Updates:    Pt completed transfer to the 3:1 from the wheelchair with total assist to the right.  Max facilitation needed to support the right knee during weightbearing.  Flexed posture noted in standing as well.  He needed max assist for toilet clothing management as well.  He was unsuccessful in having a BM so completed transfer to the left back to the wheelchair with max assist.  Next proceeded to work on bathing and dressing sit to stand at the sink per pt's request.  He was able to complete UB bathing and dressing with overall min assist, and mod instructional cueing for sequencing hemidressing techniques to donn pullover shirt.  He needed max hand over hand assist for integration of the RUE to wash the left arm as well as max assist for positioning the RUE in his lap prior to transitional movements, as he was not aware of its positioning.  Max assist for sit to stand during washing of peri area and when pulling shorts over hips.  Max assist also needed for crossing RLE over left knee to initiate dressing tasks.  Pt needed max assist for donning right shoe and sock but he completed the left with min assist, except for tying.  He finished session up in wheelchair with call button and phone in reach and RUE supported by half lap  tray.    Therapy Documentation Precautions:  Precautions Precautions: Fall Restrictions Weight Bearing Restrictions: No  Pain: Pain Assessment Pain Assessment: No/denies pain ADL: See Function Navigator for Current Functional Status.   Therapy/Group: Individual Therapy  Gianni Mihalik OTR/L 11/03/2016, 12:52 PM

## 2016-11-03 NOTE — Progress Notes (Signed)
[facility] PHYSICAL MEDICINE & REHABILITATION     PROGRESS NOTE  Subjective/Complaints:  Pt seen sitting up in bed this AM.  He states he slept fairly overnight because he slept too much during the day yesterday.  He notes a good first days with therapies, but wants to be able to move his RUE.    ROS: Denies CP, SOB, N/V/D.  Objective: Vital Signs: Blood pressure (!) 142/79, pulse 78, temperature 98 F (36.7 C), temperature source Oral, resp. rate 17, height 5\' 10"  (1.778 m), weight 95.3 kg (210 lb 3.2 oz), SpO2 93 %. No results found.  Recent Labs  11/01/16 0334 11/02/16 1101  WBC 11.3* 11.3*  HGB 11.5* 12.1*  HCT 36.5* 39.5  PLT 245 262    Recent Labs  11/01/16 0334 11/02/16 1101  NA 137 135  K 3.6 4.0  CL 104 102  GLUCOSE 107* 126*  BUN 7 7  CREATININE 0.80 0.74  CALCIUM 9.1 9.6   CBG (last 3)  No results for input(s): GLUCAP in the last 72 hours.  Wt Readings from Last 3 Encounters:  11/02/16 95.3 kg (210 lb 3.2 oz)  10/28/16 97.7 kg (215 lb 6.2 oz)    Physical Exam:  BP (!) 142/79 (BP Location: Left Arm)   Pulse 78   Temp 98 F (36.7 C) (Oral)   Resp 17   Ht 5\' 10"  (1.778 m)   Wt 95.3 kg (210 lb 3.2 oz)   SpO2 93%   BMI 30.16 kg/m  Constitutional: He appears well-developed and well-nourished. No distress.  HENT: Normocephalic and atraumatic.  Eyes: EOM are normal. No discharge.  Cardiovascular: RRR.  No JVD Respiratory: Effort normal and breath sounds normal. GI: Bowel sounds are normal. He exhibits no distension. Musculoskeletal: He exhibits no edema or tenderness.  Neurological: He is alert and oriented.  Dysphonia.  Right facial weakness.  Verbose.   RUE/RLE: 0/5 proximal to distal (unchanged) No increase in tone LUE/LLE: 5/5 proximal to distal Skin: Skin is warm and dry. He is not diaphoretic.  Psychiatric: He has a normal mood and affect. His behavior is normal. Thought content normal.   Assessment/Plan: 1. Functional deficits  secondary to left medullary infarct, bilateral cerebellar infarcts which require 3+ hours per day of interdisciplinary therapy in a comprehensive inpatient rehab setting. Physiatrist is providing close team supervision and 24 hour management of active medical problems listed below. Physiatrist and rehab team continue to assess barriers to discharge/monitor patient progress toward functional and medical goals.  Function:  Bathing Bathing position   Position: Shower  Bathing parts Body parts bathed by patient: Right arm, Chest, Abdomen, Front perineal area, Right upper leg, Left upper leg Body parts bathed by helper: Buttocks, Left arm, Right lower leg, Left lower leg, Back  Bathing assist Assist Level:  (mod A)      Upper Body Dressing/Undressing Upper body dressing   What is the patient wearing?: Pull over shirt/dress     Pull over shirt/dress - Perfomed by patient: Thread/unthread left sleeve, Put head through opening Pull over shirt/dress - Perfomed by helper: Thread/unthread right sleeve, Pull shirt over trunk        Upper body assist Assist Level:  (mod a)      Lower Body Dressing/Undressing Lower body dressing   What is the patient wearing?: Pants, Non-skid slipper socks       Pants- Performed by helper: Thread/unthread right pants leg, Thread/unthread left pants leg, Pull pants up/down   Non-skid slipper socks-  Performed by helper: Don/doff right sock, Don/doff left sock                  Lower body assist Assist for lower body dressing:  (total A)      Toileting Toileting   Toileting steps completed by patient: Adjust clothing prior to toileting, Adjust clothing after toileting Toileting steps completed by helper: Performs perineal hygiene Toileting Assistive Devices: Toilet aid  Toileting assist Assist level: More than reasonable time, Set up/obtain supplies   Transfers Chair/bed transfer Chair/bed transfer activity did not occur: N/A   Chair/bed  transfer assist level: dependent (Pt equals 0%) Chair/bed transfer assistive device: Mechanical lift Mechanical lift: Stedy   Locomotion Ambulation Ambulation activity did not occur: Safety/medical concerns (R hemiplegia w/ 0/5 MMT globally)         Wheelchair   Type: Manual Max wheelchair distance: 150' Assist Level: Supervision or verbal cues  Cognition Comprehension Comprehension assist level: Follows complex conversation/direction with extra time/assistive device  Expression Expression assist level: Expresses complex 90% of the time/cues < 10% of the time  Social Interaction Social Interaction assist level: Interacts appropriately with others with medication or extra time (anti-anxiety, antidepressant).  Problem Solving Problem solving assist level: Solves complex problems: With extra time  Memory Memory assist level: More than reasonable amount of time    Medical Problem List and Plan: 1.  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   Cont CIR  WHO/PRAFO  2.  DVT Prophylaxis/Anticoagulation: Pharmaceutical: Lovenox--monitor platelets for stability.  3. HA/Pain Management: tylenol prn for HA.  4. Mood: LCSW to follow for evaluation and support. Team to provide ego support.  5. Neuropsych: This patient is capable of making decisions on his own behalf. 6. Skin/Wound Care: routine pressure relief measures.  7. Fluids/Electrolytes/Nutrition: Monitor I/Os  BMP within acceptable range on 9/4 8. HTN: Monitor BP bid--continue lasix and aldactone.   Relatively controlled, ?trending up  Cont to monitor 9. Cirrhosis of liver: [facility]. New diagnosis post decompensation. Resumed low salt restrictions with lasix and aldactone.   Decreased lactulose to bid and check ammonia levels next week (recent level 28) and frequent loose stools reported.   Will continue to monitor for encephalopathy.  Monitor weights  daily. 2 gram tylenol max/day 9. Leucocytosis: Monitor for signs of infection--likely reactive.   WBCs 11.3 on 9/4  Afebriel  Cont to monitor 10.  Prediabetes: Hgb A1c- 6.1.  Added CM restrictions. Consulted dietitian to reinforce education.   Relatively controlled 9/5 11. ABLA  Hb 12.1 on 9/4  Cont to montior  LOS (Days) 2 A FACE TO FACE EVALUATION WAS PERFORMED  Albino Bufford Karis Jubanil Stelios Kirby 11/03/2016 7:48 AM

## 2016-11-04 ENCOUNTER — Inpatient Hospital Stay (HOSPITAL_COMMUNITY): Payer: Self-pay | Admitting: Physical Therapy

## 2016-11-04 ENCOUNTER — Inpatient Hospital Stay (HOSPITAL_COMMUNITY): Payer: Medicaid Other | Admitting: Occupational Therapy

## 2016-11-04 ENCOUNTER — Inpatient Hospital Stay (HOSPITAL_COMMUNITY): Payer: Self-pay | Admitting: Occupational Therapy

## 2016-11-04 DIAGNOSIS — G8111 Spastic hemiplegia affecting right dominant side: Secondary | ICD-10-CM | POA: Insufficient documentation

## 2016-11-04 DIAGNOSIS — I69331 Monoplegia of upper limb following cerebral infarction affecting right dominant side: Secondary | ICD-10-CM

## 2016-11-04 NOTE — Progress Notes (Signed)
Physical Therapy Session Note  Patient Details  Name: Jordan Thompson MRN: [id number] Date of Birth: 12/24/1965  Today's Date: 11/04/2016 PT Individual Time: 0710-0810 AND 1300-1330 PT Individual Time Calculation (min): 60 min AND 30 min   Short Term Goals: Week 1:  PT Short Term Goal 1 (Week 1): Pt will perform sit<>supine w/ supervision PT Short Term Goal 2 (Week 1): Pt will transfer bed<>chair via squat or stand pivot w/ Max A PT Short Term Goal 3 (Week 1): Pt will ambulated 5' w/ Max A using LRAD PT Short Term Goal 4 (Week 1): Pt will tolerate 15 min of upright activity w/o c/o dizziness and w/ stable vital signs  Skilled Therapeutic Interventions/Progress Updates:   Session 1:  Pt supine in bed upon arrival and agreeable to therapy, no c/o pain. Pt transferred to EOB w/ Min A and transferred to w/c via lateral scoot and Min A as well. Pt practiced w/c mobility to therapy gym w/ supervision utilizing LUE/LEs for propulsion.   Worked on sit<>stands w/ Max A to block R knee, Min A to stand at rail in hallway. 2 stands, 30-60 sec each, w/ verbal and tactile cues for R quad activation. Pre-gait activities w/ lateral weight shifts and L forward/backward stepping. Mirror for visual feedback for posture. Seated rest secondary to fatigue/dizziness, no signs of symptoms of orthostasis.   NuStep @ L3, 10 min, 5 min, to increase endurance, LE strength, and reciprocal gait pattern. Ended session supine in bed, call bell within reach and all needs met.   Session 2: Pt in w/c upon arrival and agreeable to therapy, no c/o pain. Worked on trunk control and dynamic sitting balance at EOB. Tasks reaching outside of BOS to both left and right side utilizing LUE. Verbal cues to not rely on UE support to remain seated upright and visual cues for trunk activation to remain upright. Ended session supine, call bell within reach and all needs met.   Therapy Documentation Precautions:   Precautions Precautions: Fall Precaution Comments: left hemiparesis Restrictions Weight Bearing Restrictions: No Pain: Pain Assessment Pain Assessment: No/denies pain  See Function Navigator for Current Functional Status.   Therapy/Group: Individual Therapy  Jaylanie Boschee K Arnette 11/04/2016, 1:33 PM

## 2016-11-04 NOTE — Progress Notes (Signed)
[place] PHYSICAL MEDICINE & REHABILITATION     PROGRESS NOTE  Subjective/Complaints:  Pt seen sitting up in her chair working with therapies.  He slept well overnight.  He notes improvement in his RLE.   ROS: Denies CP, SOB, N/V/D.  Objective: Vital Signs: Blood pressure 136/80, pulse 87, temperature 98 F (36.7 C), temperature source Oral, resp. rate 18, height 5\' 10"  (1.778 m), weight 95.3 kg (210 lb 3.2 oz), SpO2 97 %. No results found.  Recent Labs  11/02/16 1101  WBC 11.3*  HGB 12.1*  HCT 39.5  PLT 262    Recent Labs  11/02/16 1101  NA 135  K 4.0  CL 102  GLUCOSE 126*  BUN 7  CREATININE 0.74  CALCIUM 9.6   CBG (last 3)  No results for input(s): GLUCAP in the last 72 hours.  Wt Readings from Last 3 Encounters:  11/02/16 95.3 kg (210 lb 3.2 oz)  10/28/16 97.7 kg (215 lb 6.2 oz)    Physical Exam:  BP 136/80 (BP Location: Left Arm)   Pulse 87   Temp 98 F (36.7 C) (Oral)   Resp 18   Ht 5\' 10"  (1.778 m)   Wt 95.3 kg (210 lb 3.2 oz)   SpO2 97%   BMI 30.16 kg/m  Constitutional: He appears well-developed and well-nourished. No distress.  HENT: Normocephalic and atraumatic.  Eyes: EOM are normal. No discharge.  Cardiovascular: RRR.  No JVD Respiratory: Effort normal and breath sounds normal. GI: Bowel sounds are normal. He exhibits no distension. Musculoskeletal: He exhibits no edema or tenderness.  Neurological: He is alert and oriented.  Dysphonia.  Right facial weakness.  Verbose.   RUE: 0/5 proximal to distal (unchanged) RLE: 2/5 HF, KE, 0/5 ADF/PF LUE/LLE: 5/5 proximal to distal Skin: Skin is warm and dry. He is not diaphoretic.  Psychiatric: He has a normal mood and affect. His behavior is normal. Thought content normal.   Assessment/Plan: 1. 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 Physiatrist  is providing close team supervision and 24 hour management of active medical problems listed below. Physiatrist and rehab team continue to assess barriers to discharge/monitor patient progress toward functional and medical goals.  Function:  Bathing Bathing position   Position: Wheelchair/chair at sink  Bathing parts Body parts bathed by patient: Right arm, Chest, Abdomen, Right upper leg, Left upper leg Body parts bathed by helper: Right lower leg, Left lower leg, Buttocks, Front perineal area, Left arm, Right arm, Back  Bathing assist Assist Level:  (mod A)      Upper Body Dressing/Undressing Upper body dressing   What is the patient wearing?: Pull over shirt/dress     Pull over shirt/dress - Perfomed by patient: Thread/unthread left sleeve, Put head through opening, Pull shirt over trunk Pull over shirt/dress - Perfomed by helper: Thread/unthread right sleeve        Upper body assist Assist Level:  (mod a)      Lower Body Dressing/Undressing Lower body dressing   What is the patient wearing?: Pants, Socks, Shoes     Pants- Performed by patient: Thread/unthread left pants leg Pants- Performed by helper: Thread/unthread right pants leg, Pull pants up/down   Non-skid slipper socks- Performed by helper: Don/doff right sock, Don/doff left sock Socks - Performed by patient: Don/doff left sock Socks - Performed by helper: Don/doff right sock Shoes - Performed by patient: Don/doff  left shoe Shoes - Performed by helper: Don/doff right shoe, Fasten right, Fasten left          Lower body assist Assist for lower body dressing:  (total A)      Toileting Toileting   Toileting steps completed by patient: Adjust clothing prior to toileting, Adjust clothing after toileting Toileting steps completed by helper: Adjust clothing prior to toileting, Performs perineal hygiene, Adjust clothing after toileting Toileting Assistive Devices: Toilet aid  Toileting assist Assist level: More  than reasonable time, Set up/obtain supplies   Transfers Chair/bed transfer Chair/bed transfer activity did not occur: N/A Chair/bed transfer method: Lateral scoot Chair/bed transfer assist level: Touching or steadying assistance (Pt > 75%) Chair/bed transfer assistive device: Armrests Mechanical lift: Stedy   Locomotion Ambulation Ambulation activity did not occur: Safety/medical concerns (R hemiplegia w/ 0/5 MMT globally)         Wheelchair   Type: Manual Max wheelchair distance: 100 ft Assist Level: Supervision or verbal cues  Cognition Comprehension Comprehension assist level: Follows complex conversation/direction with extra time/assistive device  Expression Expression assist level: Expresses complex ideas: With extra time/assistive device  Social Interaction Social Interaction assist level: Interacts appropriately with others with medication or extra time (anti-anxiety, antidepressant).  Problem Solving Problem solving assist level: Solves basic problems with no assist  Memory Memory assist level: Recognizes or recalls 90% of the time/requires cueing < 10% of the time    Medical Problem List and Plan: 1.  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   Cont CIR  WHO/PRAFO  2.  DVT Prophylaxis/Anticoagulation: Pharmaceutical: Lovenox--monitor platelets for stability.  3. HA/Pain Management: tylenol prn for HA.  4. Mood: LCSW to follow for evaluation and support. Team to provide ego support.  5. Neuropsych: This patient is capable of making decisions on his own behalf. 6. Skin/Wound Care: routine pressure relief measures.  7. Fluids/Electrolytes/Nutrition: Monitor I/Os  BMP within acceptable range on 9/4 8. HTN: Monitor BP bid--continue lasix and aldactone.   Controlled 9/6  Cont to monitor 9. Cirrhosis of liver: has been set up with GI at [facility]. New diagnosis post decompensation. Resumed low salt restrictions with  lasix and aldactone.   Decreased lactulose to bid and check ammonia levels next week (recent level 28).   Will continue to monitor for encephalopathy.  Monitor weights daily. 2 gram tylenol max/day 9. Leucocytosis: Monitor for signs of infection--likely reactive.   WBCs 11.3 on 9/4  Afebriel  Cont to monitor 10.  Prediabetes: Hgb A1c- 6.1.  Added CM restrictions. Consulted dietitian to reinforce education.   Relatively controlled 9/5 11. ABLA  Hb 12.1 on 9/4  Cont to montior  LOS (Days) 3 A FACE TO FACE EVALUATION WAS PERFORMED  Xzavior Reinig Karis Juba 11/04/2016 8:13 AM

## 2016-11-04 NOTE — Patient Care Conference (Signed)
Inpatient RehabilitationTeam Conference and Plan of Care Update Date: 11/03/2016   Time: 2:20 PM    Patient Name: Jordan Thompson      Medical Record Number: [id number]  Date of Birth: [birth year] Sex: Male         Room/Bed: 4M13C/4M13C-01 Payor Info: Payor: MEDICAID POTENTIAL / Plan: MEDICAID POTENTIAL / Product Type: *No Product type* /    Admitting Diagnosis: rehab  Admit Date/Time:  11/01/2016  4:01 PM Admission Comments: No comment available   Primary Diagnosis:  <principal problem not specified> Principal Problem: <principal problem not specified>  Patient Active Problem List   Diagnosis Date Noted  . Right hemiparesis (HCC)   . 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 Adjustment disorder with mixed anxiety and depressed mood   . 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 Right hemiplegia (HCC)   . Vascular headache   . Hypokalemia   . Alcoholic cirrhosis (HCC)   . GERD without esophagitis   . Leukocytosis   . Acute blood loss anemia   . Benign essential HTN   . Hyperlipidemia   . History of CVA (cerebrovascular accident)   . Prediabetes   . Stroke (cerebrum) [facility](HCC) - acute right cerebellar PICA territory and left paramedian medulla infarcts 10/28/2016    Expected Discharge Date: Expected Discharge Date: 11/23/16  Team Members Present: Physician leading conference: Dr. 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  needs assistance positioning urinal to avoid spilling.  remain continent of b/b with min assist.  monitor b/b function q shift and prn   Swallow/Nutrition/ Hydration             ADL's   max A sit <>stand, max A - total A functional transfers and use of STEDY, UB self care mod A, LB self care total A  S/set up for grooming and UB self care, mod A LB self care, min A overall  R NMR, ADL retraining, balance, functional transfers, pt education, safety awareness   Mobility   Total A stedy tranfers, Min A sliding board transfers and sit<>stand, supervision w/c mobility, have no initiated gait  Mod A gait, supervision transfers and w/c mobility   initiate gait, RLE NMR and ms activation, transfers    Communication   hoarse vocal quality, will need ENT follow up as an outpatient   mod I   education and carryover of vocal hygiene techniques   Safety/Cognition/ Behavioral Observations            Pain   occasional c/c headache relieved by tylenol.no prns last 2 shifts.  pain less than or equal to 2.  assess pain q shift and prn, administer medication as needed.   Skin   skin is not an issue  no skin breakdown while on IPR  assess skin q shift and prn      *See Care Plan and progress notes for long and short-term goals.     Barriers to Discharge  Current Status/Progress Possible Resolutions  Date Resolved   Physician    Decreased caregiver support;Medical stability;Lack of/limited family support     See above  Therapies, follow labs, follow BP      Nursing                  PT  Inaccessible home environment;Decreased caregiver support;Home environment access/layout;Lack of/limited family support  Unsure if wife or caregiver can physically assist if necessary, but plans to have 24/7 supervision at least, R hemiplegia               OT Decreased caregiver support                SLP                SW Decreased caregiver support;Insurance for SNF coverage Unsure if mother in-law can provide the  physical levle of assist pt will require. pt is Medicaid pending-uninsured            Discharge Planning/Teaching Needs:  Home with wife who works and mother in-law who can be there while wife working-question if can provide mod assist level though      Team Discussion:  Goals min-mod level of assist. Flaccid right side. BP trending up-MD watching and adjusting meds. Speech recommends ENT follow up as OP. Goals wheelchair level.  Revisions to Treatment Plan:  DC 9/25    Continued Need for Acute Rehabilitation Level of Care: The patient requires daily medical management by a physician with specialized training in physical medicine and rehabilitation for the following conditions: Daily direction of a multidisciplinary physical rehabilitation program to ensure safe treatment while eliciting the highest outcome that is of practical value to the patient.: Yes Daily medical management of patient stability for increased activity during participation in an intensive rehabilitation regime.: Yes Daily analysis of laboratory values and/or radiology reports with any subsequent need for medication adjustment of medical intervention for : Neurological problems;Blood pressure problems;Other  Ragena Fiola, Lemar Livings 11/04/2016, 8:45 AM

## 2016-11-04 NOTE — Progress Notes (Signed)
Social Work Patient ID: Jordan Thompson, male   DOB: 28-Oct-1965, 51 y.o.   MRN: [id number]  Met with pt to discuss team conference goals min assist level and target discharge 9/25. He was excited today due To getting movement in his right leg, he is hoping each ay it will get better. He does not want to be too much care for his mother in-law. Will work on discharge and hopes he continues to improve here.

## 2016-11-04 NOTE — Progress Notes (Signed)
Occupational Therapy Session Note  Patient Details  Name: Jordan Thompson MRN: [id number] Date of Birth: [birth year]  Today's Date: 11/04/2016 OT Individual Time: 2707-8675 OT Individual Time Calculation (min): 45 min   Short Term Goals: Week 1:  OT Short Term Goal 1 (Week 1): Pt will perform toilet transfer with mod A in order to decrease level of assistance with self care.  OT Short Term Goal 2 (Week 1): Pt will engaged in dynamic sitting balance task for 5 minutes with min A for balance. OT Short Term Goal 3 (Week 1): Pt will perform UB dressing with min A with modifications as needed in order to increase I in self care.  Skilled Therapeutic Interventions/Progress Updates:    OT treatment session focused on LB dressing, R NMR, NMES, and standing endurance. Educated pt on figure 4 position semi-reclined in bed to don shoes. Pt needed set up A for R LE positioning, then able to don shoe, but needed assist for fastening. Pt transferred bed>wc on stronger L side with mod A. Sit<>stand at the sink with R LE blocked while OT adjusted pants.1:1 NMES applied to wrist extensors.  Ratio 1:1 Rate 35 pps Waveform- Asymmetric Ramp 1.0 Pulse 300 Intensity- 26 Duration - 13  No adverse reactions after treatment and is skin intact.   Pt stood in standing frame for 8 mins x 2. Incorporated R NMR with weight bearing towel pushes. Pt with increased shoulder activation with repetition. Pt returned to room at end of session and transferred back to bed in similar fashion as above. Pt left semi-reclined in bed with needs met.   Therapy Documentation Precautions:  Precautions Precautions: Fall Precaution Comments: left hemiparesis Restrictions Weight Bearing Restrictions: No Pain: Pain Assessment Pain Assessment: No/denies pain  See Function Navigator for Current Functional Status.   Therapy/Group: Individual Therapy  Valma Cava 11/04/2016, 3:11 PM

## 2016-11-04 NOTE — Progress Notes (Signed)
Occupational Therapy Session Note  Patient Details  Name: Jordan KnackChristopher Thompson MRN: [id number] Date of Birth: [birth year]  Today's Date: 11/04/2016 OT Individual Time: 1100-1203 OT Individual Time Calculation (min): 63 min    Short Term Goals: Week 1:  OT Short Term Goal 1 (Week 1): Pt will perform toilet transfer with mod A in order to decrease level of assistance with self care.  OT Short Term Goal 2 (Week 1): Pt will engaged in dynamic sitting balance task for 5 minutes with min A for balance. OT Short Term Goal 3 (Week 1): Pt will perform UB dressing with min A with modifications as needed in order to increase I in self care.  Skilled Therapeutic Interventions/Progress Updates:    Pt completed bathing and dressing during session.  Mod assist for supine to sit EOB and for squat pivot transfer to the wheelchair.  Once in the chair he completed stand pivot transfer to the tub bench with total assist.  Bathing completed sit to stand with max assist.  He was able to wash all UB with supervision except for the right arm, which therapist was able to assist hand over hand using the RUE.  Max assist for washing peri area sit to stand with max assist on the left knee for stabilization.  Transferred back to the wheelchair with total assist stand pivot.  Dressing completed from wheelchair with max assist for crossing the RLE over the left knee for starting shorts, and donning socks.  Max assist for sit to stand and static standing balance while attempting to pull pants over hips.  Therapist assisted with donning shoes secondary to time.  Pt left in chair with call button in reach and working on eating lunch.    Therapy Documentation Precautions:  Precautions Precautions: Fall Precaution Comments: left hemiparesis Restrictions Weight Bearing Restrictions: No  Pain: Pain Assessment Pain Assessment: No/denies pain ADL: See Function Navigator for Current Functional Status.   Therapy/Group:  Individual Therapy  Jocelin Schuelke OTR/L 11/04/2016, 12:15 PM

## 2016-11-05 ENCOUNTER — Inpatient Hospital Stay (HOSPITAL_COMMUNITY): Payer: Medicaid Other | Admitting: Occupational Therapy

## 2016-11-05 ENCOUNTER — Inpatient Hospital Stay (HOSPITAL_COMMUNITY): Payer: Self-pay | Admitting: Physical Therapy

## 2016-11-05 LAB — CBC
HCT: 38.3 % — ABNORMAL LOW (ref 39.0–52.0)
Hemoglobin: 11.8 g/dL — ABNORMAL LOW (ref 13.0–17.0)
MCH: 26.3 pg (ref 26.0–34.0)
MCHC: 30.8 g/dL (ref 30.0–36.0)
MCV: 85.5 fL (ref 78.0–100.0)
PLATELETS: 241 10*3/uL (ref 150–400)
RBC: 4.48 MIL/uL (ref 4.22–5.81)
RDW: 19 % — AB (ref 11.5–15.5)
WBC: 11.1 10*3/uL — AB (ref 4.0–10.5)

## 2016-11-05 LAB — BASIC METABOLIC PANEL
Anion gap: 13 (ref 5–15)
BUN: 11 mg/dL (ref 6–20)
CHLORIDE: 100 mmol/L — AB (ref 101–111)
CO2: 22 mmol/L (ref 22–32)
CREATININE: 0.68 mg/dL (ref 0.61–1.24)
Calcium: 9.4 mg/dL (ref 8.9–10.3)
GFR calc Af Amer: >60 mL/min (ref >60)
GFR calc non Af Amer: >60 mL/min (ref >60)
GLUCOSE: 113 mg/dL — AB (ref 65–99)
Potassium: 4.1 mmol/L (ref 3.5–5.1)
SODIUM: 135 mmol/L (ref 135–145)

## 2016-11-05 LAB — AMMONIA: Ammonia: 81 umol/L — ABNORMAL HIGH (ref 9–35)

## 2016-11-05 NOTE — Progress Notes (Signed)
Occupational Therapy Session Note  Patient Details  Name: Jordan Thompson MRN: [id number] Date of Birth: November 27, 1965  Today's Date: 11/05/2016 OT Individual Time: 4098-11911103-1201 OT Individual Time Calculation (min): 58 min    Short Term Goals: Week 1:  OT Short Term Goal 1 (Week 1): Pt will perform toilet transfer with mod A in order to decrease level of assistance with self care.  OT Short Term Goal 2 (Week 1): Pt will engaged in dynamic sitting balance task for 5 minutes with min A for balance. OT Short Term Goal 3 (Week 1): Pt will perform UB dressing with min A with modifications as needed in order to increase I in self care.  Skilled Therapeutic Interventions/Progress Updates:    Pt completed bathing and dressing during session at the sink per his choice.  He needed mod assist for squat pivot transfer from the bed to the wheelchair to the left side.  Once over to the sink he was able to complete UB bathing with min assist, needing only max hand over hand to integrate the RUE into washing the left arm.  He was able to [employer] pullover shirt with setup this session as well.  Mod assist for sit to stand during LB dressing and bathing, with mod assist to also maintain standing.  Increased lean to the right with right knee requiring max facilitation to keep from buckling.  Positioned the RUE on the sink as well for increased weightbearing.  Pt needed min assist for crossing the RLE over the left knee and maintaining to donn all LB clothing.  Will attempt to adapt shoes as pt cannot currently tie them.  Finished session with pt up in wheelchair at bedside with call button and phone in reach.    Therapy Documentation Precautions:  Precautions Precautions: Fall Precaution Comments: left hemiparesis Restrictions Weight Bearing Restrictions: No  Pain: Pain Assessment Pain Assessment: No/denies pain ADL: See Function Navigator for Current Functional Status.   Therapy/Group: Individual  Therapy  Taje Tondreau OTR/L 11/05/2016, 12:32 PM

## 2016-11-05 NOTE — Progress Notes (Signed)
Occupational Therapy Session Note  Patient Details  Name: Jordan Thompson MRN: [id number] Date of Birth: 22-Aug-1965  Today's Date: 11/05/2016 OT Individual Time: 1191-47821103-1201 OT Individual Time Calculation (min): 58 min    Short Term Goals: Week 1:  OT Short Term Goal 1 (Week 1): Pt will perform toilet transfer with mod A in order to decrease level of assistance with self care.  OT Short Term Goal 2 (Week 1): Pt will engaged in dynamic sitting balance task for 5 minutes with min A for balance. OT Short Term Goal 3 (Week 1): Pt will perform UB dressing with min A with modifications as needed in order to increase I in self care.  Skilled Therapeutic Interventions/Progress Updates:    Pt worked on Chief of Staffneuromuscular re-education in the therapy gym during session.  Max assist for stand pivot transfer from the wheelchair to the EOM to start treatment.  Pt with increased right trunk elongation in static sitting and left side shortening.  Encouraged anterior pelvic tilt in sitting with weight shifted forward over ischial tuberosities.  Worked on RUE in weightbearing while reaching to the right and forward/right.  Min assist for pt to regain sitting balance while reaching across with the left.  Therapist providing min to mod demonstrational cueing to the right elbow extensors and right trunk.  Pt able to initiate some active shoulder movement on the right when attempting to maintain balance and when returning to midline.   Mod assist for squat pivot transfer back to wheelchair at end of session.  Educated pt on self AAROM exercises for the right shoulder, elbow,, and digits.  He was able to return demonstrate them as well.  Returned to room at end of session with pt still left up in chair with call button and phone in reach.  Applied half lap tray for supporting of the RUE.     Therapy Documentation Precautions:  Precautions Precautions: Fall Precaution Comments: left hemiparesis Restrictions Weight  Bearing Restrictions: No  Pain: Pain Assessment Pain Assessment: No/denies pain ADL: See Function Navigator for Current Functional Status.   Therapy/Group: Individual Therapy  Marisue Canion OTR/L 11/05/2016, 3:59 PM

## 2016-11-05 NOTE — Progress Notes (Signed)
[facility]     PROGRESS NOTE  Subjective/Complaints:  Pt seen sitting up in bed this AM.  He slept well overnight.  He and nurse note increase bleeding from injection site.   ROS: Denies CP, SOB, N/V/D.  Objective: Vital Signs: Blood pressure 132/89, pulse 85, temperature 98.4 F (36.9 C), temperature source Oral, resp. rate 18, height  (1.778 m), weight 96.3 kg (212 lb 3.2 oz), SpO2 98 %. No results found.  Recent Labs  11/02/16 1101 11/05/16 0534  WBC 11.3* 11.1*  HGB 12.1* 11.8*  HCT 39.5 38.3*  PLT 262 241    Recent Labs  11/02/16 1101 11/05/16 0534  NA 135 135  K 4.0 4.1  CL 102 100*  GLUCOSE 126* 113*  BUN 7 11  CREATININE 0.74 0.68  CALCIUM 9.6 9.4   CBG (last 3)  No results for input(s): GLUCAP in the last 72 hours.  Wt Readings from Last 3 Encounters:  11/05/16 96.3 kg (212 lb 3.2 oz)  10/28/16 97.7 kg (215 lb 6.2 oz)    Physical Exam:  BP 132/89 (BP Location: Left Arm)   Pulse 85   Temp 98.4 F (36.9 C) (Oral)   Resp 18   Ht  (1.778 m)   Wt 96.3 kg (212 lb 3.2 oz)   SpO2 98%   BMI 30.45 kg/m  Constitutional: He appears well-developed and well-nourished. No distress.  HENT: Normocephalic and atraumatic.  Eyes: EOM are normal. No discharge.  Cardiovascular: RRR.  No JVD Respiratory: Effort normal and breath sounds normal. GI: Bowel sounds are normal. He exhibits no distension. Musculoskeletal: He exhibits no edema or tenderness.  Neurological: He is alert and oriented.  Dysphonia.  Right facial weakness.  Verbose.   RUE: 0/5 proximal to distal (stable) RLE: 2/5 HF, KE, 0/5 ADF/PF (improving) LUE/LLE: 5/5 proximal to distal Skin: Skin is warm and dry. He is not diaphoretic.  Psychiatric: He has a normal mood and affect. His behavior is normal. Thought content normal.   Assessment/Plan: 1. 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 Physiatrist is providing close team supervision and 24 hour management of active medical problems listed below. Physiatrist and rehab team continue to assess barriers to discharge/monitor patient progress toward functional and medical goals.  Function:  Bathing Bathing position   Position: Wheelchair/chair at sink  Bathing parts Body parts bathed by patient: Right arm, Chest, Abdomen, Right upper leg, Left upper leg Body parts bathed by helper: Right lower leg, Left lower leg, Buttocks, Front perineal area, Left arm, Right arm, Back  Bathing assist Assist Level:  (mod A)      Upper Body Dressing/Undressing Upper body dressing   What is the patient wearing?: Pull over shirt/dress     Pull over shirt/dress - Perfomed by patient: Thread/unthread left sleeve, Put head through opening, Pull shirt over trunk, Thread/unthread right sleeve Pull over shirt/dress - Perfomed by helper: Thread/unthread right sleeve        Upper body assist Assist Level: Supervision or verbal cues      Lower Body Dressing/Undressing Lower body dressing   What is the patient wearing?: Pants, Socks, Shoes     Pants- Performed by patient: Thread/unthread left pants leg Pants- Performed by helper: Thread/unthread right pants leg, Pull pants up/down   Non-skid slipper socks- Performed by helper: Don/doff right sock, Don/doff left sock Socks -  Performed by patient: Don/doff left sock Socks - Performed by helper: Don/doff right sock, Don/doff left sock Shoes - Performed by patient: Don/doff left shoe Shoes - Performed by helper: Don/doff right shoe, Fasten right, Fasten left          Lower body assist Assist for lower body dressing:  (total A)      Toileting Toileting   Toileting steps completed by patient: Adjust clothing prior to toileting, Adjust clothing after toileting Toileting steps completed by helper: Adjust  clothing prior to toileting, Performs perineal hygiene, Adjust clothing after toileting Toileting Assistive Devices: Toilet aid  Toileting assist Assist level: More than reasonable time, Set up/obtain supplies   Transfers Chair/bed transfer Chair/bed transfer activity did not occur: N/A Chair/bed transfer method: Squat pivot Chair/bed transfer assist level: Moderate assist (Pt 50 - 74%/lift or lower) Chair/bed transfer assistive device: Armrests Mechanical lift: Stedy   Locomotion Ambulation Ambulation activity did not occur: Safety/medical concerns (R hemiplegia w/ 0/5 MMT globally)         Wheelchair   Type: Manual Max wheelchair distance: 100 ft Assist Level: Supervision or verbal cues  Cognition Comprehension Comprehension assist level: Follows complex conversation/direction with extra time/assistive device  Expression Expression assist level: Expresses complex ideas: With extra time/assistive device  Social Interaction Social Interaction assist level: Interacts appropriately with others with medication or extra time (anti-anxiety, antidepressant).  Problem Solving Problem solving assist level: Solves basic problems with no assist  Memory Memory assist level: Recognizes or recalls 90% of the time/requires cueing < 10% of the time    Medical Problem List and Plan: 1.  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   Cont CIR  WHO/PRAFO  2.  DVT Prophylaxis/Anticoagulation: Pharmaceutical: Lovenox- may need to d/c if bleeding persists 3. HA/Pain Management: tylenol prn for HA.  4. Mood: LCSW to follow for evaluation and support. Team to provide ego support.  5. Neuropsych: This patient is capable of making decisions on his own behalf. 6. Skin/Wound Care: routine pressure relief measures.  7. Fluids/Electrolytes/Nutrition: Monitor I/Os  BMP within acceptable range on 9/7 8. HTN: Monitor BP bid--continue lasix and aldactone.    Controlled 9/7  Cont to monitor 9. Cirrhosis of liver: [facility] New diagnosis post decompensation. Resumed low salt restrictions with lasix and aldactone.   Decreased lactulose to bid and check ammonia levels next week (recent level 28).   Will continue to monitor for encephalopathy.  Monitor weights daily. 2 gram tylenol max/day 9. Leucocytosis: Monitor for signs of infection--likely reactive.   WBCs 11.1 on 9/7  Afebrile  Cont to monitor 10.  Prediabetes: Hgb A1c- 6.1.  Added CM restrictions. Consulted dietitian to reinforce education.   Relatively controlled 9/7 11. ABLA  Hb 11.8 on 9/7  Cont to montior  LOS (Days) 4 A FACE TO FACE EVALUATION WAS PERFORMED  Lavonda Thal Karis Jubanil Dametra Whetsel 11/05/2016 8:20 AM

## 2016-11-05 NOTE — Progress Notes (Signed)
Physical Therapy Session Note  Patient Details  Name: Jordan Thompson MRN: [id number] Date of Birth: 08/02/1965  Today's Date: 11/05/2016 PT Individual Time: 0800-0915 PT Individual Time Calculation (min): 75 min   Short Term Goals: Week 1:  PT Short Term Goal 1 (Week 1): Pt will perform sit<>supine w/ supervision PT Short Term Goal 2 (Week 1): Pt will transfer bed<>chair via squat or stand pivot w/ Max A PT Short Term Goal 3 (Week 1): Pt will ambulated 5' w/ Max A using LRAD PT Short Term Goal 4 (Week 1): Pt will tolerate 15 min of upright activity w/o c/o dizziness and w/ stable vital signs  Skilled Therapeutic Interventions/Progress Updates:   Pt supine in bed upon arrival and agreeable to therapy, no c/o pain.  Worked on functional activity and NMR while in stance to facilitate RLE weight-bearing. Performed sit<>stand at rail in hallway w/ Min guard, Max A to manually block R knee. Standing for 60 sec bouts w/ LUE support on rail in hallway. Visual cues for posture and symmetry w/ mirror. Seated rest breaks secondary to fatigue and c/o dizziness that resolves w/ rest.   NMES to R quad to work on quad activation and NMR, 10 mins attended. E-stim parameters as follows. Pt tolerated treatment well and no evidence of skin breakdown.  -400 pps  -asymmetrical waveform -10 sec on, 10 sec off, 5 sec ramp  -28 Hz  NuStep @ L3, 12 min, to work on reciprocal movement pattern and LE strength. No UE assist required this session, improvement from yesterday.   Ended session supine in recliner, call bell within reach and all needs met. Educated pt on importance of spending a portion of the day sitting upright and OOB. Pt in agreement to stay in recliner until 10am and call RN at that time for assistance back to bed. RN made aware.   Therapy Documentation Precautions:  Precautions Precautions: Fall Precaution Comments: left hemiparesis Restrictions Weight Bearing Restrictions: No Vital  Signs: Therapy Vitals Temp: 98.4 F (36.9 C) Temp Source: Oral Pulse Rate: 85 Resp: 18 BP: 132/89 Patient Position (if appropriate): Lying Oxygen Therapy SpO2: 98 % O2 Device: Not Delivered Pain: Pain Assessment Pain Score: 0-No pain  See Function Navigator for Current Functional Status.   Therapy/Group: Individual Therapy  Naijah Lacek K Arnette 11/05/2016, 9:49 AM

## 2016-11-06 DIAGNOSIS — G8191 Hemiplegia, unspecified affecting right dominant side: Secondary | ICD-10-CM

## 2016-11-06 DIAGNOSIS — I69331 Monoplegia of upper limb following cerebral infarction affecting right dominant side: Secondary | ICD-10-CM

## 2016-11-06 DIAGNOSIS — F4323 Adjustment disorder with mixed anxiety and depressed mood: Secondary | ICD-10-CM

## 2016-11-06 NOTE — Progress Notes (Addendum)
Jordan KnackChristopher Thompson is a 51 y.o. male 10/10/1965 [id number]  Subjective: No new complaints. No new problems. Slept well. Feeling OK.  Objective: Vital signs in last 24 hours: Temp:  [97.9 F (36.6 C)-98.1 F (36.7 C)] 97.9 F (36.6 C) (09/08 0535) Pulse Rate:  [82-88] 82 (09/08 0535) Resp:  [17-18] 18 (09/08 0535) BP: (127-130)/(80-90) 127/90 (09/08 0535) SpO2:  [97 %-98 %] 98 % (09/08 0535) Weight:  [212 lb 4.8 oz (96.3 kg)] 212 lb 4.8 oz (96.3 kg) (09/08 0535) Weight change: 1.6 oz (0.045 kg) Last BM Date: 11/05/16  Intake/Output from previous day: 09/07 0701 - 09/08 0700 In: 800 [P.O.:800] Out: 600 [Urine:600] Last cbgs: CBG (last 3)  No results for input(s): GLUCAP in the last 72 hours.   Physical Exam General: No apparent distress   HEENT: not dry Lungs: Normal effort. Lungs clear to auscultation, no crackles or wheezes. Cardiovascular: Regular rate and rhythm, no edema Abdomen: S/NT/ND; BS(+) Musculoskeletal:  unchanged Neurological: No new neurological deficits Wounds: N/A    Skin: clear  Mental state: Alert, oriented, cooperative    Lab Results: BMET    Component Value Date/Time   NA 135 11/05/2016 0534   K 4.1 11/05/2016 0534   CL 100 (L) 11/05/2016 0534   CO2 22 11/05/2016 0534   GLUCOSE 113 (H) 11/05/2016 0534   BUN 11 11/05/2016 0534   CREATININE 0.68 11/05/2016 0534   CALCIUM 9.4 11/05/2016 0534   GFRNONAA >60 11/05/2016 0534   GFRAA >60 11/05/2016 0534   CBC    Component Value Date/Time   WBC 11.1 (H) 11/05/2016 0534   RBC 4.48 11/05/2016 0534   HGB 11.8 (L) 11/05/2016 0534   HCT 38.3 (L) 11/05/2016 0534   PLT 241 11/05/2016 0534   MCV 85.5 11/05/2016 0534   MCH 26.3 11/05/2016 0534   MCHC 30.8 11/05/2016 0534   RDW 19.0 (H) 11/05/2016 0534   LYMPHSABS 2.6 11/02/2016 1101   MONOABS 0.9 11/02/2016 1101   EOSABS 0.3 11/02/2016 1101   BASOSABS 0.1 11/02/2016 1101    Studies/Results: No results found.  Medications: I have  reviewed the patient's current medications.  Assessment/Plan:  1.  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 Plavix. CIR 2. DVT proph - Lovenox 3. HTN. Lasix, Aldactone 4. Liver cirrhosis - diuretics: as above 5. Elevated WBC - cont to monitor 6. Hyperglycemia. Monitoring labs 7. GERD - Protonix   Length of stay, days: 5  Sonda PrimesAlex Mckenlee Mangham , MD 11/06/2016, 1:06 PM

## 2016-11-07 ENCOUNTER — Inpatient Hospital Stay (HOSPITAL_COMMUNITY): Payer: Self-pay

## 2016-11-07 DIAGNOSIS — E876 Hypokalemia: Secondary | ICD-10-CM

## 2016-11-07 NOTE — Progress Notes (Signed)
Occupational Therapy Session Note  Patient Details  Name: Jordan Thompson MRN: [id number] Date of Birth: 1966/01/15  Today's Date: 11/07/2016 OT Individual Time: 4709-6283 OT Individual Time Calculation (min): 60 min    Short Term Goals: Week 1:  OT Short Term Goal 1 (Week 1): Pt will perform toilet transfer with mod A in order to decrease level of assistance with self care.  OT Short Term Goal 2 (Week 1): Pt will engaged in dynamic sitting balance task for 5 minutes with min A for balance. OT Short Term Goal 3 (Week 1): Pt will perform UB dressing with min A with modifications as needed in order to increase I in self care.  Skilled Therapeutic Interventions/Progress Updates:    1:1. Pt seated in w/c upon arrival. Pt reporting needing to void bowel. Pt sit to stand in stedy with MIN A and VC for terminal hip extension and posture and transfer onto toilet. Pt requires increased time to void bowel and requires total A for hygiene and clothing management. OT teaches one handed technique for applying toothpaste to toothbrush and pt competes with supervision. Pt sit to stand at sink with MOD A for lifting and R knee blocking as pt weight bears through RUE on sink. In tx gym, pt stand pivot transfer to R with MOD A for lifting, Vc for handplacement and tactile cues for posture. Pt sits EOM weight bearing through RUE as pt reaches L to obtain connect 4 piece and crosses midline to play piece on board on R side. OT provides tactile cues to facilitate normal trunk elongation and rotation. Pt puts golf ball, a favorite leisure activity, with RUE coban wrapped onto club for weight bearing and OT providing tactile cues for posture while seated EOM. Pt stand pivot transfer w/c to EOB as stated above. Pt EOB>supine with touching A and VC to lift RLE with LLE. Exited session with pt seated in bed with call light in reach and all needs met.  Therapy Documentation Precautions:  Precautions Precautions:  Fall Precaution Comments: left hemiparesis Restrictions Weight Bearing Restrictions: No  See Function Navigator for Current Functional Status.   Therapy/Group: Individual Therapy  [name] 11/07/2016, 2:55 PM

## 2016-11-07 NOTE — Progress Notes (Signed)
Patient ID: Jordan KnackChristopher Thompson, male   DOB: 02/25/1966, 51 y.o.   MRN: [id number]  Jordan Thompson is a 51 y.o. male 02/25/1966 [id number]  Subjective: No new complaints. No new problems. Slept well. Feeling OK.  Objective: Vital signs in last 24 hours: Temp:  [98 F (36.7 C)] 98 F (36.7 C) (09/09 0533) Pulse Rate:  [79] 79 (09/09 0533) Resp:  [18] 18 (09/09 0533) BP: (136)/(84) 136/84 (09/09 0533) SpO2:  [98 %] 98 % (09/09 0533) Weight:  [209 lb (94.8 kg)] 209 lb (94.8 kg) (09/09 0533) Weight change: -3 lb 4.8 oz (-1.497 kg) Last BM Date: 11/06/16  Intake/Output from previous day: 09/08 0701 - 09/09 0700 In: 480 [P.O.:480] Out: 425 [Urine:425] Last cbgs: CBG (last 3)  No results for input(s): GLUCAP in the last 72 hours.   Physical Exam General: No apparent distress  In good spirits HEENT: not dry Lungs: Normal effort. Lungs clear to auscultation, no crackles or wheezes. Cardiovascular: Regular rate and rhythm, no edema Abdomen: S/NT/ND; BS(+) Musculoskeletal:  unchanged Neurological: No new neurological deficits Wounds: N/A    Skin: clear   Mental state: Alert, oriented, cooperative    Lab Results: BMET    Component Value Date/Time   NA 135 11/05/2016 0534   K 4.1 11/05/2016 0534   CL 100 (L) 11/05/2016 0534   CO2 22 11/05/2016 0534   GLUCOSE 113 (H) 11/05/2016 0534   BUN 11 11/05/2016 0534   CREATININE 0.68 11/05/2016 0534   CALCIUM 9.4 11/05/2016 0534   GFRNONAA >60 11/05/2016 0534   GFRAA >60 11/05/2016 0534   CBC    Component Value Date/Time   WBC 11.1 (H) 11/05/2016 0534   RBC 4.48 11/05/2016 0534   HGB 11.8 (L) 11/05/2016 0534   HCT 38.3 (L) 11/05/2016 0534   PLT 241 11/05/2016 0534   MCV 85.5 11/05/2016 0534   MCH 26.3 11/05/2016 0534   MCHC 30.8 11/05/2016 0534   RDW 19.0 (H) 11/05/2016 0534   LYMPHSABS 2.6 11/02/2016 1101   MONOABS 0.9 11/02/2016 1101   EOSABS 0.3 11/02/2016 1101   BASOSABS 0.1 11/02/2016 1101     Studies/Results: No results found.  Medications: I have reviewed the patient's current medications.  Assessment/Plan:  1. 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 Plavix. CIR. 2. DVT proph w/Lovenox 3. HTN: Lasix, Aldactone 4. Liver cirrhosis: Lasix and Aldactone po. 6. Elevated glu - monitoring 7. GERD - on Protonix    Length of stay, days: 6  Sonda PrimesAlex Plotnikov , MD 11/07/2016, 9:32 AM

## 2016-11-07 NOTE — Progress Notes (Signed)
Occupational Therapy Session Note  Patient Details  Name: Jordan KnackChristopher Staszak MRN: [id number] Date of Birth: 1965/05/26  Today's Date: 11/07/2016 OT Individual Time: 0800-0900 OT Individual Time Calculation (min): 60 min    Short Term Goals: Week 1:  OT Short Term Goal 1 (Week 1): Pt will perform toilet transfer with mod A in order to decrease level of assistance with self care.  OT Short Term Goal 2 (Week 1): Pt will engaged in dynamic sitting balance task for 5 minutes with min A for balance. OT Short Term Goal 3 (Week 1): Pt will perform UB dressing with min A with modifications as needed in order to increase I in self care.  Skilled Therapeutic Interventions/Progress Updates:    1:1. Bathing and hemi dressing focus of session. Pt supine to sitting with MIN A for trunk facilitation. Pt sit to stand in stedy with MOD A for lifting and VC for terminal hip extension/weight shifting and transfers onto TTB with total A. Pt bathes at sitting level with supervision for sitting balalnce and HOH A ot RUE to wash LUE  For NMR. OT instructs pt on lateral leaning and washes pt buttocks. Pt sit to stand in stedy as stated above to transfer onto EOB for dressing. Pt requires overall min A for sitting balance while threading BLE into pant using figure 4 with min VC for hemi technique and min guard to hold BLE in figure 4 position. Pt able to recall UB hemi dressing techniques,however requires VC to push sleeve past elbow. Pt transfers onto toilet in stedy and OT completes clothing management. Exited session with pt seated in w/c with call lgiht in reach and RN in room.    Therapy Documentation Precautions:  Precautions Precautions: Fall Precaution Comments: left hemiparesis Restrictions Weight Bearing Restrictions: No  See Function Navigator for Current Functional Status.   Therapy/Group: Individual Therapy  Shon HaleStephanie M Mialynn Shelvin 11/07/2016, 8:51 AM

## 2016-11-07 NOTE — Progress Notes (Signed)
Physical Therapy Session Note  Patient Details  Name: Jordan Thompson MRN: [id number] Date of Birth: 13-May-1965  Today's Date: 11/07/2016 PT Individual Time: 1330-1430 PT Individual Time Calculation (min): 60 min   Short Term Goals: Week 1:  PT Short Term Goal 1 (Week 1): Pt will perform sit<>supine w/ supervision PT Short Term Goal 2 (Week 1): Pt will transfer bed<>chair via squat or stand pivot w/ Max A PT Short Term Goal 3 (Week 1): Pt will ambulated 5' w/ Max A using LRAD PT Short Term Goal 4 (Week 1): Pt will tolerate 15 min of upright activity w/o c/o dizziness and w/ stable vital signs  Skilled Therapeutic Interventions/Progress Updates:    Pt seated in w/c upon PT arrival, agreeable to therapy tx and denies pain. Pt propelled w/c using R hemi technique with supervision to the gym x 100 ft. Pt transferred from w/c to mat, squat pivot transfer with min assist, verbal cues for technique. Pt transferred from sitting to supine with min assist to help lift R LE. Pt transferred from supine to L sidelying with min assist. In sidelying, worked on R LE NMR while using powder board for gravity eliminated strengthening: pt performed 2x 10 hip flexion and hip extension without assist, 2 x 10 knee extension without assist and 1 x 10 knee flexion with hamstring activation but requiring assist to move through entire ROM. Pt transferred back to sitting and from mat>w/c with min assist, squat pivot. Pt performed Sit<>stand without UE support, pushing through R LE with both hands, mod assist. Pt standing using the L handrail for UE support working on lateral weightshifts and R quad activation while stepping forward/backwards with L LE. Pt ambulated 5 ft with max assist for R knee extension and R LE advancement, + 2 for w/c follow and for safety. Pt left seated in w/c at end of session with needs in reach.   Therapy Documentation Precautions:  Precautions Precautions: Fall Precaution Comments: left  hemiparesis Restrictions Weight Bearing Restrictions: No   See Function Navigator for Current Functional Status.   Therapy/Group: Individual Therapy  Cresenciano GenreEmily van Schagen, PT, DPT 11/07/2016, 1:57 PM

## 2016-11-08 ENCOUNTER — Inpatient Hospital Stay (HOSPITAL_COMMUNITY): Payer: Medicaid Other | Admitting: Occupational Therapy

## 2016-11-08 ENCOUNTER — Inpatient Hospital Stay (HOSPITAL_COMMUNITY): Payer: Self-pay

## 2016-11-08 DIAGNOSIS — N501 Vascular disorders of male genital organs: Secondary | ICD-10-CM

## 2016-11-08 DIAGNOSIS — N4889 Other specified disorders of penis: Secondary | ICD-10-CM

## 2016-11-08 NOTE — Progress Notes (Signed)
[facility] PHYSICAL MEDICINE & REHABILITATION     PROGRESS NOTE  Subjective/Complaints:  Pt seen sitting up in his chair this AM, working with OT.  He slept well overnight..  He notes bleeding from his penis and easy bruising this AM. Also, spoke with nurse regarding issue.    ROS: +Bleeding. Denies CP, SOB, N/V/D.  Objective: Vital Signs: Blood pressure 132/88, pulse 85, temperature 97.6 F (36.4 C), temperature source Oral, resp. rate 16, height  (1.778 m), weight 95.3 kg (210 lb), SpO2 97 %. No results found. No results for input(s): WBC, HGB, HCT, PLT in the last 72 hours. No results for input(s): NA, K, CL, GLUCOSE, BUN, CREATININE, CALCIUM in the last 72 hours.  Invalid input(s): CO CBG (last 3)  No results for input(s): GLUCAP in the last 72 hours.  Wt Readings from Last 3 Encounters:  11/08/16 95.3 kg (210 lb)  10/28/16 97.7 kg (215 lb 6.2 oz)    Physical Exam:  BP 132/88 (BP Location: Left Arm)   Pulse 85   Temp 97.6 F (36.4 C) (Oral)   Resp 16   Ht  (1.778 m)   Wt 95.3 kg (210 lb)   SpO2 97%   BMI 30.13 kg/m  Constitutional: He appears well-developed and well-nourished. No distress.  HENT: Normocephalic and atraumatic.  Eyes: EOM are normal. No discharge.  Cardiovascular: RRR.  No JVD Respiratory: Effort normal and breath sounds normal. GI: Bowel sounds are normal. He exhibits no distension. Musculoskeletal: He exhibits no edema or tenderness.  Neurological: He is alert and oriented.  Dysphonia.  Right facial weakness.  Verbose.   RUE: 0/5 proximal to distal (unchanged) RLE: 3-/5 HF, KE, 0/5 ADF/PF (improving) LUE/LLE: 5/5 proximal to distal Skin: Skin is warm and dry. He is not diaphoretic. +Bruising noted.  Psychiatric: He has a normal mood and affect. His behavior is normal. Thought content normal.   Assessment/Plan: 1. Functional deficits secondary to left medullary infarct, bilateral cerebellar infarcts which require 3+ hours per day of  interdisciplinary therapy in a comprehensive inpatient rehab setting. Physiatrist is providing close team supervision and 24 hour management of active medical problems listed below. Physiatrist and rehab team continue to assess barriers to discharge/monitor patient progress toward functional and medical goals.  Function:  Bathing Bathing position   Position: Shower  Bathing parts Body parts bathed by patient: Right arm, Chest, Abdomen, Right upper leg, Left upper leg, Left lower leg, Front perineal area Body parts bathed by helper: Left arm, Buttocks, Right lower leg, Left lower leg, Back  Bathing assist Assist Level:  (MOD A)      Upper Body Dressing/Undressing Upper body dressing   What is the patient wearing?: Pull over shirt/dress     Pull over shirt/dress - Perfomed by patient: Thread/unthread left sleeve, Put head through opening, Pull shirt over trunk, Thread/unthread right sleeve Pull over shirt/dress - Perfomed by helper: Thread/unthread right sleeve        Upper body assist Assist Level: Supervision or verbal cues      Lower Body Dressing/Undressing Lower body dressing   What is the patient wearing?: Pants, Socks, Shoes     Pants- Performed by patient: Thread/unthread left pants leg, Thread/unthread right pants leg Pants- Performed by helper: Pull pants up/down Non-skid slipper socks- Performed by patient: Don/doff right sock, Don/doff left sock Non-skid slipper socks- Performed by helper: Don/doff right sock, Don/doff left sock Socks - Performed by patient: Don/doff left sock Socks - Performed by helper:  Don/doff right sock Shoes - Performed by patient: Don/doff left shoe Shoes - Performed by helper: Don/doff right shoe, Fasten right, Fasten left          Lower body assist Assist for lower body dressing: Touching or steadying assistance (Pt > 75%)      Toileting Toileting   Toileting steps completed by patient: Adjust clothing prior to toileting, Adjust  clothing after toileting Toileting steps completed by helper: Adjust clothing prior to toileting, Performs perineal hygiene, Adjust clothing after toileting Toileting Assistive Devices: Toilet aid  Toileting assist Assist level: More than reasonable time, Set up/obtain supplies   Transfers Chair/bed transfer Chair/bed transfer activity did not occur: N/A Chair/bed transfer method: Squat pivot Chair/bed transfer assist level: Touching or steadying assistance (Pt > 75%) Chair/bed transfer assistive device: Armrests Mechanical lift: Stedy   Locomotion Ambulation Ambulation activity did not occur: Safety/medical concerns (R hemiplegia w/ 0/5 MMT globally)   Max distance: 5 ft Assist level: Maximal assist (Pt 25 - 49%) (2nd person for w/c follow)   Wheelchair   Type: Manual Max wheelchair distance: 100 ft Assist Level: Supervision or verbal cues  Cognition Comprehension Comprehension assist level: Follows complex conversation/direction with extra time/assistive device  Expression Expression assist level: Expresses complex ideas: With extra time/assistive device  Social Interaction Social Interaction assist level: Interacts appropriately with others with medication or extra time (anti-anxiety, antidepressant).  Problem Solving Problem solving assist level: Solves basic problems with no assist  Memory Memory assist level: Recognizes or recalls 90% of the time/requires cueing < 10% of the time    Medical Problem List and Plan: 1.  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   Cont CIR  WHO/PRAFO  2.  DVT Prophylaxis/Anticoagulation: Pharmaceutical: Lovenox d/ced due to bruising and penile bleeding 3. HA/Pain Management: tylenol prn for HA.  4. Mood: LCSW to follow for evaluation and support. Team to provide ego support.  5. Neuropsych: This patient is capable of making decisions on his own behalf. 6. Skin/Wound Care: routine  pressure relief measures.  7. Fluids/Electrolytes/Nutrition: Monitor I/Os  BMP within acceptable range on 9/7 8. HTN: Monitor BP bid--continue lasix and aldactone.   Controlled 9/10  Cont to monitor 9. Cirrhosis of liver: [facility]. New diagnosis post decompensation. Resumed low salt restrictions with lasix and aldactone.   Decreased lactulose to bid and check ammonia levels tomorrow (recent level 28).   Will continue to monitor for encephalopathy.  Monitor weights daily. 2 gram tylenol max/day 9. Leucocytosis: Monitor for signs of infection--likely reactive.   WBCs 11.1 on 9/7  Labs ordered for tomorrow  Afebrile  Cont to monitor 10.  Prediabetes: Hgb A1c- 6.1.  Added CM restrictions. Consulted dietitian to reinforce education.   Relatively controlled 9/7 11. ABLA  Hb 11.8 on 9/7  Labs ordered for tomorrow  Cont to monitor  LOS (Days) 7 A FACE TO FACE EVALUATION WAS PERFORMED  Makayli Bracken Karis Jubanil Shakeem Stern 11/08/2016 10:22 AM

## 2016-11-08 NOTE — Progress Notes (Signed)
Occupational Therapy Weekly Progress Note  Patient Details  Name: Jordan Thompson MRN: [id number] Date of Birth: 1965/06/02  Beginning of progress report period: November 02, 2016 End of progress report period: November 08, 2016  Today's Date: 11/08/2016 OT Individual Time: 1432-1530 OT Individual Time Calculation (min): 58 min    Patient has met 3 of 3 short term goals.  [name] continues to make steady progress with OT.  Currently he is able to perform squat pivot transfers to the left and right with min to mod assist.  Stand pivot transfers to the toilet or walk-in shower are still at a max assist level secondary to LLE weakness.  Sit to stand with LB bathing and dressing is currently at a mod assist with mod facilitation needed to maintain activation of the right knee extensors.  He demonstrates Brunnstrum stage II movement in the right arm with stage I in the hand.  Max hand over hand assistance is needed to integrate the RUE into bathing tasks at this time.  Feel he is on target to meet supervision/min assist level goals for discharge.  Recommend continued CIR level therapy at this time.    Patient continues to demonstrate the following deficits: muscle weakness, impaired timing and sequencing, unbalanced muscle activation and decreased coordination and decreased sitting balance, decreased standing balance, hemiplegia and decreased balance strategies and therefore will continue to benefit from skilled OT intervention to enhance overall performance with BADL and Reduce care partner burden.  Patient progressing toward long term goals..  Continue plan of care.  OT Short Term Goals Week 2:  OT Short Term Goal 1 (Week 2): Pt will complete LB bathing with min assist sit to stand.  OT Short Term Goal 2 (Week 2): Pt will complete LB dressing sit to stand with mod assist.   OT Short Term Goal 3 (Week 2): Pt will complete toilet transfer with mod assist stand pivot. OT Short Term Goal 4  (Week 2): Pt will perform shower transfer with mod assist stand pivot to walk-in shower.  OT Short Term Goal 5 (Week 2): Pt will complete RUE AAROM exercises following handout with supervision.   Skilled Therapeutic Interventions/Progress Updates:    Pt worked on Brewing technologist for the right trunk and RUE during session.  Had pt transfer to the therapy mat with mod assist for squat pivot transfer to the right side.  Placed RUE in weightbearing beside him on the mat with focus on small weightshifts down on the RUE while attempting to use the right arm to push back up to midline.  Mod facilitation for the activation of the right trunk along with right elbow extension.  Had pt keep head laterally flexed to the right as well during movement in order to decrease use of the left trunk.  Worked on reciprical scooting as well with emphasis on activation of the right trunk and RUE.  Min assist to maintain contact with the surface using the RUE while completing reciprical scooting.  Finished session using tilted stool and having pt work on small movements of shoulder flexion and elbow extension on the stool.  He was able to activate the right shoulder and push to target multiple repetitions ( more than 30 with rest breaks).  Pt left in gym with PT present to take over for next session.    Therapy Documentation Precautions:  Precautions Precautions: Fall Precaution Comments: left hemiparesis Restrictions Weight Bearing Restrictions: No  Pain: Pain Assessment Pain Assessment: No/denies pain ADL: See Function  Navigator for Current Functional Status.   Therapy/Group: Individual Therapy  Netha Dafoe OTR/L 11/08/2016, 4:05 PM

## 2016-11-08 NOTE — Progress Notes (Signed)
Occupational Therapy Session Note  Patient Details  Name: Jordan Thompson MRN: [id number] Date of Birth: Aug 08, 1965  Today's Date: 11/08/2016 OT Individual Time: 0800-0903 OT Individual Time Calculation (min): 63 min    Short Term Goals: Week 1:  OT Short Term Goal 1 (Week 1): Pt will perform toilet transfer with mod A in order to decrease level of assistance with self care.  OT Short Term Goal 2 (Week 1): Pt will engaged in dynamic sitting balance task for 5 minutes with min A for balance. OT Short Term Goal 3 (Week 1): Pt will perform UB dressing with min A with modifications as needed in order to increase I in self care.  Skilled Therapeutic Interventions/Progress Updates:    Pt completed bathing and dressing sit to stand at the sink this session.  Min assist for squat pivot transfer to the left to the wheelchair.  Once at the sink he completed all UB bathing with min assist, max hand over hand to initiate and complete use of the RUE for washing the left arm.  UB dressing with supervision and increased time.  He needed mod assist for crossing the RLE over the left knee and maintaining in order to wash his foot and then donn clothing.  Mod assist for sit to stand to wash peri area and buttocks as well as pulling shorts over hips.  Once completed he finished grooming tasks at the sink with supervision.  Pt left up in wheelchair with call button and phone in reach.    Therapy Documentation Precautions:  Precautions Precautions: Fall Precaution Comments: left hemiparesis Restrictions Weight Bearing Restrictions: No   Pain: Pain Assessment Pain Assessment: No/denies pain Pain Score: 0-No pain ADL: See Function Navigator for Current Functional Status.   Therapy/Group: Individual Therapy  Kayela Humphres OTR/L /11/08/2016, 12:28 PM

## 2016-11-08 NOTE — Progress Notes (Signed)
Physical Therapy Session Note  Patient Details  Name: Jordan KnackChristopher Boileau MRN: [id number] Date of Birth: [birth year]  Today's Date: 11/08/2016 PT Individual Time: 1000-1100, 1530-1653 PT Individual Time Calculation (min): 60 min , 83 min   Short Term Goals: Week 1:  PT Short Term Goal 1 (Week 1): Pt will perform sit<>supine w/ supervision PT Short Term Goal 2 (Week 1): Pt will transfer bed<>chair via squat or stand pivot w/ Max A PT Short Term Goal 3 (Week 1): Pt will ambulated 5' w/ Max A using LRAD PT Short Term Goal 4 (Week 1): Pt will tolerate 15 min of upright activity w/o c/o dizziness and w/ stable vital signs  Skilled Therapeutic Interventions/Progress Updates:   Session 1:  Pt seated in w/c upon PT arrival, agreeable to therapy tx and denies pain. Pt propelled w/c to the gym using L hemi technique, with supervision. Pt reported needing to use the bathroom. Pt transported back to room total assist. Pt transferred from w/c <> toilet using stedy total assist, able to stand in stedy with min assist using L UE to pull up. Pt performed toileting and peri area cleaning with mod assist to help support pt standing while using L UE to clean. Pt transported back to gym total assist. Pt transferred w/c<>mat with min assist and verbal cues for w/c management. Session focused on R LE NMR and standing balance. Pt performed 2 x 5 sit to stands from elevated mat and using L UE to hold R hand over R knee for increased weightbearing and awareness to R side. In standing focused on R quad activation with verbal and tactile cues, along with lateral weightshifts over R LE. Pt performed x 3 mini squats without UE support, max assist with focus on symmetric weightbearing through LEs. Pt transferred sitting<>supine<>sidelying in order to perform R LE exercises on powder board for R LE NMR to include 2 x 10 hip flexion and 2 x 10 hip extension with 2 x 10 knee extension. Pt left in w/c at end of session with needs in  reach.   Session 2:  Pt received from OT, seated in w/c and agreeable to therapy tx. Pt denies pain this session. Pt participated in body weight supported overground training with litegait system to work on lateral weightshifts, R knee and hip extension in stance, stepping forward/backward with R LE and pt ambulated x 15 ft with max assist for R knee control and advancement, facilitation for weightshift. Pt reported needing to have a BM. Pt transported back to room in w/c total assist. Pt transferred from w/c <> toilet using stedy total assist, able to stand in stedy with min assist using L UE to pull up. Pt performed toileting and peri area cleaning with mod assist to help support pt standing while using L UE to clean. Pt performed 3 trials of ambulation: x 10 ft, x 10 ft and x 14 ft with max assist progressing to mod assist for last trial. During ambulation pt requiring manual facilitation for lateral weightshifting, sequencing, and R knee extension during stance. Pt able to activate R hip flexor and quads to help advance the R LE during swing phase. During the last tiral, used ACE wrap for DF assist in order to help clear toes, pt with improved swing phase and requiring assist for foot placement to prevent scissoring due to decrease abductor strength. Pt transferred from w/c to bed at end of session with min assist, squat pivot. Sitting>supine with min assist and left supine  in bed with needs in reach.   Therapy Documentation Precautions:  Precautions Precautions: Fall Precaution Comments: left hemiparesis Restrictions Weight Bearing Restrictions: No   See Function Navigator for Current Functional Status.   Therapy/Group: Individual Therapy  Cresenciano Genre, PT, DPT 11/08/2016, 10:07 AM

## 2016-11-09 ENCOUNTER — Inpatient Hospital Stay (HOSPITAL_COMMUNITY): Payer: Self-pay | Admitting: Physical Therapy

## 2016-11-09 ENCOUNTER — Inpatient Hospital Stay (HOSPITAL_COMMUNITY): Payer: Self-pay

## 2016-11-09 ENCOUNTER — Inpatient Hospital Stay (HOSPITAL_COMMUNITY): Payer: Medicaid Other | Admitting: Occupational Therapy

## 2016-11-09 DIAGNOSIS — E871 Hypo-osmolality and hyponatremia: Secondary | ICD-10-CM

## 2016-11-09 LAB — COMPREHENSIVE METABOLIC PANEL
ALK PHOS: 68 U/L (ref 38–126)
ALT: 16 U/L — ABNORMAL LOW (ref 17–63)
ANION GAP: 9 (ref 5–15)
AST: 26 U/L (ref 15–41)
Albumin: 3.3 g/dL — ABNORMAL LOW (ref 3.5–5.0)
BUN: 8 mg/dL (ref 6–20)
CHLORIDE: 101 mmol/L (ref 101–111)
CO2: 24 mmol/L (ref 22–32)
Calcium: 9.3 mg/dL (ref 8.9–10.3)
Creatinine, Ser: 0.71 mg/dL (ref 0.61–1.24)
GFR calc non Af Amer: >60 mL/min (ref >60)
Glucose, Bld: 107 mg/dL — ABNORMAL HIGH (ref 65–99)
Potassium: 3.7 mmol/L (ref 3.5–5.1)
SODIUM: 134 mmol/L — AB (ref 135–145)
TOTAL PROTEIN: 7.7 g/dL (ref 6.5–8.1)
Total Bilirubin: 0.7 mg/dL (ref 0.3–1.2)

## 2016-11-09 LAB — CBC WITH DIFFERENTIAL/PLATELET
BASOS PCT: 1 %
Basophils Absolute: 0.1 10*3/uL (ref 0.0–0.1)
EOS ABS: 0.4 10*3/uL (ref 0.0–0.7)
Eosinophils Relative: 3 %
HCT: 37 % — ABNORMAL LOW (ref 39.0–52.0)
Hemoglobin: 11.5 g/dL — ABNORMAL LOW (ref 13.0–17.0)
Lymphocytes Relative: 26 %
Lymphs Abs: 2.9 10*3/uL (ref 0.7–4.0)
MCH: 26.6 pg (ref 26.0–34.0)
MCHC: 31.1 g/dL (ref 30.0–36.0)
MCV: 85.5 fL (ref 78.0–100.0)
MONO ABS: 0.7 10*3/uL (ref 0.1–1.0)
MONOS PCT: 7 %
Neutro Abs: 7 10*3/uL (ref 1.7–7.7)
Neutrophils Relative %: 63 %
Platelets: 250 10*3/uL (ref 150–400)
RBC: 4.33 MIL/uL (ref 4.22–5.81)
RDW: 18.7 % — AB (ref 11.5–15.5)
WBC: 11.1 10*3/uL — ABNORMAL HIGH (ref 4.0–10.5)

## 2016-11-09 LAB — AMMONIA: AMMONIA: 25 umol/L (ref 9–35)

## 2016-11-09 MED ORDER — LACTULOSE 10 GM/15ML PO SOLN
30.0000 g | Freq: Every day | ORAL | Status: DC
  Administered 2016-11-09 – 2016-11-22 (×14): 30 g via ORAL
  Filled 2016-11-09 (×14): qty 45

## 2016-11-09 NOTE — Progress Notes (Signed)
Physical Therapy Weekly Progress Note  Patient Details  Name: [name] MRN: [id number] Date of Birth: 1965-12-01  Beginning of progress report period: November 02, 2016 End of progress report period: November 09, 2016  Today's Date: 11/09/2016 PT Individual Time: 3276-1470 PT Individual Time Calculation (min): 45 min   Patient has met 3 of 4 short term goals. Pt currently requires Min A for bed mobility, Mod A for sit<>stand transfers, Min A for bed<>chair transfers via squat pivot, and is ambulating up to 10' w/ rail in hallway and Max A. He is also transitioning from using rail in hallway to hemiwalker on L side for standing and gait tasks. He continues to require manual and tactile cues w/ R quad activation and R knee block during standing and gait.   Patient continues to demonstrate the following deficits muscle weakness, decreased cardiorespiratoy endurance, impaired timing and sequencing, decreased coordination and decreased motor planning and decreased sitting balance, decreased standing balance, decreased postural control, hemiplegia and decreased balance strategies and therefore will continue to benefit from skilled PT intervention to increase functional independence with mobility.  Patient progressing toward long term goals..  Continue plan of care.  PT Short Term Goals Week 1:  PT Short Term Goal 1 (Week 1): Pt will perform sit<>supine w/ supervision PT Short Term Goal 1 - Progress (Week 1): Progressing toward goal PT Short Term Goal 2 (Week 1): Pt will transfer bed<>chair via squat or stand pivot w/ Max A PT Short Term Goal 2 - Progress (Week 1): Met PT Short Term Goal 3 (Week 1): Pt will ambulated 5' w/ Max A using LRAD PT Short Term Goal 3 - Progress (Week 1): Met PT Short Term Goal 4 (Week 1): Pt will tolerate 15 min of upright activity w/o c/o dizziness and w/ stable vital signs PT Short Term Goal 4 - Progress (Week 1): Met Week 2:  PT Short Term Goal 1 (Week 2):  Pt will ambulate 25' Mod A w/ LRAD  PT Short Term Goal 2 (Week 2): Pt will maintain static standing balance w/ unilateral UE support and Mod A PT Short Term Goal 3 (Week 2): Pt will perform bed<>chair transfer w/ supervision  PT Short Term Goal 4 (Week 2): Pt will maintain dynamic sitting balance w/ supervision  Skilled Therapeutic Interventions/Progress Updates:   Pt received in PT gym in care of staff while in w/c, agreeable to therapy and no c/o pain.   Worked on endurance w/ functional mobility and LE strengthening this session.  -NuStep @ L1 w/o UE assist, 25 min w/ 1 short break secondary to fatigue  -w/c mobility to/from dayroom, ~150' each way, verbal cues for direction, utilized LUE/LE hemi technique for self-propulsion  Ended session in recliner, call bell within reach and all needs met.   Therapy Documentation Precautions:  Precautions Precautions: Fall Precaution Comments: left hemiparesis Restrictions Weight Bearing Restrictions: No   See Function Navigator for Current Functional Status.  Therapy/Group: Individual Therapy  Elya Diloreto K Arnette 11/09/2016, 3:47 PM

## 2016-11-09 NOTE — Progress Notes (Signed)
Physical Therapy Session Note  Patient Details  Name: Phillis KnackChristopher Maita MRN: [id number] Date of Birth: [birth year]  Today's Date: 11/09/2016 PT Individual Time:  0902-1000 58 minutes  Short Term Goals: Week 1:  PT Short Term Goal 1 (Week 1): Pt will perform sit<>supine w/ supervision PT Short Term Goal 2 (Week 1): Pt will transfer bed<>chair via squat or stand pivot w/ Max A PT Short Term Goal 3 (Week 1): Pt will ambulated 5' w/ Max A using LRAD PT Short Term Goal 4 (Week 1): Pt will tolerate 15 min of upright activity w/o c/o dizziness and w/ stable vital signs  Skilled Therapeutic Interventions/Progress Updates:    Pt seated in w/c upon PT arrival received from OT, agreeable to therapy tx and denies pain. Pt propelled w/c to gym x 150 ft using L hemi technique with supervision. Pt performed squat pivot transfer w/c<>mat with min assist, verbal cues for technique. Pt performed sit<>stands x 5 with emphasis on symmetric weightbearing, using hemi walker in standing for L UE support. Pt performed x 2 sit<>stands with dina disc under L foot in order to increase R LE weightbearing. Pt ambulated x 10 ft using the L handrail for UE support, max assist required to prevent R knee buckling, verbal and tactile cues for R quad activation in stance, AFO used to help with toe clearance. Pt with increased R lateral/anterior lean during R weightshift, worked on standing and shifting over R LE using the mirror for feedback to help maintain upright trunk. Pt transferred from w/c>bed squat pivot with min assist, and sitting to supine with min assist. Pt left supine in bed with needs in reach.   Therapy Documentation Precautions:  Precautions Precautions: Fall Precaution Comments: left hemiparesis Restrictions Weight Bearing Restrictions: No   See Function Navigator for Current Functional Status.   Therapy/Group: Individual Therapy  Cresenciano GenreEmily van Schagen, PT, DPT 11/09/2016, 8:43 AM

## 2016-11-09 NOTE — Progress Notes (Signed)
[facility] PHYSICAL MEDICINE & REHABILITATION     PROGRESS NOTE  Subjective/Complaints:  Pt seen sitting up in bed this AM.  He slept well overnight and notes his bleeding has stopped.   ROS: Denies CP, SOB, N/V/D.  Objective: Vital Signs: Blood pressure 121 [id number]$5\' 10"4 %. No results found.  Recent Labs  11/09/16 0540  WBC 11.1*  HGB 11.5*  HCT 37.0*  PLT 250    Recent Labs  11/09/16 0540  NA 134*  K 3.7  CL 101  GLUCOSE 107*  BUN 8  CREATININE 0.71  CALCIUM 9.3   CBG (last 3)  No results for input(s): GLUCAP in the last 72 hours.  Wt Readings from Last 3 Encounters:  11/09/16 95.1 kg (209 lb 9.6 oz)  10/28/16 97.7 kg (215 lb 6.2 oz)    Physical Exam:  BP 121/79 (BP Location: Left Arm)   Pulse 77   Temp 98 F (36.7 C) (Oral)   Resp 18   Ht  (1.778 m)   Wt 95.1 kg (209 lb 9.6 oz)   SpO2 94%   BMI 30.07 kg/m  Constitutional: He appears well-developed and well-nourished. No distress.  HENT: Normocephalic and atraumatic.  Eyes: EOM are normal. No discharge.  Cardiovascular: RRR.  No JVD Respiratory: Effort normal and breath sounds normal. GI: Bowel sounds are normal. He exhibits no distension. Musculoskeletal: He exhibits no edema or tenderness.  Neurological: He is alert and oriented.  Dysphonia.  Right facial weakness.  Verbose.   RUE: 0/5 proximal to distal (stable) RLE: 3-/5 HF, KE, 0/5 ADF/PF (stable) LUE/LLE: 5/5 proximal to distal Skin: Skin is warm and dry. He is not diaphoretic. +Bruising noted.  Psychiatric: He has a normal mood and affect. His behavior is normal. Thought content normal.   Assessment/Plan: 1. Functional deficits secondary to left medullary infarct, bilateral cerebellar infarcts which require 3+ hours per day of interdisciplinary therapy in a comprehensive inpatient rehab  setting. Physiatrist is providing close team supervision and 24 hour management of active medical problems listed below. Physiatrist and rehab team continue to assess barriers to discharge/monitor patient progress toward functional and medical goals.  Function:  Bathing Bathing position   Position: Shower  Bathing parts Body parts bathed by patient: Right arm, Chest, Abdomen, Right upper leg, Left upper leg, Left lower leg Body parts bathed by helper: Left arm, Buttocks, Front perineal area, Right lower leg, Back  Bathing assist Assist Level:  (MOD A)      Upper Body Dressing/Undressing Upper body dressing   What is the patient wearing?: Pull over shirt/dress     Pull over shirt/dress - Perfomed by patient: Thread/unthread left sleeve, Put head through opening, Pull shirt over trunk, Thread/unthread right sleeve Pull over shirt/dress - Perfomed by helper: Thread/unthread right sleeve        Upper body assist Assist Level: Supervision or verbal cues      Lower Body Dressing/Undressing Lower body dressing   What is the patient wearing?: Pants, Socks, Shoes     Pants- Performed by patient: Thread/unthread left pants leg Pants- Performed by helper: Thread/unthread right pants leg, Pull pants up/down Non-skid slipper socks- Performed by patient: Don/doff right sock, Don/doff left sock Non-skid slipper socks- Performed by helper: Don/doff right sock, Don/doff left sock Socks - Performed by patient: Don/doff left sock Socks - Performed by  helper: Don/doff right sock Shoes - Performed by patient: Don/doff left shoe Shoes - Performed by helper: Fasten right, Fasten left, Don/doff right shoe          Lower body assist Assist for lower body dressing: Touching or steadying assistance (Pt > 75%)      Toileting Toileting   Toileting steps completed by patient: Adjust clothing prior to toileting, Adjust clothing after toileting Toileting steps completed by helper: Adjust clothing  prior to toileting, Performs perineal hygiene, Adjust clothing after toileting Toileting Assistive Devices: Toilet aid  Toileting assist Assist level: More than reasonable time, Set up/obtain supplies   Transfers Chair/bed transfer Chair/bed transfer activity did not occur: N/A Chair/bed transfer method: Squat pivot Chair/bed transfer assist level: Touching or steadying assistance (Pt > 75%) Chair/bed transfer assistive device: Armrests Mechanical lift: Stedy   Locomotion Ambulation Ambulation activity did not occur: Safety/medical concerns (R hemiplegia w/ 0/5 MMT globally)   Max distance: 5 ft Assist level: Maximal assist (Pt 25 - 49%) (2nd person for w/c follow)   Wheelchair   Type: Manual Max wheelchair distance: 100 ft Assist Level: Supervision or verbal cues  Cognition Comprehension Comprehension assist level: Follows complex conversation/direction with extra time/assistive device  Expression Expression assist level: Expresses complex ideas: With extra time/assistive device  Social Interaction Social Interaction assist level: Interacts appropriately with others with medication or extra time (anti-anxiety, antidepressant).  Problem Solving Problem solving assist level: Solves basic problems with no assist  Memory Memory assist level: Recognizes or recalls 90% of the time/requires cueing < 10% of the time    Medical Problem List and Plan: 1.  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   Cont CIR  WHO/PRAFO  2.  DVT Prophylaxis/Anticoagulation: Pharmaceutical: Lovenox d/ced due to bruising and penile bleeding 3. HA/Pain Management: tylenol prn for HA.  4. Mood: LCSW to follow for evaluation and support. Team to provide ego support.  5. Neuropsych: This patient is capable of making decisions on his own behalf. 6. Skin/Wound Care: routine pressure relief measures.  7. Fluids/Electrolytes/Nutrition: Monitor I/Os 8. HTN:  Monitor BP bid--continue lasix and aldactone.   Controlled 9/11  Cont to monitor 9. Cirrhosis of liver: [facility] New diagnosis post decompensation. Resumed low salt restrictions with lasix and aldactone.   Decreased lactulose again on 9/11  Will continue to monitor for encephalopathy.  Monitor weights daily. 2 gram tylenol max/day 9. Leucocytosis: Monitor for signs of infection--likely reactive.   WBCs 11.1 on 9/11  Afebrile  Cont to monitor 10.  Prediabetes: Hgb A1c- 6.1.  Added CM restrictions. Consulted dietitian to reinforce education.   Relatively controlled 9/11 11. ABLA  Hb 11.5 on 9/11  Cont to monitor 12. Hyponatremia  Na+ 134 on 9/11  Cont to monitor  LOS (Days) 8 A FACE TO FACE EVALUATION WAS PERFORMED  Jillana Selph Karis Jubanil Ellora Varnum 11/09/2016 9:20 AM

## 2016-11-09 NOTE — Progress Notes (Signed)
Occupational Therapy Session Note  Patient Details  Name: Jordan Thompson MRN: [id number] Date of Birth: 1965-05-07  Today's Date: 11/09/2016 OT Individual Time: [id number] OT Individual Time Calculation (min): 71 min    Short Term Goals: Week 2:  OT Short Term Goal 1 (Week 2): Pt will complete LB bathing with min assist sit to stand.  OT Short Term Goal 2 (Week 2): Pt will complete LB dressing sit to stand with mod assist.   OT Short Term Goal 3 (Week 2): Pt will complete toilet transfer with mod assist stand pivot. OT Short Term Goal 4 (Week 2): Pt will perform shower transfer with mod assist stand pivot to walk-in shower.  OT Short Term Goal 5 (Week 2): Pt will complete RUE AAROM exercises following handout with supervision.   Skilled Therapeutic Interventions/Progress Updates:    Pt completed transfer from supine to sit EOB with mod assist, on the right side of the bed.  He then transferred from the bed to the wheelchair with min assist squat pivot to the left side.  He then propelled his wheelchair down to the therapy gym where he transferred to the mat, on the right side squat pivot with mod assist.  Worked on the mat in supine on rolling to the left side, sequencing transfer by flexing up the RLE, bringing the RUE across his body with assist from the LUE, and activating the abdominals for rolling.  He was able to complete with min assist for flexing the RLE up only.  He was able to manage all other aspects.  He also worked on transitioning to sitting from sidelying with min assist for bringing the RUE off of the edge of the therapy mat.  Transitioned to sitting next and applied NMES to the right dorsal forearm for activation of digit extensors.  Pt tolerated 20 mins of active stimulation with 10 second on/off cycle.  PPS at 35, pulse width at 300, and intensity on 27.  Pt was instructed to assist with active extension during stimulation and then when they relaxed to attempt digit flexion.   No active movement noted with flexion or extension at this time when stimulation was active.  Pt finished session with transfer back to the wheelchair and remained in the therapy gym waiting on PT for next session.    Therapy Documentation Precautions:  Precautions Precautions: Fall Precaution Comments: left hemiparesis Restrictions Weight Bearing Restrictions: No   Pain: Pain Assessment Pain Assessment: No/denies pain ADL: See Function Navigator for Current Functional Status.   Therapy/Group: Individual Therapy  Jordan Thompson OTR/L 11/09/2016, 4:15 PM

## 2016-11-09 NOTE — Progress Notes (Signed)
Occupational Therapy Session Note  Patient Details  Name: Jordan KnackChristopher Thompson MRN: [id number] Date of Birth: Apr 26, 1965  Today's Date: 11/09/2016 OT Individual Time: 0800-0900 OT Individual Time Calculation (min): 60 min    Short Term Goals: Week 2:  OT Short Term Goal 1 (Week 2): Pt will complete LB bathing with min assist sit to stand.  OT Short Term Goal 2 (Week 2): Pt will complete LB dressing sit to stand with mod assist.   OT Short Term Goal 3 (Week 2): Pt will complete toilet transfer with mod assist stand pivot. OT Short Term Goal 4 (Week 2): Pt will perform shower transfer with mod assist stand pivot to walk-in shower.  OT Short Term Goal 5 (Week 2): Pt will complete RUE AAROM exercises following handout with supervision.   Skilled Therapeutic Interventions/Progress Updates:    Pt completed bathing and dressing during session.  Mod assist for transfer from bed to wheelchair squat pivot, with max assist for stand pivot transfer from wheelchair to tub bench.  Once he completed transfer he was able to perform bathing with mod assist overall sit to stand.  Max hand over hand assistance needed for washing the LUE with use of the right hand.  He needed mod assist for sit to stand in order to complete washing peri area.  Completed stand pivot transfer back to the wheelchair with max assist in order to work on dressing tasks.  Min assist for crossing the RLE over the left knee for dressing tasks.  Pt unable to maintain it crossed and needs assist to complete and hold.  Supervision for UB dressing and grooming tasks from seated position.  Finished session with pt in wheelchair and PT present.      Therapy Documentation Precautions:  Precautions Precautions: Fall Precaution Comments: left hemiparesis Restrictions Weight Bearing Restrictions: No   Pain: Pain Assessment Pain Assessment: No/denies pain Pain Score: 0-No pain ADL: See Function Navigator for Current Functional  Status.   Therapy/Group: Individual Therapy  Laynee Lockamy OTR/L 11/09/2016, 11:20 AM

## 2016-11-10 ENCOUNTER — Inpatient Hospital Stay (HOSPITAL_COMMUNITY): Payer: Self-pay

## 2016-11-10 ENCOUNTER — Inpatient Hospital Stay (HOSPITAL_COMMUNITY): Payer: Medicaid Other | Admitting: Occupational Therapy

## 2016-11-10 ENCOUNTER — Encounter (HOSPITAL_COMMUNITY): Payer: Self-pay | Admitting: Psychology

## 2016-11-10 NOTE — Progress Notes (Signed)
Occupational Therapy Session Note  Patient Details  Name: Phillis KnackChristopher Randhawa MRN: [id number] Date of Birth: 14-Jul-1965  Today's Date: 11/10/2016 OT Individual Time: 4098-11910800-0904 OT Individual Time Calculation (min): 64 min    Short Term Goals: Week 2:  OT Short Term Goal 1 (Week 2): Pt will complete LB bathing with min assist sit to stand.  OT Short Term Goal 2 (Week 2): Pt will complete LB dressing sit to stand with mod assist.   OT Short Term Goal 3 (Week 2): Pt will complete toilet transfer with mod assist stand pivot. OT Short Term Goal 4 (Week 2): Pt will perform shower transfer with mod assist stand pivot to walk-in shower.  OT Short Term Goal 5 (Week 2): Pt will complete RUE AAROM exercises following handout with supervision.   Skilled Therapeutic Interventions/Progress Updates:    Session 1: Pt completed bathing and dressing sit to stand at the sink this session.  Mod assist for sit to stand from the wheelchair during LB selfcare and for shaving task.  He needed min assist for UB bathing with supervision for UB dressing.  He needed mod assist for crossing the LLE over the right knee and maintaining when donning LB clothing (socks, and shoes).  Therapist applied shoe buttons to both shoes to compensate for tying but he will need practice using them.  Also issued rocker knife to assist with cutting food as well as he does not want it chopped up finely from the cafeteria.  Pt left in wheelchair with call button and phone in reach.    Session 2:  (13:02-14:01 59 mins)  Pt completed wheelchair mobility down to the therapy gym with supervision and then transferred to the therapy mat with min assist squat pivot to the left.  He worked on LUE weightbearing in sitting, while reaching across midline to the right side.  Noted active elbow extension in the RUE but difficult to maintain.  Needed min assist for balance with far reach secondary to LOB and not being able to use the RUE to assist with return  to midline.  Transitioned to supine with use of dowel rod for bilateral shoulder flexion.  Applied ace bandage to the left hand to assist with grip.  Max assist for shoulder flexion and holding the RUE into full elbow extension at 90 degrees flexion.  Noted activity in the right shoulder and elbow but needs max overall assist.  Transitioned back to sitting with max assist for transition from right sidelying to sit with max facilitation from the RUE and trunk.  Finished session with transfer back to the wheelchair and return to the room.  Pt left in wheelchair with call button and phone in reach.    Therapy Documentation Precautions:  Precautions Precautions: Fall Precaution Comments: left hemiparesis Restrictions Weight Bearing Restrictions: No    Pain: No report of pain during session  See Function Navigator for Current Functional Status.   Therapy/Group: Individual Therapy  Bevin Das OTR/L 11/10/2016, 2:16 PM

## 2016-11-10 NOTE — Progress Notes (Signed)
Physical Therapy Session Note  Patient Details  Name: Jordan Thompson MRN: [id number] Date of Birth: 1965-04-14  Today's Date: 11/10/2016 PT Individual Time: 1001-1101, 1515- 1546 PT Individual Time Calculation (min): 60 min , 46 min  Short Term Goals: Week 2:  PT Short Term Goal 1 (Week 2): Pt will ambulate 25' Mod A w/ LRAD  PT Short Term Goal 2 (Week 2): Pt will maintain static standing balance w/ unilateral UE support and Mod A PT Short Term Goal 3 (Week 2): Pt will perform bed<>chair transfer w/ supervision  PT Short Term Goal 4 (Week 2): Pt will maintain dynamic sitting balance w/ supervision  Skilled Therapeutic Interventions/Progress Updates:    Session 1: Pt sitting in w/c upon PT arrival, agreeable to therapy tx and denies pain. Pt propelled w/c from room<>gym x 100 ft each way with supervision and using L hemi technique. Pt transferred from w/c<>mat with min assist and verbal cues for set up/technique. Session focused on R LE neuromuscular re-ed. Pt transferred from sitting>supine>L sidelying with supervision and verbal cues for technique and R UE awareness. Pt performed active hip flexion and extension ROM using the powder board 2 x 10. Pt performed isometric glute and quad contraction with 3 second hold x 10. Pt performed straight leg hip flexion and extension x 10 with focus on maintaining quad contraction/knee extension throughout. Pt performed 1 x 10 SAQ with R LE using bolster under R knee. Pt performed 1 x 5 straight leg raise with R LE, focus on knee extension and pt can lift about 10 degrees without assist. Pt performed PNF D1 flexion with manual resistance x 10 and D2 extension 1 x 10 with manual resistance. Pt performed hip abduction AAROM in supine using maxislide for hip abductor activation 1 x 10. Pt transferred from w/c>recliner with supervision to the L, verbal cues for set up/technique. Pt left sitting in recliner with needs in reach.   Session 2: Pt sitting in w/c  upon PT arrival, agreeable to therapy tx and denies pain. Pt propelled w/c from room<>gym x 100 ft each way with supervision and using L hemi technique. Pt ambulated 1 x 10 ft and 1 x 7 ft with max assist to block R knee and help advance R LE, using the L handrail for UE support, w/c follow for safety.  Pt standing in staggered stance with R LE forward working on weightshifting anteriolateral over R LE facing the mat to use as a tactile cue to touch with R hip, x 2 trials with max assist for R knee blocking.  Pt transferred from w/c>bed with supervision, squat pivot transfer to the L. Pt left supine in bed with needs in reach.   Therapy Documentation Precautions:  Precautions Precautions: Fall Precaution Comments: left hemiparesis Restrictions Weight Bearing Restrictions: No   See Function Navigator for Current Functional Status.   Therapy/Group: Individual Therapy  Cresenciano GenreEmily van Schagen, PT, DPT 11/10/2016, 10:18 AM

## 2016-11-10 NOTE — Patient Care Conference (Signed)
Inpatient RehabilitationTeam Conference and Plan of Care Update Date: 11/10/2016   Time: 2:15 PM    Patient Name: Jordan Thompson      Medical Record Number: [id number]  Date of Birth: May 11, 1965 Sex: Male         Room/Bed: 4M13C/4M13C-01 Payor Info: Payor: MEDICAID PENDING / Plan: MEDICAID PENDING / Product Type: *No Product type* /    Admitting Diagnosis: rehab  Admit Date/Time:  11/01/2016  4:01 PM Admission Comments: No comment available   Primary Diagnosis:  <principal problem not specified> Principal Problem: <principal problem not specified>  Patient Active Problem List   Diagnosis Date Noted  . Hyponatremia   . Penile bleeding   . Right hemiparesis (HCC)   . 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 Adjustment disorder with mixed anxiety and depressed mood   . 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 Right hemiplegia (HCC)   . Vascular headache   . Hypokalemia   . Alcoholic cirrhosis (HCC)   . GERD without esophagitis   . Leukocytosis   . Acute blood loss anemia   . Benign essential HTN   . Hyperlipidemia   . History of CVA (cerebrovascular accident)   . Prediabetes   . Stroke (cerebrum) [facility](HCC) - acute right cerebellar PICA territory and left paramedian medulla infarcts 10/28/2016    Expected Discharge Date: Expected Discharge Date: 11/23/16  Team Members Present: Physician leading conference: Dr. Maryla MorrowAnkit Patel Social Worker Present: Dossie DerBecky Rosalene Wardrop, LCSW Nurse Present: Chrissie NoaMelanie Barnes, RN PT Present: Woodfin GanjaEmily Van Shagen, PT OT Present: Perrin MalteseJames McGuire, OT SLP Present: Jackalyn LombardNicole Page, SLP PPS Coordinator present : Edson SnowballBecky Windsor, PT     Current Status/Progress Goal Weekly Team Focus  Medical   Right hemiplegia and dysarthria secondary to left medullary infarct, bilateral cerebellar infarcts.   Improve mobility, transfers, cogntion, safety, hyponatremia, ABLA  See above    Bowel/Bladder   continent of bowel and bladder LBM 9-10, requires assist with urinal   Remain continent of bowel and bladder  Assist with toliet needs prn   Swallow/Nutrition/ Hydration             ADL's   Pt currently min assist for UB bathing with mod for LB. supervision for UB dressing with mod to max assist for LB dressing.  Max assist for stand pivot transfers and min to mod for squat pivot.  Trace shoulder movements noted in the LUE but needs max assist to integrate into therapy.    supervision for grooming and UB selfcare, min to mod assist for LB selfcare and min assist for transfers.  neuromuscular re-education, balance retraining, functional transfers, pt/family education, therapeutic exercise, selfcare retraining   Mobility   Min A squat pivot transfers, supervision w/c, Max A to maintain static standing w/ R knee block  Mod A gait, supervision transfers and w/c mobility   initiate gait, RLE NMR and quad activation   Communication             Safety/Cognition/ Behavioral Observations            Pain   pain managed with prn tylenol   pain < or =2  Assess pain q shift and prn   Skin   no skin issues   no new skin breakdown  Assess skin q shift and prn      *See Care Plan and progress notes for long and short-term goals.     Barriers to  Discharge  Current Status/Progress Possible Resolutions Date Resolved   Physician    Decreased caregiver support;Medical stability;Lack of/limited family support     See above  Therapies, follow labs, optimize cirrhosis meds      Nursing                  PT  Inaccessible home environment;Decreased caregiver support;Home environment access/layout;Lack of/limited family support                 OT                  SLP                SW                Discharge Planning/Teaching Needs:  HOme with wife and mother in-law providing care. Hopefully he will do better and not require so much physical care at DC. Will need to get mother  in-law in to see if she can do physical care.      Team Discussion:Progressing toward his goals-supervision transfer's and mod ambulation. Stopped DVT-due to bleeding issues. Knee buckles still in PT. Chronic issues are stable. Will need mother in-law to come in for education early in his stay.  Revisions to Treatment Plan:  DC 9/25    Continued Need for Acute Rehabilitation Level of Care: The patient requires daily medical management by a physician with specialized training in physical medicine and rehabilitation for the following conditions: Daily direction of a multidisciplinary physical rehabilitation program to ensure safe treatment while eliciting the highest outcome that is of practical value to the patient.: Yes Daily medical management of patient stability for increased activity during participation in an intensive rehabilitation regime.: Yes Daily analysis of laboratory values and/or radiology reports with any subsequent need for medication adjustment of medical intervention for : Neurological problems;Blood pressure problems;Other  Lucy Chris 11/10/2016, 3:35 PM

## 2016-11-10 NOTE — Consult Note (Signed)
Neuropsychological Consultation   Patient:   Jordan Thompson   DOB:   1965-08-27  MR Number:  [id number]  Location:  [facility] [address] [facility] [id number] [place] [address] Dept: [phone] Loc: [phone]           Date of Service:   11/10/2016  Start Time:   2 PM End Time:   3 PM  Provider/Observer:  Jordan Phenix, Psy.D.       Clinical Neuropsychologist       Billing Code/Service: [phone] 4 Units  Chief Complaint:    Jordan Thompson is a 51 year old male with recent dx of alcoholic cirrhosis admitted to [facility] for decompensation 08/18/2016.  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  10/26/2016 started symptoms on right side of body and was admitted on 10/27/2016 due to RUE/RLE weakness and numbness.  Transferred to [facility] for further workup.  MRI showed left medullary infarct.  Patient with resulting right side hemiparesis.  He has continued to deal with and adjust to loss of motor function on right side.  The patient is reporting good mood as of today.  Reason for Service:  Jordan Thompson was referred for neuropsychological consultation due to coping issues.  Cognitively, the patient appears to be doing well but near complete right side hemiparesis.  Up to very recently (07/2016) the patient was a heavy daily drinker, but has had no alcohol since June hospitalization for acute decompensation cirrhosis.  Below is th full HPI for the current admission.   HPI:  Jordan Thompson a 51 y.o.malewith pmh of hypertension, hypercholesterolemia, recent diagnosis of alcoholic cirrhosis ( admitted for decompensation 07/2016), L-PCA CVA 10/06/16--no residual deficits but requiring walker for ambulation. He was admitted via  [facility] on 10/27/16 with RUE/RLE weakness and numbness. History taken from chart review and patient. He was transferred to [facility] further  workup. Neurology was consulted and stroke workup initiated. MRI brain performed on 8/30 reviewed, showing left medullary infarct. Per report, acute infarct in left paramedian medullary, acute and subacute bilateral PICA territory, left subacute to chronic left medullary infarct. 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 Neurology recommended transition to DPT for 3 months and Plavix alone after stable on heparin. Patient with resulting right-sided weakness and mild dysarthria.  Cardiac echo with EF 60-65% with no wall abnormality. Hospital course complicated by leukocytosis, acute blood loss anemia. He used a rolling walker PTA. Lives with his wife who works during the day.   Current Status:  The patient continues to have good Mental Status, and reports that he is upbeat mood wise.  He is not craving alcohol and ready to work on improving motor control of right side of his body.  He does report that he is "scared" that he will not improve and not sure how he will go thru life with no use of his right leg or right arm.  The patient reports a dream involving alcohol happened last night and he woke up fearing that he had drank something.  Behavioral Observation: Jordan Thompson  presents as a 51 y.o.-year-old Right Caucasian Male who appeared his stated age. his dress was Appropriate and he was Well Groomed and his manners were Appropriate to the  situation.  his participation was indicative of Appropriate and Attentive behaviors.  There were physical disabilities noted.  he displayed an appropriate level of cooperation and motivation.     Interactions:    Active Appropriate and Attentive  Attention:   within normal limits and attention span and concentration  were age appropriate  Memory:   within normal limits; recent and remote memory intact  Visuo-spatial:  within normal limits  Speech (Volume):  normal  Speech:   normal; speech articulation improving.  Thought Process:  Coherent and Relevant  Though Content:  WNL;   Orientation:   person, place, time/date and situation  Judgment:   Fair  Planning:   Fair  Affect:    Anxious  Mood:    Anxious  Insight:   Good  Intelligence:   normal  Marital Status/Living: The patient is married and has supportive family.  Current Employment: Patient is not working  Substance Use:  There is a documented history of alcohol abuse confirmed by the patient and family members.  Patient reports drinking 5th of alcohol every day for years.   Medical History:   Past Medical History:  Diagnosis Date  . Cirrhosis of liver (HCC)   . CVA (cerebral vascular accident) (HCC)   . Hypercholesterolemia   . Hypertension        Psychiatric History:  Patient reports years of depression and feeling inadequate.  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  Wife earned all the money in the family.  Family Med/Psych History:  Family History  Problem Relation Age of Onset  . Hypertension Mother   . Cancer Mother   . Hypertension Father   . Parkinson's disease Father     Risk of Suicide/Violence: low patient does reports depression and anxiety but denies any current or active SI or HI.  Impression/DX:  Jordan Thompson is a 51 year old male with recent dx of alcoholic cirrhosis [facility] 08/18/2016.  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  10/26/2016 started symptoms on right side of body and was admitted on 10/27/2016 due to RUE/RLE weakness and numbness.  [facility]  MRI showed left medullary infarct.  Patient with resulting right side hemiparesis.  Jordan Thompson was  referred for neuropsychological consultation due to coping issues.  Cognitively, the patient appears to be doing well but near complete right side hemiparesis.  Up to very recently (07/2016) the patient was a heavy daily drinker, but has had no alcohol since June hospitalization for acute decompensation cirrhosis.  The patient reports that his mood has improved and he is happy and ready to continue working hard in rehab.  Disposition/Plan:  Will see the patient first of next week for follow-up.         Electronically Signed   _______________________ Jordan Thompson, Psy.D.

## 2016-11-10 NOTE — Progress Notes (Signed)
[facility] PHYSICAL MEDICINE & REHABILITATION     PROGRESS NOTE  Subjective/Complaints:  Pt seen sitting up in bed this AM.  He slept well overnight.  He feels he is getting stronger.    ROS: Denies CP, SOB, N/V/D.  Objective: Vital Signs: Blood pressure 131/85, pulse 81, temperature 98.9 F (37.2 C), temperature source Oral, resp. rate 16, height  (1.778 m), weight 95.1 kg (209 lb 9.6 oz), SpO2 96 %. No results found.  Recent Labs  11/09/16 0540  WBC 11.1*  HGB 11.5*  HCT 37.0*  PLT 250    Recent Labs  11/09/16 0540  NA 134*  K 3.7  CL 101  GLUCOSE 107*  BUN 8  CREATININE 0.71  CALCIUM 9.3   CBG (last 3)  No results for input(s): GLUCAP in the last 72 hours.  Wt Readings from Last 3 Encounters:  11/09/16 95.1 kg (209 lb 9.6 oz)  10/28/16 97.7 kg (215 lb 6.2 oz)    Physical Exam:  BP 131/85 (BP Location: Left Arm)   Pulse 81   Temp 98.9 F (37.2 C) (Oral)   Resp 16   Ht  (1.778 m)   Wt 95.1 kg (209 lb 9.6 oz)   SpO2 96%   BMI 30.07 kg/m  Constitutional: He appears well-developed and well-nourished. No distress.  HENT: Normocephalic and atraumatic.  Eyes: EOM are normal. No discharge.  Cardiovascular: RRR.  No JVD Respiratory: Effort normal and breath sounds normal. GI: Bowel sounds are normal. He exhibits no distension. Musculoskeletal: He exhibits no edema or tenderness.  Neurological: He is alert and oriented.  Dysphonia.  Right facial weakness.  Verbose.   RUE: 0/5 proximal to distal (unchanged) RLE: 3-/5 HF, KE, 0/5 ADF/PF (unchanged) LUE/LLE: 5/5 proximal to distal Skin: Skin is warm and dry. He is not diaphoretic.  Psychiatric: He has a normal mood and affect. His behavior is normal. Thought content normal.   Assessment/Plan: 1. 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 Physiatrist  is providing close team supervision and 24 hour management of active medical problems listed below. Physiatrist and rehab team continue to assess barriers to discharge/monitor patient progress toward functional and medical goals.  Function:  Bathing Bathing position   Position: Shower  Bathing parts Body parts bathed by patient: Right arm, Chest, Abdomen, Right upper leg, Left upper leg, Left lower leg Body parts bathed by helper: Left arm, Buttocks, Front perineal area, Right lower leg, Back  Bathing assist Assist Level:  (MOD A)      Upper Body Dressing/Undressing Upper body dressing   What is the patient wearing?: Pull over shirt/dress     Pull over shirt/dress - Perfomed by patient: Thread/unthread left sleeve, Put head through opening, Pull shirt over trunk, Thread/unthread right sleeve Pull over shirt/dress - Perfomed by helper: Thread/unthread right sleeve        Upper body assist Assist Level: Supervision or verbal cues      Lower Body Dressing/Undressing Lower body dressing   What is the patient wearing?: Pants, Socks, Shoes     Pants- Performed by patient: Thread/unthread left pants leg Pants- Performed by helper: Thread/unthread right pants leg, Pull pants up/down Non-skid slipper socks- Performed by patient: Don/doff right sock, Don/doff left sock Non-skid slipper socks- Performed by helper: Don/doff right sock, Don/doff left sock Socks - Performed by patient: Don/doff left sock Socks - Performed by  helper: Don/doff right sock Shoes - Performed by patient: Don/doff left shoe Shoes - Performed by helper: Fasten right, Fasten left, Don/doff right shoe          Lower body assist Assist for lower body dressing: Touching or steadying assistance (Pt > 75%)      Toileting Toileting   Toileting steps completed by patient: Adjust clothing prior to toileting, Adjust clothing after toileting Toileting steps completed by helper: Adjust clothing prior to toileting,  Performs perineal hygiene, Adjust clothing after toileting Toileting Assistive Devices: Toilet aid  Toileting assist Assist level: More than reasonable time, Set up/obtain supplies   Transfers Chair/bed transfer Chair/bed transfer activity did not occur: N/A Chair/bed transfer method: Squat pivot Chair/bed transfer assist level: Touching or steadying assistance (Pt > 75%) Chair/bed transfer assistive device: Armrests Mechanical lift: Stedy   Locomotion Ambulation Ambulation activity did not occur: Safety/medical concerns (R hemiplegia w/ 0/5 MMT globally)   Max distance: 10 ft Assist level: Maximal assist (Pt 25 - 49%) (2nd person for w/c follow)   Wheelchair   Type: Manual Max wheelchair distance: 150' Assist Level: Supervision or verbal cues  Cognition Comprehension Comprehension assist level: Follows complex conversation/direction with extra time/assistive device  Expression Expression assist level: Expresses complex ideas: With extra time/assistive device  Social Interaction Social Interaction assist level: Interacts appropriately with others with medication or extra time (anti-anxiety, antidepressant).  Problem Solving Problem solving assist level: Solves complex problems: Recognizes & self-corrects  Memory Memory assist level: Recognizes or recalls 90% of the time/requires cueing < 10% of the time    Medical Problem List and Plan: 1.  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   Cont CIR  WHO/PRAFO  2.  DVT Prophylaxis/Anticoagulation: Pharmaceutical: Lovenox d/ced due to bruising and penile bleeding 3. HA/Pain Management: tylenol prn for HA.  4. Mood: LCSW to follow for evaluation and support. Team to provide ego support.  5. Neuropsych: This patient is capable of making decisions on his own behalf. 6. Skin/Wound Care: routine pressure relief measures.  7. Fluids/Electrolytes/Nutrition: Monitor I/Os 8. HTN: Monitor  BP bid--continue lasix and aldactone.   Controlled 9/12  Cont to monitor 9. Cirrhosis of liver: [facility]. New diagnosis post decompensation. Resumed low salt restrictions with lasix and aldactone.   Decreased lactulose again on 9/11  Will continue to monitor for encephalopathy.  Monitor weights daily. 2 gram tylenol max/day 9. Leucocytosis: Monitor for signs of infection  WBCs 11.1 on 9/11 (stable)  Afebrile  Cont to monitor 10.  Prediabetes: Hgb A1c- 6.1.  Added CM restrictions. Consulted dietitian to reinforce education.   Relatively controlled 9/11 11. ABLA  Hb 11.5 on 9/11  Cont to monitor 12. Hyponatremia  Na+ 134 on 9/11  Cont to monitor  LOS (Days) 9 A FACE TO FACE EVALUATION WAS PERFORMED  Jordan Thompson Jordan Thompson Katheren Jimmerson 11/10/2016 7:49 AM

## 2016-11-11 ENCOUNTER — Inpatient Hospital Stay (HOSPITAL_COMMUNITY): Payer: Self-pay | Admitting: Physical Therapy

## 2016-11-11 ENCOUNTER — Inpatient Hospital Stay (HOSPITAL_COMMUNITY): Payer: Self-pay

## 2016-11-11 NOTE — Progress Notes (Signed)
Social Work Patient ID: Jordan Thompson, male   DOB: Oct 22, 1965, 51 y.o.   MRN: [id number]  Met with pt to discuss team conference goals supervision transfer's and mod assist with ambulation. Target discharge date is 9/25. He can see his progress it is not as fast as he would like but can see and feel it. He continues to try to be positive and pushes himself in therapies. Will continue to  Work on plans and needs.

## 2016-11-11 NOTE — Progress Notes (Signed)
Occupational Therapy Session Note  Patient Details  Name: Jordan Thompson MRN: [id number] Date of Birth: November 16, 1965  Today's Date: 11/11/2016 OT Individual Time: 1000-1057 OT Individual Time Calculation (min): 57 min    Short Term Goals: Week 2:  OT Short Term Goal 1 (Week 2): Pt will complete LB bathing with min assist sit to stand.  OT Short Term Goal 2 (Week 2): Pt will complete LB dressing sit to stand with mod assist.   OT Short Term Goal 3 (Week 2): Pt will complete toilet transfer with mod assist stand pivot. OT Short Term Goal 4 (Week 2): Pt will perform shower transfer with mod assist stand pivot to walk-in shower.  OT Short Term Goal 5 (Week 2): Pt will complete RUE AAROM exercises following handout with supervision.   Skilled Therapeutic Interventions/Progress Updates:    1:1. Pt requesting to bathe and dress this session. Pt sit to stand in stedy with supervision and VC for hip extension and weight shifting onto RLE. Pt bathes at sit to stand level with HOH A to wash LUE with RUE. Pt with increased activation at shoulder and elbow extensors this session. Pt sit to stand with MOD A for lifting and balance to wash buttocks with VC for RLE extension. Pt dresses seated EOB with supervision for donning shirt and touching A to bring BLE into seated figure 4. OT educates on energy conservation to thread pant, don sock and don shoe on LE before threading pants to other. Pt stand at sink with MOD A for balance and L knee blocking and encouragement to weight bear on RLE. Pt requesting to see options for showering pending pt  Mobility to climb stairs in house. Pt propels w/c using hemi technique to/from ADL apartment with supervision. OT educates on shower chair v TTB and grip strips in bottom of tub to decrease fall risk. Pt participates in discussion of bathroom set up and fall risk. Pt returned to room and transfers w/c>EOB with MOD A to R with VC for safety awareness and R foot positioning.  Exited session with pt semi reclined in bed with call light in reach and all needs met.  Therapy Documentation Precautions:  Precautions Precautions: Fall Precaution Comments: left hemiparesis Restrictions Weight Bearing Restrictions: No G See Function Navigator for Current Functional Status.   Therapy/Group: Individual Therapy  [name] 11/11/2016, 10:56 AM

## 2016-11-11 NOTE — Progress Notes (Signed)
[place] PHYSICAL MEDICINE & REHABILITATION     PROGRESS NOTE  Subjective/Complaints:  Patient seen sitting up in bed this morning. He slept well overnight. He is excited because of the improvement in strength.  ROS: Denies CP, SOB, N/V/D.  Objective: Vital Signs: Blood pressure 125/80, pulse 80, temperature 98.1 F (36.7 C), temperature source Oral, resp. rate 17, height  (1.778 m), weight 94.3 kg (208 lb), SpO2 96 %. No results found.  Recent Labs  11/09/16 0540  WBC 11.1*  HGB 11.5*  HCT 37.0*  PLT 250    Recent Labs  11/09/16 0540  NA 134*  K 3.7  CL 101  GLUCOSE 107*  BUN 8  CREATININE 0.71  CALCIUM 9.3   CBG (last 3)  No results for input(s): GLUCAP in the last 72 hours.  Wt Readings from Last 3 Encounters:  11/11/16 94.3 kg (208 lb)  10/28/16 97.7 kg (215 lb 6.2 oz)    Physical Exam:  BP 125/80 (BP Location: Left Arm)   Pulse 80   Temp 98.1 F (36.7 C) (Oral)   Resp 17   Ht  (1.778 m)   Wt 94.3 kg (208 lb)   SpO2 96%   BMI 29.84 kg/m  Constitutional: He appears well-developed and well-nourished. No distress.  HENT: Normocephalic and atraumatic.  Eyes: EOM are normal. No discharge.  Cardiovascular: RRR.  No JVD Respiratory: Effort normal and breath sounds normal. GI: Bowel sounds are normal. He exhibits no distension.  Musculoskeletal: He exhibits no edema or tenderness.  Neurological: He is alert and oriented.  Dysphonia Improving.  Right facial weakness.  Verbose.   RUE: Shoulder abduction 3-/5, elbow extension/flexion 3-/5, hand grip 0/5  RLE: 3/5 HF, KE, 0/5 ADF/PF  LUE/LLE: 5/5 proximal to distal Skin: Skin is warm and dry. He is not diaphoretic.  Psychiatric: He has a normal mood and affect. His behavior is normal. Thought content normal.   Assessment/Plan: 1. Functional deficits secondary to left medullary infarct, bilateral cerebellar infarcts which require 3+ hours per day of interdisciplinary therapy in a  comprehensive inpatient rehab setting. Physiatrist is providing close team supervision and 24 hour management of active medical problems listed below. Physiatrist and rehab team continue to assess barriers to discharge/monitor patient progress toward functional and medical goals.  Function:  Bathing Bathing position   Position: Wheelchair/chair at sink  Bathing parts Body parts bathed by patient: Right arm, Chest, Abdomen, Right upper leg, Left upper leg, Left lower leg Body parts bathed by helper: Right lower leg, Buttocks, Front perineal area, Left arm, Back  Bathing assist Assist Level:  (MOD A)      Upper Body Dressing/Undressing Upper body dressing   What is the patient wearing?: Pull over shirt/dress     Pull over shirt/dress - Perfomed by patient: Thread/unthread left sleeve, Put head through opening, Pull shirt over trunk, Thread/unthread right sleeve Pull over shirt/dress - Perfomed by helper: Thread/unthread right sleeve        Upper body assist Assist Level: Supervision or verbal cues      Lower Body Dressing/Undressing Lower body dressing   What is the patient wearing?: Pants, Socks, Shoes     Pants- Performed by patient: Thread/unthread left pants leg Pants- Performed by helper: Thread/unthread right pants leg, Pull pants up/down Non-skid slipper socks- Performed by patient: Don/doff right sock, Don/doff left sock Non-skid slipper socks- Performed by helper: Don/doff right sock, Don/doff left sock Socks - Performed by patient: Don/doff left sock Socks -  Performed by helper: Don/doff right sock Shoes - Performed by patient: Don/doff left shoe Shoes - Performed by helper: Fasten right, Fasten left, Don/doff right shoe          Lower body assist Assist for lower body dressing: Touching or steadying assistance (Pt > 75%)      Toileting Toileting   Toileting steps completed by patient: Performs perineal hygiene Toileting steps completed by helper: Adjust  clothing prior to toileting, Adjust clothing after toileting Toileting Assistive Devices: Grab bar or rail  Toileting assist Assist level: More than reasonable time, Set up/obtain supplies   Transfers Chair/bed transfer Chair/bed transfer activity did not occur: N/A Chair/bed transfer method: Squat pivot Chair/bed transfer assist level: Supervision or verbal cues Chair/bed transfer assistive device: Armrests Mechanical lift: Stedy   Locomotion Ambulation Ambulation activity did not occur: Safety/medical concerns (R hemiplegia w/ 0/5 MMT globally)   Max distance: 10 ft Assist level: 2 helpers   Wheelchair   Type: Manual Max wheelchair distance: 150' Assist Level: Supervision or verbal cues  Cognition Comprehension Comprehension assist level: Follows complex conversation/direction with extra time/assistive device  Expression Expression assist level: Expresses complex ideas: With extra time/assistive device  Social Interaction Social Interaction assist level: Interacts appropriately with others with medication or extra time (anti-anxiety, antidepressant).  Problem Solving Problem solving assist level: Solves complex problems: Recognizes & self-corrects  Memory Memory assist level: Recognizes or recalls 90% of the time/requires cueing < 10% of the time    Medical Problem List and Plan: 1.  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   Cont CIR  WHO/PRAFO   Strength continues to improve 2.  DVT Prophylaxis/Anticoagulation: Pharmaceutical: Lovenox d/ced due to bruising and penile bleeding 3. HA/Pain Management: Continue tylenol prn for HA.  4. Mood: LCSW to follow for evaluation and support. Team to provide ego support.  5. Neuropsych: This patient is capable of making decisions on his own behalf. 6. Skin/Wound Care: routine pressure relief measures.  7. Fluids/Electrolytes/Nutrition: Monitor I/Os 8. HTN: Monitor BP  bid--continue lasix and aldactone.   Controlled 9/13  Cont to monitor 9. Cirrhosis of liver: [facility]. New diagnosis post decompensation. Resumed low salt restrictions with lasix and aldactone.   Decreased lactulose again on 9/11  Will continue to monitor for encephalopathy.  Monitor weights daily. 2 gram tylenol max/day 9. Leucocytosis: Monitor for signs of infection  WBCs 11.1 on 9/11 (stable)  Afebrile  Cont to monitor 10.  Prediabetes: Hgb A1c- 6.1.  Added CM restrictions. Consulted dietitian to reinforce education.   Relatively controlled 9/11 11. ABLA  Hb 11.5 on 9/11  Cont to monitor 12. Hyponatremia  Na+ 134 on 9/11  Cont to monitor  LOS (Days) 10 A FACE TO FACE EVALUATION WAS PERFORMED  Ankit Karis Jubanil Patel 11/11/2016 8:26 AM

## 2016-11-11 NOTE — Progress Notes (Signed)
Physical Therapy Session Note  Patient Details  Name: Jordan Thompson MRN: [id number] Date of Birth: September 07, 1965  Today's Date: 11/11/2016 PT Individual Time: 7183-6725 PT Individual Time Calculation (min): 30 min   Short Term Goals: Week 2:  PT Short Term Goal 1 (Week 2): Pt will ambulate 25' Mod A w/ LRAD  PT Short Term Goal 2 (Week 2): Pt will maintain static standing balance w/ unilateral UE support and Mod A PT Short Term Goal 3 (Week 2): Pt will perform bed<>chair transfer w/ supervision  PT Short Term Goal 4 (Week 2): Pt will maintain dynamic sitting balance w/ supervision  Skilled Therapeutic Interventions/Progress Updates:    no c/o pain.  Session focus on NMR.    Pt completes sit<>stand throughout session with mod fade to min assist, forced weight bearing with LUE support on R knee, mod cues for forward gaze and weight shift.    In standing with L hemiwalker, NMR via weight shifting and unloading LLE x2, progress to marching LLE, progress to forward/retro stepping with LLE, progress to 3 steps forward/3 steps back with assist to advance/place RLE.  PT provided pt with GRAFO for improved RLE control in stance phase with much improvement.  Pt requires verbal cues for sequencing and upright posture, and tactile cues at R knee for quad activation, but mod assist overall for tasks.  Rest breaks provided as needed for fatigue.   Pt completes stand/pivot with hemiwalker at end of session with mod assist to position RLE.  Returned to recliner at end of session and positioned with call bell in reach and needs met.   Therapy Documentation Precautions:  Precautions Precautions: Fall Precaution Comments: left hemiparesis Restrictions Weight Bearing Restrictions: No   See Function Navigator for Current Functional Status.   Therapy/Group: Individual Therapy  Michel Santee 11/11/2016, 4:37 PM

## 2016-11-11 NOTE — Progress Notes (Signed)
Physical Therapy Session Note  Patient Details  Name: Jordan Thompson MRN: [id number] Date of Birth: 06-28-1965  Today's Date: 11/11/2016  PT Individual Time: 0805-0902 AND 1425-1502 PT Individual Time Calculation (min): 57 MIN and 37 min   Short Term Goals: Week 2:  PT Short Term Goal 1 (Week 2): Pt will ambulate 25' Mod A w/ LRAD  PT Short Term Goal 2 (Week 2): Pt will maintain static standing balance w/ unilateral UE support and Mod A PT Short Term Goal 3 (Week 2): Pt will perform bed<>chair transfer w/ supervision  PT Short Term Goal 4 (Week 2): Pt will maintain dynamic sitting balance w/ supervision  Skilled Therapeutic Interventions/Progress Updates:   Session 1:  Pt supine upon arrival and agreeable to therapy, no c/o pain. Worked on standing NMR and RLE quad activation while performing lateral weight shifts and stepping up to 1" step in parallel bars. Multiple sit<>stands w/ Min guard using L side of body to stand up, Mod-Max A block R knee for weight acceptance. Mod A to step up to 1" step w/ RLE and Max A to block L knee during step up. Pt self-propelled w/c back to room w/ supervision, 200'. Ended session in recliner, call bell within reach and all needs met.   Session 2:  Pt in w/c upon arrival and agreeable to therapy, no c/o pain. Worked on [employer] in seated this session w/ emphasis on weight-shifting to R side and R trunk control while passing weighted bean bags from side to side, including reaching outside of BOS in all directions. Verbal cues for technique and manual assist for RLE placement on ground. NuStep 5 min @ L2 for LE reciprocal movement pattern. Ended session in w/c in day room in care of staff for stroke support group, all needs met.   Therapy Documentation Precautions:  Precautions Precautions: Fall Precaution Comments: left hemiparesis Restrictions Weight Bearing Restrictions: No  See Function Navigator for Current Functional Status.   Therapy/Group:  Individual Therapy  Suman Trivedi K Arnette 11/11/2016, 3:03 PM

## 2016-11-12 ENCOUNTER — Inpatient Hospital Stay (HOSPITAL_COMMUNITY): Payer: Self-pay

## 2016-11-12 ENCOUNTER — Inpatient Hospital Stay (HOSPITAL_COMMUNITY): Payer: Self-pay | Admitting: Physical Therapy

## 2016-11-12 LAB — BASIC METABOLIC PANEL
Anion gap: 9 (ref 5–15)
BUN: 8 mg/dL (ref 6–20)
CALCIUM: 9.4 mg/dL (ref 8.9–10.3)
CHLORIDE: 101 mmol/L (ref 101–111)
CO2: 24 mmol/L (ref 22–32)
CREATININE: 0.71 mg/dL (ref 0.61–1.24)
GFR calc Af Amer: >60 mL/min (ref >60)
GFR calc non Af Amer: >60 mL/min (ref >60)
Glucose, Bld: 114 mg/dL — ABNORMAL HIGH (ref 65–99)
Potassium: 3.7 mmol/L (ref 3.5–5.1)
SODIUM: 134 mmol/L — AB (ref 135–145)

## 2016-11-12 LAB — CBC
HCT: 35.7 % — ABNORMAL LOW (ref 39.0–52.0)
Hemoglobin: 11.1 g/dL — ABNORMAL LOW (ref 13.0–17.0)
MCH: 26.2 pg (ref 26.0–34.0)
MCHC: 31.1 g/dL (ref 30.0–36.0)
MCV: 84.4 fL (ref 78.0–100.0)
PLATELETS: 252 10*3/uL (ref 150–400)
RBC: 4.23 MIL/uL (ref 4.22–5.81)
RDW: 18.6 % — AB (ref 11.5–15.5)
WBC: 9.7 10*3/uL (ref 4.0–10.5)

## 2016-11-12 NOTE — Progress Notes (Signed)
Physical Therapy Session Note  Patient Details  Name: Jordan Thompson MRN: [id number] Date of Birth: 1966-01-30  Today's Date: 11/12/2016 PT Individual Time: 8441-7127 PT Individual calculated time (min): 70 min  Short Term Goals: Week 2:  PT Short Term Goal 1 (Week 2): Pt will ambulate 25' Mod A w/ LRAD  PT Short Term Goal 2 (Week 2): Pt will maintain static standing balance w/ unilateral UE support and Mod A PT Short Term Goal 3 (Week 2): Pt will perform bed<>chair transfer w/ supervision  PT Short Term Goal 4 (Week 2): Pt will maintain dynamic sitting balance w/ supervision  Skilled Therapeutic Interventions/Progress Updates:   Pt supine upon arrival and agreeable to therapy, no c/o pain. Worked on R quad activation this session.   Attended NMES to R quad ms, 15 min, supine w/ SAQ motion:  -asym waveform -3 sec ramp  -10 sec on, 15 sec off -rate: 40 -400 pps -intensity @ 23  Performed multiple sit<>stands w/ Min A using hemiwalker w/ emphasis on RLE weight bearing and performed standing quad sets w/ tactile and manual cues for R quad activation. Seated rest breaks secondary to fatigue, performed LAQs in seated on R side during rests.   Provided home measurement sheet for pt to give to wife to make sure wheelchair for home is appropriate size.   Ended session in recliner, call bell within reach and all needs met.   Therapy Documentation Precautions:  Precautions Precautions: Fall Precaution Comments: left hemiparesis Restrictions Weight Bearing Restrictions: No  See Function Navigator for Current Functional Status.   Therapy/Group: Individual Therapy  Shanee Batch K Arnette 11/12/2016, 5:10 PM

## 2016-11-12 NOTE — Progress Notes (Signed)
Occupational Therapy Session Note  Patient Details  Name: Jordan Thompson MRN: [id number] Date of Birth: 1965-09-21  Today's Date: 11/12/2016 OT Individual Time: 1000-1100 OT Individual Time Calculation (min): 60 min    Skilled Therapeutic Interventions/Progress Updates:    1:1. No pain reported. Pt declines bathing at shower level. Pt propels w/c in room with supervision to sink with VC for hemi technique. Pt washes at sit to stand level in w/c with HOH A for washing LUE with RUE for NMR. Pt stands with VC to push off RLE to improve trunk lengthening and force RLE use. Pt washes buttocks with MOD A for standing balance and VC for weight shifting. Pt able to advance pants past hips today with MOD A for balance. Pt able to recall energy conservation technique of threading pant leg, donning sock and shoe on same leg before switching to next leg. Pt able to bring RLE up this date without A from OT to don socks/shoes. Pt stands in tx gym with MIN A for stedying balace and reaches laterally to obatin/throw a horse shoe. Pt requires VC/tactile cues to activate R quad and for posture. Exited session with pt seated in recliner with call light in reach and all needs met.   Therapy Documentation Precautions:  Precautions Precautions: Fall Precaution Comments: left hemiparesis Restrictions Weight Bearing Restrictions: No  See Function Navigator for Current Functional Status.   Therapy/Group: Individual Therapy  [name] 11/12/2016, 10:13 AM

## 2016-11-12 NOTE — Progress Notes (Signed)
[place] PHYSICAL MEDICINE & REHABILITATION     PROGRESS NOTE  Subjective/Complaints:  Pt seen laying in bed this AM.  He slept well overnight.  He is pleased with his progress, but wants to keep on improving.   ROS: Denies CP, SOB, N/V/D.  Objective: Vital Signs: Blood pressure 124/82, pulse 79, temperature 98.2 F (36.8 C), temperature source Oral, resp. rate 16, height  (1.778 m), weight 94.3 kg (208 lb), SpO2 96 %. No results found.  Recent Labs  11/12/16 0555  WBC 9.7  HGB 11.1*  HCT 35.7*  PLT 252    Recent Labs  11/12/16 0555  NA 134*  K 3.7  CL 101  GLUCOSE 114*  BUN 8  CREATININE 0.71  CALCIUM 9.4   CBG (last 3)  No results for input(s): GLUCAP in the last 72 hours.  Wt Readings from Last 3 Encounters:  11/11/16 94.3 kg (208 lb)  10/28/16 97.7 kg (215 lb 6.2 oz)    Physical Exam:  BP 124/82 (BP Location: Left Arm)   Pulse 79   Temp 98.2 F (36.8 C) (Oral)   Resp 16   Ht  (1.778 m)   Wt 94.3 kg (208 lb)   SpO2 96%   BMI 29.84 kg/m  Constitutional: He appears well-developed and well-nourished. No distress.  HENT: Normocephalic and atraumatic.  Eyes: EOM are normal. No discharge.  Cardiovascular: RRR.  No JVD Respiratory: Effort normal and breath sounds normal. GI: Bowel sounds are normal. He exhibits no distension.  Musculoskeletal: He exhibits no edema or tenderness.  Neurological: He is alert and oriented.  Dysphonia Improving.  Right facial weakness.  Verbose.   RUE: Shoulder abduction 3-/5, elbow extension/flexion 3-/5, hand grip 0/5 (Improving) RLE: 4/5 HF, KE, 3/5 ADF/PF  LUE/LLE: 5/5 proximal to distal Skin: Skin is warm and dry. He is not diaphoretic.  Psychiatric: He has a normal mood and affect. His behavior is normal. Thought content normal.   Assessment/Plan: 1. Functional deficits secondary to left medullary infarct, bilateral cerebellar infarcts which require 3+ hours per day of interdisciplinary therapy in a  comprehensive inpatient rehab setting. Physiatrist is providing close team supervision and 24 hour management of active medical problems listed below. Physiatrist and rehab team continue to assess barriers to discharge/monitor patient progress toward functional and medical goals.  Function:  Bathing Bathing position   Position: Shower  Bathing parts Body parts bathed by patient: Right arm, Chest, Abdomen, Right upper leg, Left upper leg, Left lower leg, Front perineal area, Buttocks, Right lower leg Body parts bathed by helper: Right lower leg, Buttocks, Front perineal area, Left arm, Back  Bathing assist Assist Level: Touching or steadying assistance(Pt > 75%)      Upper Body Dressing/Undressing Upper body dressing   What is the patient wearing?: Pull over shirt/dress     Pull over shirt/dress - Perfomed by patient: Thread/unthread left sleeve, Put head through opening, Pull shirt over trunk, Thread/unthread right sleeve Pull over shirt/dress - Perfomed by helper: Thread/unthread right sleeve        Upper body assist Assist Level: Supervision or verbal cues      Lower Body Dressing/Undressing Lower body dressing   What is the patient wearing?: Pants, Socks, Shoes     Pants- Performed by patient: Thread/unthread right pants leg, Thread/unthread left pants leg Pants- Performed by helper: Pull pants up/down Non-skid slipper socks- Performed by patient: Don/doff right sock, Don/doff left sock Non-skid slipper socks- Performed by helper: Don/doff right sock,  Don/doff left sock Socks - Performed by patient: Don/doff left sock Socks - Performed by helper: Don/doff right sock, Don/doff left sock Shoes - Performed by patient: Don/doff left shoe Shoes - Performed by helper: Fasten right, Fasten left, Don/doff right shoe, Don/doff left shoe          Lower body assist Assist for lower body dressing: Touching or steadying assistance (Pt > 75%)      Toileting Toileting   Toileting  steps completed by patient: Performs perineal hygiene Toileting steps completed by helper: Adjust clothing prior to toileting, Adjust clothing after toileting Toileting Assistive Devices: Grab bar or rail  Toileting assist Assist level: More than reasonable time, Set up/obtain supplies   Transfers Chair/bed transfer Chair/bed transfer activity did not occur: N/A Chair/bed transfer method: Squat pivot Chair/bed transfer assist level: Touching or steadying assistance (Pt > 75%) Chair/bed transfer assistive device: Armrests Mechanical lift: Stedy   Locomotion Ambulation Ambulation activity did not occur: Safety/medical concerns (R hemiplegia w/ 0/5 MMT globally)   Max distance: 10 ft Assist level: 2 helpers   Wheelchair   Type: Manual Max wheelchair distance: 200' Assist Level: Supervision or verbal cues  Cognition Comprehension Comprehension assist level: Follows complex conversation/direction with extra time/assistive device  Expression Expression assist level: Expresses complex ideas: With extra time/assistive device  Social Interaction Social Interaction assist level: Interacts appropriately with others with medication or extra time (anti-anxiety, antidepressant).  Problem Solving Problem solving assist level: Solves complex problems: Recognizes & self-corrects  Memory Memory assist level: Recognizes or recalls 90% of the time/requires cueing < 10% of the time    Medical Problem List and Plan: 1.  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   Cont CIR  WHO/PRAFO   Strength continues to improve 2.  DVT Prophylaxis/Anticoagulation: Pharmaceutical: Lovenox d/ced due to bruising and penile bleeding 3. HA/Pain Management: Continue tylenol prn for HA.  4. Mood: LCSW to follow for evaluation and support. Team to provide ego support.  5. Neuropsych: This patient is capable of making decisions on his own behalf. 6. Skin/Wound  Care: routine pressure relief measures.  7. Fluids/Electrolytes/Nutrition: Monitor I/Os 8. HTN: Monitor BP bid--continue lasix and aldactone.   Controlled 9/14  Cont to monitor 9. Cirrhosis of liver: has been set up with GI at [facility]. New diagnosis post decompensation. Resumed low salt restrictions with lasix and aldactone.   Decreased lactulose again on 9/11  Will continue to monitor for encephalopathy.  Monitor weights daily. 2 gram tylenol max/day 9. Leucocytosis: Resolved  Monitor for signs of infection  WBCs 9.7 on 9/14  Afebrile  Cont to monitor 10.  Prediabetes: Hgb A1c- 6.1.  Added CM restrictions. Consulted dietitian to reinforce education.   Relatively controlled 9/14 11. ABLA  Hb 11.1 on 9/14  Cont to monitor 12. Hyponatremia  Na+ 134 on 9/14  Cont to monitor  LOS (Days) 11 A FACE TO FACE EVALUATION WAS PERFORMED  Jordan Thompson 11/12/2016 9:02 AM

## 2016-11-12 NOTE — Progress Notes (Signed)
Occupational Therapy Session Note  Patient Details  Name: Jordan Thompson MRN: [id number] Date of Birth: 06-18-1965  Today's Date: 11/12/2016 OT Individual Time: 1505-1605 OT Individual Time Calculation (min): 60 min    Short Term Goals: Week 2:  OT Short Term Goal 1 (Week 2): Pt will complete LB bathing with min assist sit to stand.  OT Short Term Goal 2 (Week 2): Pt will complete LB dressing sit to stand with mod assist.   OT Short Term Goal 3 (Week 2): Pt will complete toilet transfer with mod assist stand pivot. OT Short Term Goal 4 (Week 2): Pt will perform shower transfer with mod assist stand pivot to walk-in shower.  OT Short Term Goal 5 (Week 2): Pt will complete RUE AAROM exercises following handout with supervision.   Skilled Therapeutic Interventions/Progress Updates:    1;1. Focus of session on standing balance, weight bearing through RUE, crossing midline and posture. Pt propels w/c to/from all tx destinaitons with hemi technique and supervision. Pt completes sit to stand throughout session with min-mod A with A for balance, VC for hand placement and weight shifting into RLE with facilitation at R knee to stay in extension. Pt weight bears through wrist while playing game of jenga reaching laterally to obatin pieces with tactile cues for trunk elongation. Pt stands at standing frame table with touching A weight bearing through R forearm on table, crossing midline to obtain game pieces with tactile cues for trunk rotation. Pt stands with MOD A for balance with RUE coban wrapped onto golf club to put golf balls. Pt  Demonstrates automatic improvement of posture when attempting to put as pt was an avid golf player prior to CVA. Exited session with pt semi reclined in bed with call light in reach and all needs met.  Therapy Documentation Precautions:  Precautions Precautions: Fall Precaution Comments: left hemiparesis Restrictions Weight Bearing Restrictions: No General:    Vital Signs: Therapy Vitals Temp: 98.3 F (36.8 C) Temp Source: Oral Pulse Rate: 86 Resp: 18 BP: 125/82 Patient Position (if appropriate): Sitting Oxygen Therapy SpO2: 98 % O2 Device: Not Delivered Pain:   ADL:   Vision   Perception    Praxis   Exercises:   Other Treatments:    See Function Navigator for Current Functional Status.   Therapy/Group: Individual Therapy  [name] 11/12/2016, 4:09 PM

## 2016-11-13 ENCOUNTER — Inpatient Hospital Stay (HOSPITAL_COMMUNITY): Payer: Medicaid Other | Admitting: Occupational Therapy

## 2016-11-13 NOTE — Progress Notes (Addendum)
[place] PHYSICAL MEDICINE & REHABILITATION     PROGRESS NOTE  Subjective/Complaints:   Feels like RUE and RLE getting stronger  ROS: Denies CP, SOB, N/V/D.  Objective: Vital Signs: Blood pressure 113/80, pulse 73, temperature 97.6 F (36.4 C), temperature source Oral, resp. rate 16, height  (1.778 m), weight 94.8 kg (209 lb), SpO2 98 %. No results found.  Recent Labs  11/12/16 0555  WBC 9.7  HGB 11.1*  HCT 35.7*  PLT 252    Recent Labs  11/12/16 0555  NA 134*  K 3.7  CL 101  GLUCOSE 114*  BUN 8  CREATININE 0.71  CALCIUM 9.4   CBG (last 3)  No results for input(s): GLUCAP in the last 72 hours.  Wt Readings from Last 3 Encounters:  11/13/16 94.8 kg (209 lb)  10/28/16 97.7 kg (215 lb 6.2 oz)    Physical Exam:  BP 113/80 (BP Location: Left Arm)   Pulse 73   Temp 97.6 F (36.4 C) (Oral)   Resp 16   Ht  (1.778 m)   Wt 94.8 kg (209 lb)   SpO2 98%   BMI 29.99 kg/m  Constitutional: He appears well-developed and well-nourished. No distress.  HENT: Normocephalic and atraumatic.  Eyes: EOM are normal. No discharge.  Cardiovascular: RRR.  No JVD Respiratory: Effort normal and breath sounds normal. GI: Bowel sounds are normal. He exhibits no distension.  Musculoskeletal: He exhibits no edema or tenderness.  Neurological: He is alert and oriented.  Dysphonia Improving.  Right facial weakness.  Verbose.   RUE: Shoulder abduction 3-/5, elbow extension/flexion 3-/5, hand grip 0/5 (Improving) RLE: 4/5 HF, KE, 3-/5 ADF/PF  LUE/LLE: 5/5 proximal to distal Skin: Skin is warm and dry. He is not diaphoretic.  Psychiatric: He has a normal mood and affect. His behavior is normal. Thought content normal.   Assessment/Plan: 1. 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 Physiatrist is providing close team  supervision and 24 hour management of active medical problems listed below. Physiatrist and rehab team continue to assess barriers to discharge/monitor patient progress toward functional and medical goals.  Function:  Bathing Bathing position   Position: Shower  Bathing parts Body parts bathed by patient: Right arm, Chest, Abdomen, Right upper leg, Left upper leg, Left lower leg, Front perineal area, Buttocks, Right lower leg Body parts bathed by helper: Right lower leg, Buttocks, Front perineal area, Left arm, Back  Bathing assist Assist Level: Touching or steadying assistance(Pt > 75%)      Upper Body Dressing/Undressing Upper body dressing   What is the patient wearing?: Pull over shirt/dress     Pull over shirt/dress - Perfomed by patient: Thread/unthread left sleeve, Put head through opening, Pull shirt over trunk, Thread/unthread right sleeve Pull over shirt/dress - Perfomed by helper: Thread/unthread right sleeve        Upper body assist Assist Level: Supervision or verbal cues      Lower Body Dressing/Undressing Lower body dressing   What is the patient wearing?: Pants, Socks, Shoes     Pants- Performed by patient: Thread/unthread right pants leg, Thread/unthread left pants leg Pants- Performed by helper: Pull pants up/down Non-skid slipper socks- Performed by patient: Don/doff right sock, Don/doff left sock Non-skid slipper socks- Performed by helper: Don/doff right sock, Don/doff left sock Socks - Performed by patient: Don/doff left sock Socks - Performed by helper: Don/doff right  sock, Don/doff left sock Shoes - Performed by patient: Don/doff left shoe Shoes - Performed by helper: Fasten right, Fasten left, Don/doff right shoe, Don/doff left shoe          Lower body assist Assist for lower body dressing: Touching or steadying assistance (Pt > 75%)      Toileting Toileting   Toileting steps completed by patient: Performs perineal hygiene Toileting steps  completed by helper: Adjust clothing prior to toileting, Adjust clothing after toileting Toileting Assistive Devices: Grab bar or rail  Toileting assist Assist level: More than reasonable time, Set up/obtain supplies   Transfers Chair/bed transfer Chair/bed transfer activity did not occur: N/A Chair/bed transfer method: Squat pivot Chair/bed transfer assist level: Supervision or verbal cues Chair/bed transfer assistive device: Armrests Mechanical lift: Stedy   Locomotion Ambulation Ambulation activity did not occur: Safety/medical concerns (R hemiplegia w/ 0/5 MMT globally)   Max distance: 10 ft Assist level: 2 helpers   Wheelchair   Type: Manual Max wheelchair distance: 200' Assist Level: Supervision or verbal cues  Cognition Comprehension Comprehension assist level: Follows complex conversation/direction with extra time/assistive device  Expression Expression assist level: Expresses complex ideas: With extra time/assistive device  Social Interaction Social Interaction assist level: Interacts appropriately with others with medication or extra time (anti-anxiety, antidepressant).  Problem Solving Problem solving assist level: Solves complex problems: Recognizes & self-corrects  Memory Memory assist level: Recognizes or recalls 90% of the time/requires cueing < 10% of the time    Medical Problem List and Plan: 1.  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   Cont CIR  WHO/PRAFO   Prox RUE and RLE return 2.  DVT Prophylaxis/Anticoagulation: Pharmaceutical: Lovenox d/ced due to bruising and penile bleeding 3. HA/Pain Management: Continue tylenol prn for HA.  4. Mood: LCSW to follow for evaluation and support. Team to provide ego support.  5. Neuropsych: This patient is capable of making decisions on his own behalf. 6. Skin/Wound Care: routine pressure relief measures.  7. Fluids/Electrolytes/Nutrition: Monitor I/Os 8. HTN:  Monitor BP bid--continue lasix and aldactone.   Controlled 9/15   Vitals:   11/12/16 1300 11/13/16 0500  BP: 125/82 113/80  Pulse: 86 73  Resp: 18 16  Temp: 98.3 F (36.8 C) 97.6 F (36.4 C)  SpO2: 98% 98%   9. Cirrhosis of liver: has been set up with GI at [facility]. New diagnosis post decompensation. Resumed low salt restrictions with lasix and aldactone.   Decreased lactulose again on 9/11, BM x 1 on 9/14  Will continue to monitor for encephalopathy.  Monitor weights daily. 2 gram tylenol max/day 9. Leucocytosis: Resolved  Monitor for signs of infection  WBCs 9.7 on 9/14  Afebrile  Cont to monitor 10.  Prediabetes: Hgb A1c- 6.1. No CBG needed       11. ABLA  Hb 11.1 on 9/14  Cont to monitor 12. Hyponatremia  Na+ 134 on 9/14  Cont to monitor  LOS (Days) 12 A FACE TO FACE EVALUATION WAS PERFORMED  Khloei Spiker E 11/13/2016 9:15 AM

## 2016-11-13 NOTE — Progress Notes (Signed)
Occupational Therapy Session Note  Patient Details  Name: Jordan Thompson MRN: [id number] Date of Birth: 1965/09/17  Today's Date: 11/13/2016 OT Individual Time: 9509-3267 OT Individual Time Calculation (min): 30 min    Short Term Goals: Week 2:  OT Short Term Goal 1 (Week 2): Pt will complete LB bathing with min assist sit to stand.  OT Short Term Goal 2 (Week 2): Pt will complete LB dressing sit to stand with mod assist.   OT Short Term Goal 3 (Week 2): Pt will complete toilet transfer with mod assist stand pivot. OT Short Term Goal 4 (Week 2): Pt will perform shower transfer with mod assist stand pivot to walk-in shower.  OT Short Term Goal 5 (Week 2): Pt will complete RUE AAROM exercises following handout with supervision.   Skilled Therapeutic Interventions/Progress Updates:    Pt seen for short ADL session to facilitate trunk control, balance, functional mobility. Pt received in bed and worked on R tricep facilitation with hand drop exercise. Tricep did activate to maintain his elbow in partial extension. Worked on  fully rolling to R and rotating L shoulder over trunk for an efficient sidelying to sit.  Stand pivot to w/c.  Several sit to stands at sink for LB bathing and dressing with steadying A and cues to fully extend through RLE.  RUE wt bearing on sink in standing with wt shifts to achieve midline in standing. Pt completed dressing and rested in w/c with all needs met.  Therapy Documentation Precautions:  Precautions Precautions: Fall Precaution Comments: left hemiparesis Restrictions Weight Bearing Restrictions: No     Pain:  no c/o pain    See Function Navigator for Current Functional Status.   Therapy/Group: Individual Therapy  [place] 11/13/2016, 12:55 PM

## 2016-11-14 ENCOUNTER — Inpatient Hospital Stay (HOSPITAL_COMMUNITY): Payer: Self-pay

## 2016-11-14 MED ORDER — MUSCLE RUB 10-15 % EX CREA
TOPICAL_CREAM | CUTANEOUS | Status: DC | PRN
  Administered 2016-11-14: 11:00:00 via TOPICAL
  Filled 2016-11-14: qty 85

## 2016-11-14 NOTE — Progress Notes (Signed)
Occupational Therapy Session Note  Patient Details  Name: Jordan Thompson MRN: [id number] Date of Birth: [birth year]  Today's Date: 11/14/2016 OT Individual Time: 1002-1100 OT Individual Time Calculation (min): 58 min    Short Term Goals: Week 1:  OT Short Term Goal 1 (Week 1): Pt will perform toilet transfer with mod A in order to decrease level of assistance with self care.  OT Short Term Goal 1 - Progress (Week 1): Met OT Short Term Goal 2 (Week 1): Pt will engaged in dynamic sitting balance task for 5 minutes with min A for balance. OT Short Term Goal 2 - Progress (Week 1): Met OT Short Term Goal 3 (Week 1): Pt will perform UB dressing with min A with modifications as needed in order to increase I in self care. OT Short Term Goal 3 - Progress (Week 1): Met  Skilled Therapeutic Interventions/Progress Updates:    1:1. Pt squat pivot transfer using grab bar with touching A w/c<>TTB in walk in shower with VC for safety awareness and LLE placement throughout transfer. Pt bathes at sit to stand level with MIN A for balance and facilitation of R knee extension as pt washes buttocks. Pt requires HOH A of RUE to wash LUE for NMR. Pt dresses at sit to stand level in w/c at sink with A for balance as written above. Pt dons pant leg, sock and shoe in seated figure 4 for energy conservation prior to switching legs. Pt sit to stand at sink to brush teeth with MIN A and facilitation of RLE into extension. Pt transfer as stated above to recliner and OT stretches RLE to decrease tightness and risk for contracture. Exited session with pt seated in recliner with call light in reach and all needs met.  Therapy Documentation Precautions:  Precautions Precautions: Fall Precaution Comments: left hemiparesis Restrictions Weight Bearing Restrictions: No  See Function Navigator for Current Functional Status.   Therapy/Group: Individual Therapy  [name] 11/14/2016, 10:57 AM

## 2016-11-14 NOTE — Progress Notes (Signed)
Jordan Thompson PHYSICAL MEDICINE & REHABILITATION     PROGRESS NOTE  Subjective/Complaints:   Left upper abd pain, no pain at rest only when he does a leg lift.  Has been performing leg lifts repetitvely yesterday  ROS: Denies CP, SOB, N/V/D.  Objective: Vital Signs: Blood pressure 124/79, pulse 99, temperature 98.6 F (37 C), temperature source Oral, resp. rate 16, height  (1.778 m), weight 95.4 kg (210 lb 4.8 oz), SpO2 94 %. No results found.  Recent Labs  11/12/16 0555  WBC 9.7  HGB 11.1*  HCT 35.7*  PLT 252    Recent Labs  11/12/16 0555  NA 134*  K 3.7  CL 101  GLUCOSE 114*  BUN 8  CREATININE 0.71  CALCIUM 9.4   CBG (last 3)  No results for input(s): GLUCAP in the last 72 hours.  Wt Readings from Last 3 Encounters:  11/14/16 95.4 kg (210 lb 4.8 oz)  10/28/16 97.7 kg (215 lb 6.2 oz)    Physical Exam:  BP 124/79 (BP Location: Left Arm)   Pulse 99   Temp 98.6 F (37 C) (Oral)   Resp 16   Ht  (1.778 m)   Wt 95.4 kg (210 lb 4.8 oz)   SpO2 94%   BMI 30.17 kg/m  Constitutional: He appears well-developed and well-nourished. No distress.  HENT: Normocephalic and atraumatic.  Eyes: EOM are normal. No discharge.  Cardiovascular: RRR.  No JVD Respiratory: Effort normal and breath sounds normal. GI: Bowel sounds are normal. He exhibits no distension.  Musculoskeletal: He exhibits no edema , tender at left subcostal margin rectus origin Neurological: He is alert and oriented.  Dysphonia Improving.  Right facial weakness.  Verbose.   RUE: Shoulder abduction 3-/5, elbow extension/flexion 3-/5, hand grip 1/5 (Improving) RLE: 4/5 HF, KE, 3-/5 ADF/PF  LUE/LLE: 5/5 proximal to distal Skin: Skin is warm and dry. He is not diaphoretic.  Psychiatric: He has a normal mood and affect. His behavior is normal. Thought content normal.   Assessment/Plan: 1. 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 Physiatrist is providing close team supervision and 24 hour management of active medical problems listed below. Physiatrist and rehab team continue to assess barriers to discharge/monitor patient progress toward functional and medical goals.  Function:  Bathing Bathing position   Position: Shower  Bathing parts Body parts bathed by patient: Right arm, Chest, Abdomen, Right upper leg, Left upper leg, Left lower leg, Front perineal area, Buttocks, Right lower leg Body parts bathed by helper: Right lower leg, Buttocks, Front perineal area, Left arm, Back  Bathing assist Assist Level: Touching or steadying assistance(Pt > 75%)      Upper Body Dressing/Undressing Upper body dressing   What is the patient wearing?: Pull over shirt/dress     Pull over shirt/dress - Perfomed by patient: Thread/unthread left sleeve, Put head through opening, Pull shirt over trunk, Thread/unthread right sleeve Pull over shirt/dress - Perfomed by helper: Thread/unthread right sleeve        Upper body assist Assist Level: Supervision or verbal cues      Lower Body Dressing/Undressing Lower body dressing   What is the patient wearing?: Pants, Socks, Shoes     Pants- Performed by patient: Thread/unthread right pants leg, Thread/unthread left pants leg Pants- Performed by helper: Pull pants up/down Non-skid slipper socks- Performed by patient: Don/doff right sock, Don/doff left sock  Non-skid slipper socks- Performed by helper: Don/doff right sock, Don/doff left sock Socks - Performed by patient: Don/doff left sock Socks - Performed by helper: Don/doff right sock, Don/doff left sock Shoes - Performed by patient: Don/doff left shoe Shoes - Performed by helper: Fasten right, Fasten left, Don/doff right shoe, Don/doff left shoe          Lower body assist Assist for lower body dressing: Touching or steadying assistance (Pt  > 75%)      Toileting Toileting   Toileting steps completed by patient: Performs perineal hygiene Toileting steps completed by helper: Adjust clothing prior to toileting, Adjust clothing after toileting Toileting Assistive Devices: Grab bar or rail  Toileting assist Assist level: More than reasonable time, Set up/obtain supplies   Transfers Chair/bed transfer Chair/bed transfer activity did not occur: N/A Chair/bed transfer method: Squat pivot Chair/bed transfer assist level: Supervision or verbal cues Chair/bed transfer assistive device: Armrests Mechanical lift: Stedy   Locomotion Ambulation Ambulation activity did not occur: Safety/medical concerns (R hemiplegia w/ 0/5 MMT globally)   Max distance: 10 ft Assist level: 2 helpers   Wheelchair   Type: Manual Max wheelchair distance: 200' Assist Level: Supervision or verbal cues  Cognition Comprehension Comprehension assist level: Follows complex conversation/direction with extra time/assistive device  Expression Expression assist level: Expresses complex ideas: With extra time/assistive device  Social Interaction Social Interaction assist level: Interacts appropriately with others with medication or extra time (anti-anxiety, antidepressant).  Problem Solving Problem solving assist level: Solves complex problems: Recognizes & self-corrects  Memory Memory assist level: Recognizes or recalls 90% of the time/requires cueing < 10% of the time    Medical Problem List and Plan: 1.  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   Cont CIR  WHO/PRAFO   Prox RUE and RLE return-pt with concerns about return to home setting 2.  DVT Prophylaxis/Anticoagulation: Pharmaceutical: Lovenox d/ced due to bruising and penile bleeding 3. HA/Pain Management: Continue tylenol prn for HA. Abd pain is muscle strain of rectus abdominus- add bengay 4. Mood: LCSW to follow for evaluation and support.  Team to provide ego support.  5. Neuropsych: This patient is capable of making decisions on his own behalf. 6. Skin/Wound Care: routine pressure relief measures.  7. Fluids/Electrolytes/Nutrition: Monitor I/Os 8. HTN: Monitor BP bid--continue lasix and aldactone.   Controlled 9/16   Vitals:   11/13/16 0500 11/14/16 0537  BP: 113/80 124/79  Pulse: 73 99  Resp: 16 16  Temp: 97.6 F (36.4 C) 98.6 F (37 C)  SpO2: 98% 94%   9. Cirrhosis of liver: has been set up with GI at [facility]. New diagnosis post decompensation. Resumed low salt restrictions with lasix and aldactone.   Decreased lactulose again on 9/11, BM x 1 on 9/14  Will continue to monitor for encephalopathy.  Monitor weights daily. 2 gram tylenol max/day 9. Leucocytosis: Resolved  Monitor for signs of infection  WBCs 9.7 on 9/14  Afebrile  Cont to monitor 10.  Prediabetes: Hgb A1c- 6.1. No CBG needed       11. ABLA  Hb 11.1 on 9/14  Cont to monitor 12. Hyponatremia  Na+ 134 on 9/14  Cont to monitor  LOS (Days) 13 A FACE TO FACE EVALUATION WAS PERFORMED  Erick Colace 11/14/2016 8:36 AM

## 2016-11-15 ENCOUNTER — Inpatient Hospital Stay (HOSPITAL_COMMUNITY): Payer: Self-pay

## 2016-11-15 ENCOUNTER — Inpatient Hospital Stay (HOSPITAL_COMMUNITY): Payer: Self-pay | Admitting: Occupational Therapy

## 2016-11-15 ENCOUNTER — Inpatient Hospital Stay (HOSPITAL_COMMUNITY): Payer: Medicaid Other | Admitting: Occupational Therapy

## 2016-11-15 ENCOUNTER — Inpatient Hospital Stay (HOSPITAL_COMMUNITY): Payer: Medicaid Other

## 2016-11-15 NOTE — Progress Notes (Signed)
[place] PHYSICAL MEDICINE & REHABILITATION     PROGRESS NOTE  Subjective/Complaints:  Pt seen sitting up in bed this AM.  He slept well overnight and had a good weekend.   ROS: Denies CP, SOB, N/V/D.  Objective: Vital Signs: Blood pressure 122/81, pulse 82, temperature 98 F (36.7 C), temperature source Oral, resp. rate 16, height  (1.778 m), weight 96.3 kg (212 lb 4.8 oz), SpO2 97 %. No results found. No results for input(s): WBC, HGB, HCT, PLT in the last 72 hours. No results for input(s): NA, K, CL, GLUCOSE, BUN, CREATININE, CALCIUM in the last 72 hours.  Invalid input(s): CO CBG (last 3)  No results for input(s): GLUCAP in the last 72 hours.  Wt Readings from Last 3 Encounters:  11/15/16 96.3 kg (212 lb 4.8 oz)  10/28/16 97.7 kg (215 lb 6.2 oz)    Physical Exam:  BP 122/81 (BP Location: Left Arm)   Pulse 82   Temp 98 F (36.7 C) (Oral)   Resp 16   Ht  (1.778 m)   Wt 96.3 kg (212 lb 4.8 oz)   SpO2 97%   BMI 30.46 kg/m  Constitutional: He appears well-developed and well-nourished. No distress.  HENT: Normocephalic and atraumatic.  Eyes: EOM are normal. No discharge.  Cardiovascular: RRR.  No JVD Respiratory: Effort normal and breath sounds normal. GI: Bowel sounds are normal. He exhibits no distension.  Musculoskeletal: He exhibits no edema, no tenderness Neurological: He is alert and oriented.  Dysphonia Improving.  Right facial weakness.  Verbose.   RUE: Shoulder abduction 3-/5, elbow extension/flexion 3-/5, hand grip 1/5 (stable) RLE: 4/5 HF, KE, 3-/5 ADF/PF (stable) LUE/LLE: 5/5 proximal to distal Skin: Skin is warm and dry. He is not diaphoretic.  Psychiatric: He has a normal mood and affect. His behavior is normal. Thought content normal.   Assessment/Plan: 1. Functional deficits secondary to left medullary infarct, bilateral cerebellar infarcts which require 3+ hours per day of interdisciplinary therapy in a comprehensive inpatient rehab  setting. Physiatrist is providing close team supervision and 24 hour management of active medical problems listed below. Physiatrist and rehab team continue to assess barriers to discharge/monitor patient progress toward functional and medical goals.  Function:  Bathing Bathing position   Position: Shower  Bathing parts Body parts bathed by patient: Right arm, Chest, Abdomen, Right upper leg, Left upper leg, Left lower leg, Front perineal area, Buttocks, Right lower leg Body parts bathed by helper: Right lower leg, Buttocks, Front perineal area, Left arm, Back  Bathing assist Assist Level: Touching or steadying assistance(Pt > 75%)      Upper Body Dressing/Undressing Upper body dressing   What is the patient wearing?: Pull over shirt/dress     Pull over shirt/dress - Perfomed by patient: Thread/unthread left sleeve, Put head through opening, Pull shirt over trunk, Thread/unthread right sleeve Pull over shirt/dress - Perfomed by helper: Thread/unthread right sleeve        Upper body assist Assist Level: Supervision or verbal cues      Lower Body Dressing/Undressing Lower body dressing   What is the patient wearing?: Pants, Socks, Shoes     Pants- Performed by patient: Thread/unthread right pants leg, Thread/unthread left pants leg, Pull pants up/down Pants- Performed by helper: Pull pants up/down Non-skid slipper socks- Performed by patient: Don/doff right sock, Don/doff left sock Non-skid slipper socks- Performed by helper: Don/doff right sock, Don/doff left sock Socks - Performed by patient: Don/doff right sock, Don/doff left sock Socks -  Performed by helper: Don/doff right sock, Don/doff left sock Shoes - Performed by patient: Don/doff right shoe, Don/doff left shoe, Fasten right, Fasten left Shoes - Performed by helper: Fasten right, Fasten left, Don/doff right shoe, Don/doff left shoe          Lower body assist Assist for lower body dressing: Touching or steadying  assistance (Pt > 75%)      Toileting Toileting   Toileting steps completed by patient: Performs perineal hygiene Toileting steps completed by helper: Adjust clothing prior to toileting, Adjust clothing after toileting Toileting Assistive Devices: Grab bar or rail  Toileting assist Assist level: More than reasonable time, Set up/obtain supplies   Transfers Chair/bed transfer Chair/bed transfer activity did not occur: N/A Chair/bed transfer method: Squat pivot Chair/bed transfer assist level: Supervision or verbal cues Chair/bed transfer assistive device: Armrests Mechanical lift: Stedy   Locomotion Ambulation Ambulation activity did not occur: Safety/medical concerns (R hemiplegia w/ 0/5 MMT globally)   Max distance: 10 ft Assist level: 2 helpers   Wheelchair   Type: Manual Max wheelchair distance: 200' Assist Level: Supervision or verbal cues  Cognition Comprehension Comprehension assist level: Follows complex conversation/direction with extra time/assistive device  Expression Expression assist level: Expresses complex ideas: With extra time/assistive device  Social Interaction Social Interaction assist level: Interacts appropriately with others with medication or extra time (anti-anxiety, antidepressant).  Problem Solving Problem solving assist level: Solves complex problems: Recognizes & self-corrects  Memory Memory assist level: Recognizes or recalls 90% of the time/requires cueing < 10% of the time    Medical Problem List and Plan: 1.  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   Cont CIR, making progress  WHO/PRAFO  2.  DVT Prophylaxis/Anticoagulation: Pharmaceutical: Lovenox d/ced due to bruising and penile bleeding 3. HA/Pain Management: Continue tylenol prn for HA. Abd pain is muscle strain of rectus abdominus- added bengay 4. Mood: LCSW to follow for evaluation and support. Team to provide ego support.  5.  Neuropsych: This patient is capable of making decisions on his own behalf. 6. Skin/Wound Care: routine pressure relief measures.  7. Fluids/Electrolytes/Nutrition: Monitor I/Os 8. HTN: Monitor BP bid--continue lasix and aldactone.   Controlled 9/17   Vitals:   11/14/16 1400 11/15/16 0500  BP: 119/73 122/81  Pulse: 90 82  Resp: 20 16  Temp: 98.4 F (36.9 C) 98 F (36.7 C)  SpO2: 99% 97%   9. Cirrhosis of liver: has been set up with GI at [facility]. New diagnosis post decompensation. Resumed low salt restrictions with lasix and aldactone.   Decreased lactulose again on 9/11  Will continue to monitor for encephalopathy.  Monitor weights daily. 2 gram tylenol max/day 9. Leucocytosis: Resolved  Monitor for signs of infection  WBCs 9.7 on 9/14  Afebrile  Cont to monitor 10.  Prediabetes: Hgb A1c- 6.1.   No CBG needed 11. ABLA  Hb 11.1 on 9/14  Cont to monitor 12. Hyponatremia  Na+ 134 on 9/14  Cont to monitor  LOS (Days) 14 A FACE TO FACE EVALUATION WAS PERFORMED  Jordan Thompson Jordan Thompson 11/15/2016 8:55 AM

## 2016-11-15 NOTE — Progress Notes (Signed)
Physical Therapy Session Note  Patient Details  Name: Jordan Thompson MRN: [id number] Date of Birth: 27-Oct-1965  Today's Date: 11/15/2016 PT Individual Time: 0930-1000 PT Individual Time Calculation (min): 30 min   Short Term Goals: Week 2:  PT Short Term Goal 1 (Week 2): Pt will ambulate 25' Mod A w/ LRAD  PT Short Term Goal 2 (Week 2): Pt will maintain static standing balance w/ unilateral UE support and Mod A PT Short Term Goal 3 (Week 2): Pt will perform bed<>chair transfer w/ supervision  PT Short Term Goal 4 (Week 2): Pt will maintain dynamic sitting balance w/ supervision  Skilled Therapeutic Interventions/Progress Updates:    Pt with need to urinate so performed functional transfers via Stedy to toilet with focus on forced weightshift onto RLE during sit <> stands and during standing balance for clothing management (min to mod assist (from low surface) for sit <> stands with facilitation to weightshift over RLE. Squat pivot transfers w/c <> mat with focus on foot placement and normalizing movement pattern as pt tendency to just use L side instead of activating through R. Assisted pt with donning R AFO and educated on importance of skin checks and observation. Pt verbalized understanding. NMR for sit <> stands with hemiwalker with facilitation at R quad for activation and upright trunk posture and increasing weightshift onto RLE. Handoff to primary PT.  Therapy Documentation Precautions:  Precautions Precautions: Fall Precaution Comments: R hemi Restrictions Weight Bearing Restrictions: No  Pain:  Denies pain.    See Function Navigator for Current Functional Status.   Therapy/Group: Individual Therapy  Karolee Stamps Darrol Poke, PT, DPT  11/15/2016, 10:18 AM

## 2016-11-15 NOTE — Progress Notes (Signed)
Physical Therapy Session Note  Patient Details  Name: Jordan Thompson MRN: [id number] Date of Birth: 1965/12/28  Today's Date: 11/15/2016 PT Individual Time: 1000-1100, 1615-1701 PT Individual Time Calculation (min): 60 min and 46 min   Short Term Goals: Week 2:  PT Short Term Goal 1 (Week 2): Pt will ambulate 25' Mod A w/ LRAD  PT Short Term Goal 2 (Week 2): Pt will maintain static standing balance w/ unilateral UE support and Mod A PT Short Term Goal 3 (Week 2): Pt will perform bed<>chair transfer w/ supervision  PT Short Term Goal 4 (Week 2): Pt will maintain dynamic sitting balance w/ supervision  Skilled Therapeutic Interventions/Progress Updates:    Session 1: Pt received from PT in the gym, seated edge of mat and agreeable to therapy tx, denies pain. Pt wearing R ground reaction AFO this session during functional standing activities. Pt standing working on R lateral weightshifts with isometric quad contraction x 10, tactile and verbal cues. Pt performed 1 x 10 sit to stands without UE support with focus on symmetric weightbearing through LEs, mod assist and facilitation for R LE weightbearing. Working on pregait activities using the [employer] for L UE support, pt performed forward/backward stepping with R LE x 10, focus on foot placement to prevent scissoring; pt performed forward/backward stepping with L LE x 10, focus on R LE weightshift and maintaining hip/knee extension during stance with tactile and verbal cues. Pt ambulated x 25 ft using L handrail, max assist for R hip/knee extension during stance and assist to place R LE during swing phase. Pt transferred from w/c to recliner at end of session with supervision, left seated in w/c with needs in reach.   Session 2: Pt supine in bed upon PT arrival, agreeable to therapy tx and denies pain. Pt transferred supine to sitting EOB with supervision and use of bedrails. Pt transferred from bed<>w/c with supervision and verbal cues for w/c  management/set up. Pt Working on pregait activities using the [employer] for L UE support, pt performed forward/backward stepping with R LE x 10, focus on foot placement to prevent scissoring; pt performed forward/backward stepping with L LE x 10, focus on R LE weightshift and maintaining hip/knee extension during stance with tactile and verbal cues- increased carry over from this morning with pt demonstrating improved hip and knee extension. Pt practiced stand pivot transfers x 2 from w/c<>bed and w/c>recliner with min to mod assist. Pt left seated in recliner at end of session with needs in reach.   Therapy Documentation Precautions:  Precautions Precautions: Fall Precaution Comments: R hemi Restrictions Weight Bearing Restrictions: No   See Function Navigator for Current Functional Status.   Therapy/Group: Individual Therapy  Cresenciano Genre , PT, DPT 11/15/2016, 11:01 AM

## 2016-11-15 NOTE — Progress Notes (Signed)
Occupational Therapy Weekly Progress Note  Patient Details  Name: Jordan Thompson MRN: [id number] Date of Birth: 1965-11-17  Beginning of progress report period: November 09, 2016 End of progress report period: November 15, 2016  Today's Date: 11/15/2016 OT Individual Time: 9518-8416 OT Individual Time Calculation (min): 58 min    Patient has met 4 of 5 short term goals.  He continues to make steady progress with OT at this time.  He can complete squat pivot transfers to the left with min assist and mod assist to the right.  Mod assist needed for sit to stand at the sink during LB selfcare with mod assist for stand pivot transfers to the left and max assist to the right.  He still exhibits decreased strength and activation in the RLE, requiring at least mod assist to support it when weightbearing.  He is continuing to make some gains in RUE functional movement.  He demonstrates activation of the shoulder flexors and elbow flexors but not the wrist or digits at this time.  He has been tolerating NMES to the digits as well as hand over hand max assist to integrate it into bathing tasks when attempting to wash the LUE.  He is able to cross his RLE over his left knee for dressing tasks and maintain with close supervision this week which is an improvement from last week.  Feel he is on target for min assist level goals or better.  Will continue with current OT POC to address these.   Patient continues to demonstrate the following deficits: muscle weakness, impaired timing and sequencing, unbalanced muscle activation, decreased coordination and decreased motor planning and decreased standing balance, decreased postural control, hemiplegia and decreased balance strategies and therefore will continue to benefit from skilled OT intervention to enhance overall performance with BADL and Reduce care partner burden.  Patient progressing toward long term goals..  Continue plan of care.  OT Short Term  Goals Week 3:  OT Short Term Goal 1 (Week 3): Pt will complete LB bathing with min assist sit to stand.  OT Short Term Goal 2 (Week 3): Pt wil complete LB dressing sit to stand with min assist.  OT Short Term Goal 3 (Week 3): Pt will complete toilet transfers stand pivot with min assist.  OT Short Term Goal 4 (Week 3): Pt will complete shower transfers stand pivot with min assist.   OT Short Term Goal 5 (Week 3): Pt will use the RUE as a stabilizer for selfcare tasks with mod assist.   Skilled Therapeutic Interventions/Progress Updates:    Pt began session with transfer stand pivot from the bed to the wheelchair with min assist, to the right.  Max assist for transfer to the toilet stand pivot to the right for toileting tasks with mod assist for standing balance to manage clothing.  Once completed he was able to stand pivot back to the left to the wheelchair with mod assist.  Proceeded over to the sink with the wheelchair for bathing and dressing, as he reported taking a shower yesterday and only wanting to sponge bathe today.  He needed min assist for UB bathing with mod assist for LB bathing sit to stand.  He completed donning LB clothing with only min assist to thread items over his feet but mod assist for standing to pull them up over his hips.  Grooming tasks completed in wheelchair as well with supervision.  Issued denture brush at end of session for pt to try cleaning his partial  plate with next visit.  Pt left up in wheelchair with call button and phone in reach.    Therapy Documentation Precautions:  Precautions Precautions: Fall Precaution Comments: R hemi Restrictions Weight Bearing Restrictions: No   Pain: Pain Assessment Pain Assessment: No/denies pain ADL: See Function Navigator for Current Functional Status.   Therapy/Group: Individual Therapy  Liba Hulsey OTR/L 11/15/2016, 12:23 PM

## 2016-11-16 ENCOUNTER — Inpatient Hospital Stay (HOSPITAL_COMMUNITY): Payer: Self-pay | Admitting: Physical Therapy

## 2016-11-16 ENCOUNTER — Inpatient Hospital Stay (HOSPITAL_COMMUNITY): Payer: Self-pay

## 2016-11-16 ENCOUNTER — Inpatient Hospital Stay (HOSPITAL_COMMUNITY): Payer: Medicaid Other | Admitting: Occupational Therapy

## 2016-11-16 NOTE — Progress Notes (Signed)
[place] PHYSICAL MEDICINE & REHABILITATION     PROGRESS NOTE  Subjective/Complaints:  Pt seen sitting up in his chair this AM.  He slept well overnight. He wants his right side to get stronger.   ROS: Denies CP, SOB, N/V/D.  Objective: Vital Signs: Blood pressure 112/73, pulse 80, temperature 98.8 F (37.1 C), temperature source Oral, resp. rate 18, height  (1.778 m), weight 96.6 kg (213 lb), SpO2 97 %. No results found. No results for input(s): WBC, HGB, HCT, PLT in the last 72 hours. No results for input(s): NA, K, CL, GLUCOSE, BUN, CREATININE, CALCIUM in the last 72 hours.  Invalid input(s): CO CBG (last 3)  No results for input(s): GLUCAP in the last 72 hours.  Wt Readings from Last 3 Encounters:  11/16/16 96.6 kg (213 lb)  10/28/16 97.7 kg (215 lb 6.2 oz)    Physical Exam:  BP 112/73 (BP Location: Left Arm)   Pulse 80   Temp 98.8 F (37.1 C) (Oral)   Resp 18   Ht  (1.778 m)   Wt 96.6 kg (213 lb)   SpO2 97%   BMI 30.56 kg/m  Constitutional: He appears well-developed and well-nourished. No distress.  HENT: Normocephalic and atraumatic.  Eyes: EOM are normal. No discharge.  Cardiovascular: RRR.  No JVD Respiratory: Effort normal and breath sounds normal. GI: Bowel sounds are normal. He exhibits no distension.  Musculoskeletal: He exhibits no edema, no tenderness Neurological: He is alert and oriented.  Dysphonia Improving.  Right facial weakness.  Verbose.   RUE: Shoulder abduction 3-/5, elbow extension/flexion 3-/5, hand grip 1/5 (improving) RLE: 4/5 HF, KE, 3-/5 ADF/PF (stable) LUE/LLE: 5/5 proximal to distal Skin: Skin is warm and dry. He is not diaphoretic.  Psychiatric: He has a normal mood and affect. His behavior is normal. Thought content normal.   Assessment/Plan: 1. Functional deficits secondary to left medullary infarct, bilateral cerebellar infarcts which require 3+ hours per day of interdisciplinary therapy in a comprehensive  inpatient rehab setting. Physiatrist is providing close team supervision and 24 hour management of active medical problems listed below. Physiatrist and rehab team continue to assess barriers to discharge/monitor patient progress toward functional and medical goals.  Function:  Bathing Bathing position   Position: Wheelchair/chair at sink  Bathing parts Body parts bathed by patient: Right arm, Chest, Abdomen, Right upper leg, Left upper leg, Left lower leg, Front perineal area, Buttocks, Right lower leg Body parts bathed by helper: Buttocks, Front perineal area, Left arm, Back  Bathing assist Assist Level: Touching or steadying assistance(Pt > 75%)      Upper Body Dressing/Undressing Upper body dressing   What is the patient wearing?: Pull over shirt/dress     Pull over shirt/dress - Perfomed by patient: Thread/unthread left sleeve, Put head through opening, Pull shirt over trunk, Thread/unthread right sleeve Pull over shirt/dress - Perfomed by helper: Thread/unthread right sleeve        Upper body assist Assist Level: Supervision or verbal cues      Lower Body Dressing/Undressing Lower body dressing   What is the patient wearing?: Pants, Socks, Shoes     Pants- Performed by patient: Thread/unthread right pants leg, Thread/unthread left pants leg Pants- Performed by helper: Pull pants up/down Non-skid slipper socks- Performed by patient: Don/doff right sock, Don/doff left sock Non-skid slipper socks- Performed by helper: Don/doff right sock, Don/doff left sock Socks - Performed by patient: Don/doff right sock, Don/doff left sock Socks - Performed by helper: Don/doff right  sock, Don/doff left sock Shoes - Performed by patient: Don/doff right shoe, Don/doff left shoe, Fasten right, Fasten left Shoes - Performed by helper: Fasten right, Fasten left, Don/doff right shoe, Don/doff left shoe          Lower body assist Assist for lower body dressing: Touching or steadying  assistance (Pt > 75%)      Toileting Toileting   Toileting steps completed by patient: Performs perineal hygiene Toileting steps completed by helper: Adjust clothing prior to toileting, Adjust clothing after toileting Toileting Assistive Devices: Grab bar or rail  Toileting assist Assist level: Touching or steadying assistance (Pt.75%)   Transfers Chair/bed transfer Chair/bed transfer activity did not occur: N/A Chair/bed transfer method: Stand pivot Chair/bed transfer assist level: Touching or steadying assistance (Pt > 75%) Chair/bed transfer assistive device: Armrests Mechanical lift: Stedy   Locomotion Ambulation Ambulation activity did not occur: Safety/medical concerns (R hemiplegia w/ 0/5 MMT globally)   Max distance: 25 ft Assist level: Maximal assist (Pt 25 - 49%) (w/c follow for safety)   Wheelchair   Type: Manual Max wheelchair distance: 200' Assist Level: Supervision or verbal cues  Cognition Comprehension Comprehension assist level: Follows complex conversation/direction with extra time/assistive device  Expression Expression assist level: Expresses complex ideas: With extra time/assistive device  Social Interaction Social Interaction assist level: Interacts appropriately with others with medication or extra time (anti-anxiety, antidepressant).  Problem Solving Problem solving assist level: Solves complex problems: Recognizes & self-corrects  Memory Memory assist level: Recognizes or recalls 90% of the time/requires cueing < 10% of the time    Medical Problem List and Plan: 1.  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   Cont CIR  WHO/PRAFO  2.  DVT Prophylaxis/Anticoagulation: Pharmaceutical: Lovenox d/ced due to bruising and penile bleeding 3. HA/Pain Management: Continue tylenol prn for HA. Abd pain is muscle strain of rectus abdominus- added bengay 4. Mood: LCSW to follow for evaluation and support.  Team to provide ego support.  5. Neuropsych: This patient is capable of making decisions on his own behalf. 6. Skin/Wound Care: routine pressure relief measures.  7. Fluids/Electrolytes/Nutrition: Monitor I/Os 8. HTN: Monitor BP bid--continue lasix and aldactone.   Controlled 9/18   Vitals:   11/15/16 1439 11/16/16 0538  BP: 122/78 112/73  Pulse: 81 80  Resp: 17 18  Temp: 98.3 F (36.8 C) 98.8 F (37.1 C)  SpO2: 98% 97%   9. Cirrhosis of liver: has been set up with GI at [facility]. New diagnosis post decompensation. Resumed low salt restrictions with lasix and aldactone.   Decreased lactulose again on 9/11, will consider d/cing  Will continue to monitor for encephalopathy.  Monitor weights daily. 2 gram tylenol max/day 9. Leucocytosis: Resolved  Monitor for signs of infection  WBCs 9.7 on 9/14  Afebrile  Cont to monitor 10.  Prediabetes: Hgb A1c- 6.1.   No CBG needed 11. ABLA  Hb 11.1 on 9/14  Cont to monitor 12. Hyponatremia  Na+ 134 on 9/14  Cont to monitor  LOS (Days) 15 A FACE TO FACE EVALUATION WAS PERFORMED  Ankit Karis Juba 11/16/2016 8:37 AM

## 2016-11-16 NOTE — Progress Notes (Signed)
Physical Therapy Session Note  Patient Details  Name: Jordan Thompson MRN: [id number] Date of Birth: 09/16/1965  Today's Date: 11/16/2016 PT Individual Time: 1305-1415 PT Individual Time Calculation (min): 70 min   Short Term Goals: Week 3:  PT Short Term Goal 1 (Week 3): STG=LTG due to ELOS  Skilled Therapeutic Interventions/Progress Updates:   Pt in w/c upon arrival anda agreeable to therapy, no c/o pain. Worked on D/C planning and functional mobility this session. Pt's wife faxed home measurement sheet today. Highlights include 33" doorway w/ flat entry and 30" kitchen door way. Practiced performing transfer similar to home set-up and what pt would be required to do in order to use toilet and go w/c to/from bed or couch. Pt performed stand-pivot transfers w/ Min guard to supervision and frequent verbal cues for technique and sequencing. Ambulated w/ hemiwalker w/ Min A for balance and multimodal cues for gait pattern, 10' and 5' x2.  Negotiated around cones w/ w/c to mimic home environment. Self-propelled w/c back to room. Ended session supine in bed, call bell within reach and all needs met.   Therapy Documentation Precautions:  Precautions Precautions: Fall Precaution Comments: R hemi Restrictions Weight Bearing Restrictions: No  See Function Navigator for Current Functional Status.   Therapy/Group: Individual Therapy  Amy K Arnette 11/16/2016, 2:59 PM  

## 2016-11-16 NOTE — Progress Notes (Signed)
Occupational Therapy Session Note  Patient Details  Name: Jordan Thompson MRN: [id number] Date of Birth: 1965-06-02  Today's Date: 11/16/2016 OT Individual Time: 4098-1191 OT Individual Time Calculation (min): 64 min    Short Term Goals: Week 3:  OT Short Term Goal 1 (Week 3): Pt will complete LB bathing with min assist sit to stand.  OT Short Term Goal 2 (Week 3): Pt wil complete LB dressing sit to stand with min assist.  OT Short Term Goal 3 (Week 3): Pt will complete toilet transfers stand pivot with min assist.  OT Short Term Goal 4 (Week 3): Pt will complete shower transfers stand pivot with min assist.   OT Short Term Goal 5 (Week 3): Pt will use the RUE as a stabilizer for selfcare tasks with mod assist.   Skilled Therapeutic Interventions/Progress Updates:    Pt transferred from wheelchair to tub bench with max assist stand pivot to the right.  He completed bathing sit to stand with mod assist, max assist hand over hand for integration of the RUE into bathing task.  Still with decreased ability to maintain left knee extension in standing, requiring mod assist to maintain along with standing balance.  He was able to transfer back out to the wheelchair with mod assist to the left in order to transition to the sink for dressing tasks.  Min assist for donning LB clothing with mod assist for standing balance to pull his shorts over his hips.  Max assist for donning the right AFO and shoe.  Supervision with mod instructional cueing this session for donning pullover shirt.  He then was able to manage his grooming tasks of brushing his hair and oral care with supervision.  Finished session with pt sitting in wheelchair with call button and phone in reach and RUE supported on half lap tray.    Therapy Documentation Precautions:  Precautions Precautions: Fall Precaution Comments: R hemi Restrictions Weight Bearing Restrictions: No   Pain: Pain Assessment Pain Assessment: No/denies  pain ADL:  See Function Navigator for Current Functional Status.   Therapy/Group: Individual Therapy  Aldridge Krzyzanowski OTR/L 11/16/2016, 10:19 AM

## 2016-11-16 NOTE — Progress Notes (Signed)
Physical Therapy Weekly Progress Note  Patient Details  Name: Jordan Thompson MRN: [id number] Date of Birth: 06/21/1965  Beginning of progress report period: November 09, 2016 End of progress report period: November 16, 2016  Today's Date: 11/16/2016 PT Individual Time: 1000-1100 PT Individual Time Calculation (min): 60 min   Patient has met 4 of 4 short term goals.  Pt is progressing well with functional mobility and R sided strength/muscular control. Pt continues to be limited during standing/gait activities secondary to impaired balance and decreased R knee/hip control. Pt is able to transfer and perform bed mobility with supervision, requiring cues for techniques and sequencing, as well as cues to attend to R side.   Patient continues to demonstrate the following deficits muscle weakness, impaired timing and sequencing, abnormal tone, unbalanced muscle activation and decreased coordination, decreased attention to right and decreased standing balance, decreased postural control, hemiplegia and decreased balance strategies and therefore will continue to benefit from skilled PT intervention to increase functional independence with mobility.  Patient progressing toward long term goals..  Continue plan of care.  PT Short Term Goals Week 2:  PT Short Term Goal 1 (Week 2): Pt will ambulate 25' Mod A w/ LRAD  PT Short Term Goal 1 - Progress (Week 2): Met PT Short Term Goal 2 (Week 2): Pt will maintain static standing balance w/ unilateral UE support and Mod A PT Short Term Goal 2 - Progress (Week 2): Met PT Short Term Goal 3 (Week 2): Pt will perform bed<>chair transfer w/ supervision  PT Short Term Goal 3 - Progress (Week 2): Met PT Short Term Goal 4 (Week 2): Pt will maintain dynamic sitting balance w/ supervision PT Short Term Goal 4 - Progress (Week 2): Met Week 3:  PT Short Term Goal 1 (Week 3): STG=LTG due to ELOS  Skilled Therapeutic Interventions/Progress Updates:    Pt sitting  in w/c upon PT arrival, agreeable to therapy tx and denies pain. Pt propelled w/c to the gym x 100 ft with supervision. Pt ambulated x 28 ft using L handrail for UE support and R AFO, assist to advance/place R LE and verbal/tactile cues for hip/knee extension during stance. Pt performed x 4 stand pivot transfers with focus on technique and sequencing, using the hemiwalker for UE support. Pt transferred sitting<>supine with verbal cues for technique. In supine pt performed 2 x 10 single leg bridges with R LE for NMR. Pt performed 1 x 10 R hip abduction in hooklying with manual resistance for NMR. Pt performed PNF D2 extension pattern contract relax for R LE NMR x 10. Pt performed 1 x 5 sit to stands without UE support with min assist, focus on stabilizing in standing without assist and R hip/knee control. Pt propelled w/c back to room, left seated in w/c with needs in reach.   Therapy Documentation Precautions:  Precautions Precautions: Fall Precaution Comments: R hemi Restrictions Weight Bearing Restrictions: No   See Function Navigator for Current Functional Status.  Therapy/Group: Individual Therapy  Emily van Schagen, PT, DPT 11/16/2016, 7:55 AM   

## 2016-11-17 ENCOUNTER — Inpatient Hospital Stay (HOSPITAL_COMMUNITY): Payer: Medicaid Other | Admitting: Occupational Therapy

## 2016-11-17 ENCOUNTER — Inpatient Hospital Stay (HOSPITAL_COMMUNITY): Payer: Self-pay

## 2016-11-17 NOTE — Progress Notes (Signed)
Occupational Therapy Session Note  Patient Details  Name: Jordan Thompson MRN: [id number] Date of Birth: 20-Jun-1965  Today's Date: 11/17/2016 OT Individual Time: 0800-0902 OT Individual Time Calculation (min): 62 min    Short Term Goals: Week 3:  OT Short Term Goal 1 (Week 3): Pt will complete LB bathing with min assist sit to stand.  OT Short Term Goal 2 (Week 3): Pt wil complete LB dressing sit to stand with min assist.  OT Short Term Goal 3 (Week 3): Pt will complete toilet transfers stand pivot with min assist.  OT Short Term Goal 4 (Week 3): Pt will complete shower transfers stand pivot with min assist.   OT Short Term Goal 5 (Week 3): Pt will use the RUE as a stabilizer for selfcare tasks with mod assist.   Skilled Therapeutic Interventions/Progress Updates:    Pt completed sponge bath at the sink sit to stand per his choice.  Min assist for sit to stand from the wheelchair to remove shorts.  Mod assist needed for standing balance to wash peri area and pull shorts back up over his hips.  Still with increased knee flexion and decreased strength on the right side in standing, requiring mod assist to maintain balance during static standing.  He needed min assist for keeping the RLE crossed over the left knee when working on bathing and dressing tasks as well.  Supervision for donning pullover shirt with min assist to thread all clothing over his feet.  He was able to donn his right and left shoe but needed mod assist on the right secondary to AFO.  Max hand over hand assist still needed for integration of the RUE as an active assist for bathing tasks.  Educated pt on the need to attempt more use as a stabilizer for holding washcloth or toothpaste when opening.  Pt left in wheelchair with call button in reach and phone as well.  Encouraged continued work on AROM using the half lap tray for support, with simple movements of shoulder flexion and internal/external rotation.    Therapy  Documentation Precautions:  Precautions Precautions: Fall Precaution Comments: R hemi Restrictions Weight Bearing Restrictions: No  Pain: Pain Assessment Pain Assessment: No/denies pain ADL:  See Function Navigator for Current Functional Status.   Therapy/Group: Individual Therapy  Jeniel Slauson OTR/L 11/17/2016, 9:24 AM

## 2016-11-17 NOTE — Progress Notes (Signed)
Physical Therapy Session Note  Patient Details  Name: Jordan Thompson MRN: [id number] Date of Birth: [birth year]  Today's Date: 11/17/2016 PT Individual Time: 1000-1100, 1530-1615 PT Individual Time Calculation (min): 60 min , 45 MIN  Short Term Goals: Week 3:  PT Short Term Goal 1 (Week 3): STG=LTG due to ELOS  Skilled Therapeutic Interventions/Progress Updates:    Session 1: Pt using the bathroom upon PT arrival, agreeable to therapy tx. Pt transferred from toilet to w/c using stedy. Pt propelled w/c x 100 ft to the gym with supervision. PT transferred from w/c<>mat, squat pivot transfers with supervision, pt managing all w/c parts without assist as well. Pt performed forward/backward stepping 2 x 8 with L LE to target focusing on R stance phase with weightshift over R LE maintaining hip and knee extension. Pt ambulated x 28 ft and 25 ft using the L handrail with mod assist to help advance and stabilize R LE, tactile cues for R hip and knee extension, pt with improved balance and gait speed this session. Pt ambulated x 15 ft using the hemi walker and max assist, verbal cues for sequencing, tactile cues for R hip/knee extension and R LE advancement. Pt transferred from w/c to recliner, stand pivot using hemi walker and min assist. Pt left seated in recliner with needs in reach.   Session 2: Pt sitting in w/c upon PT arrival, agreeable to therapy tx and denies pain. Pt propelled w/c to the gym with supervision. Session focused on transfers and ambulation with trial of RW with hand splint. Pt performed stand step transfer from w/c<>mat with RW and mod assist, verbal cues for sequencing and technique. Pt ambulated x 15 ft, x 22 ft and x 25 ft using RW with hand splint, verbal cues for sequencing and RW management, tactile cues for R knee extension during stance phase. Pt with improved quad activation and gait speed during last trial. Pt transferred from w/c>recliner, squat pivot with supervision. Pt  left seated in recliner with needs in reach.   Therapy Documentation Precautions:  Precautions Precautions: Fall Precaution Comments: R hemi Restrictions Weight Bearing Restrictions: No   See Function Navigator for Current Functional Status.   Therapy/Group: Individual Therapy  Cresenciano Genre, PT, DPT 11/17/2016, 10:24 AM

## 2016-11-17 NOTE — Progress Notes (Signed)
[facility]ICAL [facility]IC[facility] & REHABILITATION     PROGRESS NOTE  Subjective/Complaints:  Pt seen sitting up in his chair this AM.  He wants his right side to get stronger.  Prolonged discussion regarding discharge needs.   ROS: Denies CP, SOB, N/V/D.  Objective: Vital Signs: Blood pressure 107/75, pulse 80, temperature 98.2 F (36.8 C), temperature source Oral, resp. rate 12, height  (1.778 m), weight 95.5 kg (210 lb 8.6 oz), SpO2 95 %. No results found. No results for input(s): WBC, HGB, HCT, PLT in the last 72 hours. No results for input(s): NA, K, CL, GLUCOSE, BUN, CREATININE, CALCIUM in the last 72 hours.  Invalid input(s): CO CBG (last 3)  No results for input(s): GLUCAP in the last 72 hours.  Wt Readings from Last 3 Encounters:  11/17/16 95.5 kg (210 lb 8.6 oz)  10/28/16 97.7 kg (215 lb 6.2 oz)    Physical Exam:  BP 107/75 (BP Location: Left Arm)   Pulse 80   Temp 98.2 F (36.8 C) (Oral)   Resp 12   Ht  (1.778 m)   Wt 95.5 kg (210 lb 8.6 oz)   SpO2 95%   BMI 30.21 kg/m  Constitutional: He appears well-developed and well-nourished. No distress.  HENT: Normocephalic and atraumatic.  Eyes: EOM are normal. No discharge.  Cardiovascular: RRR.  No JVD Respiratory: Effort normal and breath sounds normal. GI: Bowel sounds are normal. He exhibits no distension.  Musculoskeletal: He exhibits no edema, no tenderness Neurological: He is alert and oriented.  Dysphonia Improving.  Right facial weakness.  Verbose.   RUE: Shoulder abduction 3-/5, elbow extension/flexion 3-/5, hand grip 1/5 (slowly improving) RLE: 4/5 HF, KE, 3-/5 ADF/PF (stable) LUE/LLE: 5/5 proximal to distal Skin: Skin is warm and dry. He is not diaphoretic.  Psychiatric: He has a normal mood and affect. His behavior is normal. Thought content normal.   Assessment/Plan: 1. Functional deficits secondary to left medullary infarct, bilateral cerebellar infarcts which require 3+ hours per day of  interdisciplinary therapy in a comprehensive inpatient rehab setting. Physiatrist is providing close team supervision and 24 hour management of active medical problems listed below. Physiatrist and rehab team continue to assess barriers to discharge/monitor patient progress toward functional and medical goals.  Function:  Bathing Bathing position   Position: Wheelchair/chair at sink  Bathing parts Body parts bathed by patient: Right arm, Chest, Abdomen, Right upper leg, Left upper leg, Left lower leg, Right lower leg, Front perineal area Body parts bathed by helper: Left arm, Back, Buttocks  Bathing assist Assist Level: Touching or steadying assistance(Pt > 75%)      Upper Body Dressing/Undressing Upper body dressing   What is the patient wearing?: Pull over shirt/dress     Pull over shirt/dress - Perfomed by patient: Thread/unthread left sleeve, Put head through opening, Pull shirt over trunk, Thread/unthread right sleeve Pull over shirt/dress - Perfomed by helper: Thread/unthread right sleeve        Upper body assist Assist Level: Supervision or verbal cues      Lower Body Dressing/Undressing Lower body dressing   What is the patient wearing?: Pants, Socks, Shoes, AFO     Pants- Performed by patient: Thread/unthread right pants leg, Thread/unthread left pants leg Pants- Performed by helper: Pull pants up/down Non-skid slipper socks- Performed by patient: Don/doff right sock, Don/doff left sock Non-skid slipper socks- Performed by helper: Don/doff right sock, Don/doff left sock Socks - Performed by patient: Don/doff right sock, Don/doff left sock Socks -  Performed by helper: Don/doff right sock, Don/doff left sock Shoes - Performed by patient: Don/doff left shoe Shoes - Performed by helper: Don/doff right shoe   AFO - Performed by helper: Don/doff right AFO      Lower body assist Assist for lower body dressing: Touching or steadying assistance (Pt > 75%)       Toileting Toileting   Toileting steps completed by patient: Performs perineal hygiene Toileting steps completed by helper: Adjust clothing prior to toileting, Adjust clothing after toileting Toileting Assistive Devices: Grab bar or rail  Toileting assist Assist level: Touching or steadying assistance (Pt.75%)   Transfers Chair/bed transfer Chair/bed transfer activity did not occur: N/A Chair/bed transfer method: Stand pivot Chair/bed transfer assist level: Supervision or verbal cues Chair/bed transfer assistive device: Armrests, Walker (hemiwalker) Mechanical lift: Landscape architect Ambulation activity did not occur: Safety/medical concerns (R hemiplegia w/ 0/5 MMT globally)   Max distance: 10' Assist level: Touching or steadying assistance (Pt > 75%)   Wheelchair   Type: Manual Max wheelchair distance: 150' Assist Level: Supervision or verbal cues  Cognition Comprehension Comprehension assist level: Follows complex conversation/direction with extra time/assistive device  Expression Expression assist level: Expresses complex ideas: With extra time/assistive device  Social Interaction Social Interaction assist level: Interacts appropriately with others with medication or extra time (anti-anxiety, antidepressant).  Problem Solving Problem solving assist level: Solves complex problems: Recognizes & self-corrects  Memory Memory assist level: Recognizes or recalls 90% of the time/requires cueing < 10% of the time    Medical Problem List and Plan: 1.  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   Cont CIR, continues to make functional gains, team conference today  WHO/PRAFO  2.  DVT Prophylaxis/Anticoagulation: Pharmaceutical: Lovenox d/ced due to bruising and penile bleeding 3. HA/Pain Management: Continue tylenol prn for HA.  4. Mood: LCSW to follow for evaluation and support. Team to provide ego support.  5.  Neuropsych: This patient is capable of making decisions on his own behalf. 6. Skin/Wound Care: routine pressure relief measures.  7. Fluids/Electrolytes/Nutrition: Monitor I/Os 8. HTN: Monitor BP bid--continue lasix and aldactone.   Controlled 9/19 Vitals:   11/16/16 1423 11/17/16 0630  BP: 118/88 107/75  Pulse: 88 80  Resp: 18 12  Temp: 98.1 F (36.7 C) 98.2 F (36.8 C)  SpO2: 98% 95%   9. Cirrhosis of liver: has been set up with GI at [facility]. New diagnosis post decompensation. Resumed low salt restrictions with lasix and aldactone.   Decreased lactulose again on 9/11, will consider d/cing  Will continue to monitor for encephalopathy.  Monitor weights daily. 2 gram tylenol max/day 9. Leucocytosis: Resolved  Monitor for signs of infection  WBCs 9.7 on 9/14  Afebrile  Cont to monitor 10.  Prediabetes: Hgb A1c- 6.1.   No CBG needed 11. ABLA  Hb 11.1 on 9/14  Cont to monitor 12. Hyponatremia  Na+ 134 on 9/14  Cont to monitor  LOS (Days) 16 A FACE TO FACE EVALUATION WAS PERFORMED  Jalyah Weinheimer Karis Juba 11/17/2016 10:53 AM

## 2016-11-17 NOTE — Progress Notes (Signed)
Occupational Therapy Session Note  Patient Details  Name: Jordan Thompson MRN: [id number] Date of Birth: Jun 24, 1965  Today's Date: 11/17/2016 OT Individual Time: 1300-1333 OT Individual Time Calculation (min): 33 min    Short Term Goals: Week 3:  OT Short Term Goal 1 (Week 3): Pt will complete LB bathing with min assist sit to stand.  OT Short Term Goal 2 (Week 3): Pt wil complete LB dressing sit to stand with min assist.  OT Short Term Goal 3 (Week 3): Pt will complete toilet transfers stand pivot with min assist.  OT Short Term Goal 4 (Week 3): Pt will complete shower transfers stand pivot with min assist.   OT Short Term Goal 5 (Week 3): Pt will use the RUE as a stabilizer for selfcare tasks with mod assist.   Skilled Therapeutic Interventions/Progress Updates:    Pt transferred from recliner to wheelchair with mod assist stand pivot with use of the hemi walker.  He then transferred to the therapy mat with min assist squat pivot to the right for work on Best boy.  Transitioned to supine with min assist.  Worked on BUE self AAROM using dowel rod for RUE.  Applied ace bandage on the LUE with focus on shoulder flexion and elbow extension in supine.  Mod facilitation to complete these movements.  Finished session with transition back to wheelchair with min guard assist for squat pivot to the left.  Returned to room at end of session with transfer back to recliner for comfort.  Pillow positioned under the LUE for support with call button and phone in reach.     Therapy Documentation Precautions:  Precautions Precautions: Fall Precaution Comments: R hemi Restrictions Weight Bearing Restrictions: No  Pain: Pain Assessment Pain Assessment: No/denies pain ADL: See Function Navigator for Current Functional Status.   Therapy/Group: Individual Therapy  Jayland Null OTR/L 11/17/2016, 2:07 PM

## 2016-11-18 ENCOUNTER — Inpatient Hospital Stay (HOSPITAL_COMMUNITY): Payer: Medicaid Other | Admitting: *Deleted

## 2016-11-18 ENCOUNTER — Inpatient Hospital Stay (HOSPITAL_COMMUNITY): Payer: Medicaid Other | Admitting: Occupational Therapy

## 2016-11-18 ENCOUNTER — Inpatient Hospital Stay (HOSPITAL_COMMUNITY): Payer: Self-pay

## 2016-11-18 NOTE — Progress Notes (Signed)
Recreational Therapy Session Note  Patient Details  Name: Jordan Thompson MRN: [id number] Date of Birth: 1965/05/21 Today's Date: 11/18/2016  Pain: no c/o Skilled Therapeutic Interventions/Progress Updates: Pt participated in community reintegration/outing to [employer] at [employer]	[place] supervision w/c level on level community surfaces. Goals focused on safe community mobility, sit-stands, stand pivot transfers with min assist., identification & negotiation of obstacles, accessing public restroom, energy conservation techniques/education.  See outing goal sheet in shadow chart for full details.  Ellawyn Wogan 11/18/2016, 8:11 AM

## 2016-11-18 NOTE — Progress Notes (Signed)
Social Work Patient ID: Jordan Thompson, male   DOB: 02/09/1966, 51 y.o.   MRN: [id number]  Have reviewed team conference with pt who is very pleased with extension and eager to make "as much progress as I can."  He reports the outing today went well and was very helpful.  Confirms plan for wife and other family to cover 24/7 assistance.  His hope is to be able to toilet on his own.  Explained that Dossie Der, LCSW will return on Monday and make all d/c arrangements.  Nalany Steedley, LCSW

## 2016-11-18 NOTE — Progress Notes (Signed)
Occupational Therapy Session Note  Patient Details  Name: Dijuan Sleeth MRN: [id number] Date of Birth: 07/05/1965  Today's Date: 11/18/2016 OT Individual Time: 0830-0900 OT Individual Time Calculation (min): 30 min    Short Term Goals: Week 1:  OT Short Term Goal 1 (Week 1): Pt will perform toilet transfer with mod A in order to decrease level of assistance with self care.  OT Short Term Goal 1 - Progress (Week 1): Met OT Short Term Goal 2 (Week 1): Pt will engaged in dynamic sitting balance task for 5 minutes with min A for balance. OT Short Term Goal 2 - Progress (Week 1): Met OT Short Term Goal 3 (Week 1): Pt will perform UB dressing with min A with modifications as needed in order to increase I in self care. OT Short Term Goal 3 - Progress (Week 1): Met Week 2:  OT Short Term Goal 1 (Week 2): Pt will complete LB bathing with min assist sit to stand.  OT Short Term Goal 1 - Progress (Week 2): Not met OT Short Term Goal 2 (Week 2): Pt will complete LB dressing sit to stand with mod assist.   OT Short Term Goal 2 - Progress (Week 2): Met OT Short Term Goal 3 (Week 2): Pt will complete toilet transfer with mod assist stand pivot. OT Short Term Goal 3 - Progress (Week 2): Met OT Short Term Goal 4 (Week 2): Pt will perform shower transfer with mod assist stand pivot to walk-in shower.  OT Short Term Goal 4 - Progress (Week 2): Met OT Short Term Goal 5 (Week 2): Pt will complete RUE AAROM exercises following handout with supervision.  OT Short Term Goal 5 - Progress (Week 2): Met Week 3:  OT Short Term Goal 1 (Week 3): Pt will complete LB bathing with min assist sit to stand.  OT Short Term Goal 2 (Week 3): Pt wil complete LB dressing sit to stand with min assist.  OT Short Term Goal 3 (Week 3): Pt will complete toilet transfers stand pivot with min assist.  OT Short Term Goal 4 (Week 3): Pt will complete shower transfers stand pivot with min assist.   OT Short Term Goal 5 (Week 3):  Pt will use the RUE as a stabilizer for selfcare tasks with mod assist.   Skilled Therapeutic Interventions/Progress Updates:    Pt seen this session for ADL training with a focus on balance and integrating his RUE into ADL tasks.  Pt received in w/c and due to short session time that was scheduled opted for a sponge bath at sink versus a shower.  Pt completed sit to stand at sink with close S.  Assisted with placing R hand on sink in wt bearing for tone reduction as he is developing increased tone in R finger flexors.  In sitting, he actively abducted shoulder to 30 degrees to reach under for washing.  Pt compensates by elevating shoulder, needs cues and guidance to isolate shoulder movement. Hand over hand guiding of R hand to wash L arm and torso. Pt actively using shoulder and elbow while therapist assisted with full elbow range of motion and stabilizing washcloth in his hand.  Pt was having more difficulty managing donning of shirt as he was feeling rushed and trying to hurry.  Encouraged pt to relax and that he can have more time in tomorrow's session to practice his hemidressing techniques.  Pt completed all dressing with min assist. At end of session, pt in w/c  at sink to complete grooming tasks.  Therapy Documentation Precautions:  Precautions Precautions: Fall Precaution Comments: R hemi Restrictions Weight Bearing Restrictions: No    Vital Signs: Therapy Vitals Temp: 98.4 F (36.9 C) Temp Source: Oral Pulse Rate: 78 Resp: 18 BP: 112/77 Patient Position (if appropriate): Lying Oxygen Therapy SpO2: 96 % O2 Device: Not Delivered Pain: Pain Assessment Pain Assessment: No/denies pain ADL:   See Function Navigator for Current Functional Status.   Therapy/Group: Individual Therapy  Bradner 11/18/2016, 8:26 AM

## 2016-11-18 NOTE — Progress Notes (Signed)
Orthopedic Tech Progress Note Patient Details:  [name] Jun 28, 1965 [id number]  Patient ID: Jordan Thompson, male   DOB: 19-Jan-1966, 51 y.o.   MRN: [id number]   [name] 11/18/2016, 10:10 AM Called in advanced brace order; spoke with [facility]

## 2016-11-18 NOTE — Progress Notes (Signed)
Occupational Therapy Session Note  Patient Details  Name: Mabel Unrein MRN: [id number] Date of Birth: 1965-04-30  Today's Date: 11/18/2016 OT Concurrent Time: 0930-1100 OT Concurrent Time Calculation (min): 90 min   Short Term Goals: See Outing goal sheet  Skilled Therapeutic Interventions/Progress Updates:    Engaged in community reintegration/outing to grocery store with focus on functional mobility, safety, and problem solving in community environment.  Pt navigated curb cut out to enter grocery store with supervision from w/c level.  Pt propelled with hemi-technique around grocery store, self-selecting rest breaks as needed.  Pt obtained items from lower shelves with use of RUE with hand over hand assist.  Problem solved accessing public restroom from w/c level with discussion regarding level of assist and maneuvering around obstacles as unable to complete toilet transfer due to setup of specific public restroom.  Discussed barriers and energy conservation strategies, with pt able to identify barriers. Completed transfers w/c <> van seat with min assist stand step transfer.  Therapy Documentation Precautions:  Precautions Precautions: Fall Precaution Comments: R hemi Restrictions Weight Bearing Restrictions: No Pain: Pain Assessment Pain Assessment: No/denies pain  See Function Navigator for Current Functional Status.   Therapy/Group: Outing/Concurrent  Rosalio Loud 11/18/2016, 11:51 AM

## 2016-11-18 NOTE — Progress Notes (Signed)
Physical Therapy Note  Patient Details  Name: Jordan Thompson MRN: [id number] Date of Birth: 01-16-1966 Today's Date: 11/18/2016  W/c propulsion using hemi method x 150' with supervision.  Squat pivot transfer to L with close supervision.  neuromuscular re-education via demo, multimodal cues for reciprocal scooting sitting on mat, for trunk shortening/lengthening to facilitate trunk righting. [name] here for AFO assessment.  He concurred that Spry Max ground reaction force AFO controls R knee well in stance phase.  Gait using wall rail with max assist x 20' and with RW , R hand splint with mod assist, max cues for upright trunk, forward gaze, R stance stability.Kinetron in sitting at resistance 30 cm/second, focusing on R hip and knee extension x 20 cycles x 2, and against resistance from PT x 10 x 2; attempted standing, but unsafe at this time..  PT instructed pt in diaphragmatic breathing to reduce Valsalva maneuver, and to promote relaxation.    Pt left resting in w/c with all needs within reach.  See function navigator for current status.  [name] 11/18/2016, 1:42 PM

## 2016-11-18 NOTE — Progress Notes (Signed)
Occupational Therapy Session Note  Patient Details  Name: Jordan Thompson MRN: [id number] Date of Birth: 03-27-1965  Today's Date: 11/18/2016 OT Individual Time: 1130-1205 OT Individual Time Calculation (min): 35 min    Short Term Goals: Week 3:  OT Short Term Goal 1 (Week 3): Pt will complete LB bathing with min assist sit to stand.  OT Short Term Goal 2 (Week 3): Pt wil complete LB dressing sit to stand with min assist.  OT Short Term Goal 3 (Week 3): Pt will complete toilet transfers stand pivot with min assist.  OT Short Term Goal 4 (Week 3): Pt will complete shower transfers stand pivot with min assist.   OT Short Term Goal 5 (Week 3): Pt will use the RUE as a stabilizer for selfcare tasks with mod assist.   Skilled Therapeutic Interventions/Progress Updates:    Pt completed toilet transfers from wheelchair to 3:1 with use of the RW and min assist.  Hand splint attached to the right side to assist with maintaining grip.  Mod assist for support during transfer with mod demonstrational cueing to achieve and maintain left knee and hip extension in stance phase.  He was able to advance the LLE without assistance but does exhibit increased adduction.  Performed transfer out of the bathroom with use of the hemiwalker and mod assist as well.  Discussed need for 3:1 for home and obtained one for his room in order to take measurements of width and height so his wife will be able to determine if the [facility] will fit.  Pt finished session in wheelchair with call button and phone in reach.    Therapy Documentation Precautions:  Precautions Precautions: Fall Precaution Comments: R hemi Restrictions Weight Bearing Restrictions: No  Pain: Pain Assessment Pain Assessment: No/denies pain ADL: See Function Navigator for Current Functional Status.   Therapy/Group: Individual Therapy  Kaylynn Chamblin OTR/L 11/18/2016, 12:18 PM

## 2016-11-18 NOTE — Patient Care Conference (Signed)
Inpatient RehabilitationTeam Conference and Plan of Care Update Date: 11/17/2016   Time: 2:00 PM    Patient Name: Jordan Thompson      Medical Record Number: [id number]  Date of Birth: 04/07/1965 Sex: Male         Room/Bed: 4M13C/4M13C-01 Payor Info: Payor: /    Admitting Diagnosis: rehab  Admit Date/Time:  11/01/2016  4:01 PM Admission Comments: No comment available   Primary Diagnosis:  <principal problem not specified> Principal Problem: <principal problem not specified>  Patient Active Problem List   Diagnosis Date Noted  . Hyponatremia   . Penile bleeding   . Right hemiparesis (HCC)   . 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 Adjustment disorder with mixed anxiety and depressed mood   . 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 Right hemiplegia (HCC)   . Vascular headache   . Hypokalemia   . Alcoholic cirrhosis (HCC)   . GERD without esophagitis   . Leukocytosis   . Acute blood loss anemia   . Benign essential HTN   . Hyperlipidemia   . History of CVA (cerebrovascular accident)   . Prediabetes   . Stroke (cerebrum) (HCC) - acute right cerebellar PICA territory and left paramedian medulla infarcts 10/28/2016    Expected Discharge Date: Expected Discharge Date: 11/27/16  Team Members Present: Physician leading conference: Dr. 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  Supervision for UB dressing with mod assist for LB dressing.  Mod assist for stand pivot transfers with min assist for squat pivot transfers.  Brunnstrum stage II-III in the right arm but only stage I in the hand.    supervision for grooming and UB selfcare, min to mod assist for LB selfcare and min assist for transfers.  selfcare retraining, balance retraining, transfer training, neuromuscular re-education NMES, therapeutic exercise, pt/family education   Mobility   Min guard-supervision transfers via squat or stand pivot, Min A gait up to 10' w/ hemiwalker, Mod A gait up to 31' w/ rail in hallway, supervision w/c  Mod A gait, supervision transfers and w/c mobility  RLE NMR, gait training, transfers and w/c mobility in home environment, D/C planning   Communication             Safety/Cognition/ Behavioral Observations            Pain   pain meanaged with prn tylenol  pain less than or equal 2  assess pain q shift and prn   Skin   no skin issues  no new skin break down  assess skin q shift and prn      *See Care Plan and progress notes for long and short-term goals.     Barriers to Discharge  Current Status/Progress Possible Resolutions Date  Resolved   Physician    Decreased caregiver support;Medical stability;Lack of/limited family support     See above  Therapies, follow labs      Nursing                  PT  Inaccessible home environment;Decreased caregiver support;Home environment access/layout;Lack of/limited family support  May not have 24/7 supervision as wife has to work, would need to get home to supervision-Mod I transfers and pt would like to be able to walk at home w/ family member if possible              OT                  SLP                SW                Discharge Planning/Teaching Needs:  HOme with wife and mother in-law providing care.   Teaching to  be planned prior to d/c.   Team Discussion:  Doing very well overall.  Cont b/b, no pain and good po intake. Min/mod assist b/d;  Mod assist standing balance.  Showing increased should movement.  Transfers can be supervision to min with squat-pivot.  Goals set for mod ind transfers and min assist standing.  Pt extremely motivated and team recommends slight extension of stay to the end of next week.  Revisions to Treatment Plan:  Extension of LOS    Continued Need for Acute Rehabilitation Level of Care: The patient requires daily medical management by a physician with specialized training in physical medicine and rehabilitation for the following conditions: Daily direction of a multidisciplinary physical rehabilitation program to ensure safe treatment while eliciting the highest outcome that is of practical value to the patient.: Yes Daily medical management of patient stability for increased activity during participation in an intensive rehabilitation regime.: Yes Daily analysis of laboratory values and/or radiology reports with any subsequent need for medication adjustment of medical intervention for : Neurological problems;Other  Chai Routh 11/18/2016, 3:32 PM

## 2016-11-18 NOTE — Progress Notes (Signed)
[place] PHYSICAL MEDICINE & REHABILITATION     PROGRESS NOTE  Subjective/Complaints:  Pt seen sitting up in bed this AM.  He slept well overnight.  He states his family will be coming on Saturday for education.    ROS: Denies CP, SOB, N/V/D.  Objective: Vital Signs: Blood pressure 112/77, pulse 78, temperature 98.4 F (36.9 C), temperature source Oral, resp. rate 18, height  (1.778 m), weight 96.4 kg (212 lb 8.4 oz), SpO2 96 %. No results found. No results for input(s): WBC, HGB, HCT, PLT in the last 72 hours. No results for input(s): NA, K, CL, GLUCOSE, BUN, CREATININE, CALCIUM in the last 72 hours.  Invalid input(s): CO CBG (last 3)  No results for input(s): GLUCAP in the last 72 hours.  Wt Readings from Last 3 Encounters:  11/18/16 96.4 kg (212 lb 8.4 oz)  10/28/16 97.7 kg (215 lb 6.2 oz)    Physical Exam:  BP 112/77 (BP Location: Left Arm)   Pulse 78   Temp 98.4 F (36.9 C) (Oral)   Resp 18   Ht  (1.778 m)   Wt 96.4 kg (212 lb 8.4 oz)   SpO2 96%   BMI 30.49 kg/m  Constitutional: He appears well-developed and well-nourished. No distress.  HENT: Normocephalic and atraumatic.  Eyes: EOM are normal. No discharge.  Cardiovascular: RRR.  No JVD Respiratory: Effort normal and breath sounds normal. GI: Bowel sounds are normal. He exhibits no distension.  Musculoskeletal: He exhibits no edema, no tenderness Neurological: He is alert and oriented.  Dysphonia Improving.  Right facial weakness.  Verbose.   RUE: Shoulder abduction 3-/5, elbow extension/flexion 3-/5, hand grip 1/5 (gradually improving) RLE: 4/5 HF, KE, 3-/5 ADF/PF (gradually improving) LUE/LLE: 5/5 proximal to distal Skin: Skin is warm and dry. He is not diaphoretic.  Psychiatric: He has a normal mood and affect. His behavior is normal. Thought content normal.   Assessment/Plan: 1. Functional deficits secondary to left medullary infarct, bilateral cerebellar infarcts which require 3+ hours  per day of interdisciplinary therapy in a comprehensive inpatient rehab setting. Physiatrist is providing close team supervision and 24 hour management of active medical problems listed below. Physiatrist and rehab team continue to assess barriers to discharge/monitor patient progress toward functional and medical goals.  Function:  Bathing Bathing position   Position: Wheelchair/chair at sink  Bathing parts Body parts bathed by patient: Right arm, Chest, Abdomen, Right upper leg, Left upper leg, Left lower leg, Right lower leg, Front perineal area Body parts bathed by helper: Left arm, Back, Buttocks  Bathing assist Assist Level: Touching or steadying assistance(Pt > 75%)      Upper Body Dressing/Undressing Upper body dressing   What is the patient wearing?: Pull over shirt/dress     Pull over shirt/dress - Perfomed by patient: Thread/unthread left sleeve, Put head through opening, Pull shirt over trunk, Thread/unthread right sleeve Pull over shirt/dress - Perfomed by helper: Thread/unthread right sleeve        Upper body assist Assist Level: Supervision or verbal cues      Lower Body Dressing/Undressing Lower body dressing   What is the patient wearing?: Pants, Socks, Shoes, AFO     Pants- Performed by patient: Thread/unthread right pants leg, Thread/unthread left pants leg Pants- Performed by helper: Pull pants up/down Non-skid slipper socks- Performed by patient: Don/doff right sock, Don/doff left sock Non-skid slipper socks- Performed by helper: Don/doff right sock, Don/doff left sock Socks - Performed by patient: Don/doff right sock, Don/doff  left sock Socks - Performed by helper: Don/doff right sock, Don/doff left sock Shoes - Performed by patient: Don/doff left shoe Shoes - Performed by helper: Don/doff right shoe   AFO - Performed by helper: Don/doff right AFO      Lower body assist Assist for lower body dressing: Touching or steadying assistance (Pt > 75%)       Toileting Toileting   Toileting steps completed by patient: Performs perineal hygiene Toileting steps completed by helper: Adjust clothing prior to toileting, Adjust clothing after toileting Toileting Assistive Devices: Grab bar or rail  Toileting assist Assist level: Touching or steadying assistance (Pt.75%)   Transfers Chair/bed transfer Chair/bed transfer activity did not occur: N/A Chair/bed transfer method: Squat pivot Chair/bed transfer assist level: Supervision or verbal cues Chair/bed transfer assistive device: Armrests Mechanical lift: Stedy   Locomotion Ambulation Ambulation activity did not occur: Safety/medical concerns (R hemiplegia w/ 0/5 MMT globally)   Max distance: 15 ft Assist level: Moderate assist (Pt 50 - 74%)   Wheelchair   Type: Manual Max wheelchair distance: 150' Assist Level: Supervision or verbal cues  Cognition Comprehension Comprehension assist level: Follows complex conversation/direction with extra time/assistive device  Expression Expression assist level: Expresses complex ideas: With extra time/assistive device  Social Interaction Social Interaction assist level: Interacts appropriately with others with medication or extra time (anti-anxiety, antidepressant).  Problem Solving Problem solving assist level: Solves complex problems: Recognizes & self-corrects  Memory Memory assist level: Recognizes or recalls 90% of the time/requires cueing < 10% of the time    Medical Problem List and Plan: 1.  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   Cont CIR  WHO/PRAFO  2.  DVT Prophylaxis/Anticoagulation: Pharmaceutical: Lovenox d/ced due to bruising and penile bleeding 3. HA/Pain Management: Continue tylenol prn for HA.  4. Mood: LCSW to follow for evaluation and support. Team to provide ego support.  5. Neuropsych: This patient is capable of making decisions on his own behalf. 6. Skin/Wound Care:  routine pressure relief measures.  7. Fluids/Electrolytes/Nutrition: Monitor I/Os 8. HTN: Monitor BP bid--continue lasix and aldactone.   Controlled 9/20 Vitals:   11/17/16 1500 11/18/16 0500  BP: 118/84 112/77  Pulse: 74 78  Resp: 16 18  Temp: 98.1 F (36.7 C) 98.4 F (36.9 C)  SpO2: 98% 96%   9. Cirrhosis of liver: has been set up with GI at [facility]. New diagnosis post decompensation. Resumed low salt restrictions with lasix and aldactone.   Decreased lactulose again on 9/11, will consider d/cing  Will continue to monitor for encephalopathy.  Monitor weights daily. 2 gram tylenol max/day 9. Leucocytosis: Resolved  Monitor for signs of infection  WBCs 9.7 on 9/14  Afebrile  Cont to monitor 10.  Prediabetes: Hgb A1c- 6.1.   No CBG needed 11. ABLA  Hb 11.1 on 9/14  Labs ordered for tomorrow  Cont to monitor 12. Hyponatremia  Na+ 134 on 9/14  Labs ordered for tomorrow  Cont to monitor  LOS (Days) 17 A FACE TO FACE EVALUATION WAS PERFORMED  Ankit Karis Juba 11/18/2016 8:36 AM

## 2016-11-19 ENCOUNTER — Inpatient Hospital Stay (HOSPITAL_COMMUNITY): Payer: Self-pay

## 2016-11-19 ENCOUNTER — Inpatient Hospital Stay (HOSPITAL_COMMUNITY): Payer: Medicaid Other | Admitting: Occupational Therapy

## 2016-11-19 ENCOUNTER — Inpatient Hospital Stay (HOSPITAL_COMMUNITY): Payer: Self-pay | Admitting: Occupational Therapy

## 2016-11-19 ENCOUNTER — Inpatient Hospital Stay (HOSPITAL_COMMUNITY): Payer: Self-pay | Admitting: Physical Therapy

## 2016-11-19 LAB — BASIC METABOLIC PANEL
Anion gap: 9 (ref 5–15)
BUN: 7 mg/dL (ref 6–20)
CALCIUM: 9.3 mg/dL (ref 8.9–10.3)
CO2: 24 mmol/L (ref 22–32)
Chloride: 103 mmol/L (ref 101–111)
Creatinine, Ser: 0.75 mg/dL (ref 0.61–1.24)
GFR calc Af Amer: >60 mL/min (ref >60)
GLUCOSE: 114 mg/dL — AB (ref 65–99)
Potassium: 3.8 mmol/L (ref 3.5–5.1)
Sodium: 136 mmol/L (ref 135–145)

## 2016-11-19 LAB — CBC
HCT: 35.7 % — ABNORMAL LOW (ref 39.0–52.0)
Hemoglobin: 11.2 g/dL — ABNORMAL LOW (ref 13.0–17.0)
MCH: 26.3 pg (ref 26.0–34.0)
MCHC: 31.4 g/dL (ref 30.0–36.0)
MCV: 83.8 fL (ref 78.0–100.0)
PLATELETS: 274 10*3/uL (ref 150–400)
RBC: 4.26 MIL/uL (ref 4.22–5.81)
RDW: 17.3 % — ABNORMAL HIGH (ref 11.5–15.5)
WBC: 9.4 10*3/uL (ref 4.0–10.5)

## 2016-11-19 NOTE — Progress Notes (Signed)
Physical Therapy Session Note  Patient Details  Name: Jordan Thompson MRN: [id number] Date of Birth: 08-16-1965  Today's Date: 11/19/2016 PT Individual Time: 1045-1157, 72 min  Short Term Goals: Week 3:  PT Short Term Goal 1 (Week 3): STG=LTG due to ELOS  Skilled Therapeutic Interventions/Progress Updates:   Pt in w/c upon arrival and agreeable to therapy, no c/o pain. Pt self-propelled w/c to/from ortho gym, 52' each way, to work on independence w/ mobility. Worked on gait training this session in [place]. Attempted over treadmill, however pt unable to maintain lowest speed setting on treadmill, moved to gait over flat/firm surface w/ litegait. Ambulated 40' x2 w/ seated rest in between due to fatigue. Verbal cues for symmetrical step length, weight-shifting during R single leg stance and weight acceptance. Emphasis on RLE weight-bearing and step-through gait pattern. Pt w/ LUE support during gait on litegait handle. Additionally spent time performing lateral weight shifts w/ support of harness to facilitate RLE weightbearing. Ambulated 30' x2 w/ RW and RUE splint on walker, Min-Mod A from therapist. Pt w/ increased reciprocal movement pattern after litegait, however still required frequent verbal cues for gait pattern and increased R step length and control w/ R weight acceptance. Ended session in recliner, call bell within reach and all needs met.   Therapy Documentation Precautions:  Precautions Precautions: Fall Precaution Comments: R hemi Restrictions Weight Bearing Restrictions: No  See Function Navigator for Current Functional Status.   Therapy/Group: Individual Therapy  Kameo Bains K Arnette 11/20/2016, 10:13 PM

## 2016-11-19 NOTE — Progress Notes (Signed)
Occupational Therapy Session Note  Patient Details  Name: Jordan Thompson MRN: [id number] Date of Birth: 07-13-1965  Today's Date: 11/19/2016 OT Individual Time: 5945-8592 OT Individual Time Calculation (min): 64 min    Short Term Goals: Week 3:  OT Short Term Goal 1 (Week 3): Pt will complete LB bathing with min assist sit to stand.  OT Short Term Goal 2 (Week 3): Pt wil complete LB dressing sit to stand with min assist.  OT Short Term Goal 3 (Week 3): Pt will complete toilet transfers stand pivot with min assist.  OT Short Term Goal 4 (Week 3): Pt will complete shower transfers stand pivot with min assist.   OT Short Term Goal 5 (Week 3): Pt will use the RUE as a stabilizer for selfcare tasks with mod assist.   Skilled Therapeutic Interventions/Progress Updates:    1:1. Pt with no c/o pain. Pt propels w/c throughout session with supervision for general conditioning and VC for mobility throughout tight spaces. Pt completes 2x15 towel glides (first round sitting, second standing) LUE over RUE to facilitate weight bearing for/backward, horizontal, circles, V's and diagonals with seated rest breaks during standing round. Pt requires VC while standing for weight bearing through RLE and posture. Pt discusses set up of bathroom at home and modifications being made. Pt completes stand step transfer with MIN A for balance and VC for LE management. Pt completes clothing management in standing with min A and simulates hygiene while seated on toilet with lateral lean. Pt verbalizes feeling more comfortable about d/c home after practicing toileting and OT encourages pt to practice frequently to improve balance and safety. Exited session with pt seated in recliner with call light in reach and all needs met.   Therapy Documentation Precautions:  Precautions Precautions: Fall Precaution Comments: R hemi Restrictions Weight Bearing Restrictions: No  See Function Navigator for Current Functional  Status.   Therapy/Group: Individual Therapy  [name] 11/19/2016, 4:12 PM

## 2016-11-19 NOTE — Progress Notes (Signed)
[place] PHYSICAL MEDICINE & REHABILITATION     PROGRESS NOTE  Subjective/Complaints:   No issues overnite  ROS: Denies CP, SOB, N/V/D.  Objective: Vital Signs: Blood pressure 121/69, pulse 80, temperature 98.3 F (36.8 C), temperature source Oral, resp. rate 12, height  (1.778 m), weight 96 kg (211 lb 11.2 oz), SpO2 96 %. No results found.  Recent Labs  11/19/16 0528  WBC 9.4  HGB 11.2*  HCT 35.7*  PLT 274    Recent Labs  11/19/16 0528  NA 136  K 3.8  CL 103  GLUCOSE 114*  BUN 7  CREATININE 0.75  CALCIUM 9.3   CBG (last 3)  No results for input(s): GLUCAP in the last 72 hours.  Wt Readings from Last 3 Encounters:  11/19/16 96 kg (211 lb 11.2 oz)  10/28/16 97.7 kg (215 lb 6.2 oz)    Physical Exam:  BP 121/69 (BP Location: Left Arm)   Pulse 80   Temp 98.3 F (36.8 C) (Oral)   Resp 12   Ht  (1.778 m)   Wt 96 kg (211 lb 11.2 oz)   SpO2 96%   BMI 30.38 kg/m  Constitutional: He appears well-developed and well-nourished. No distress.  HENT: Normocephalic and atraumatic.  Eyes: EOM are normal. No discharge.  Cardiovascular: RRR.  No JVD Respiratory: Effort normal and breath sounds normal. GI: Bowel sounds are normal. He exhibits no distension.  Musculoskeletal: He exhibits no edema, no tenderness Neurological: He is alert and oriented.  Dysphonia Improving.  Right facial weakness.  Verbose.   RUE: Shoulder abduction 3-/5, elbow extension/flexion 3-/5, hand grip 1/5 (gradually improving) RLE: 4/5 HF, KE, 3-/5 ADF/PF (gradually improving) LUE/LLE: 5/5 proximal to distal Skin: Skin is warm and dry. He is not diaphoretic.  Psychiatric: He has a normal mood and affect. His behavior is normal. Thought content normal.   Assessment/Plan: 1. 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 Physiatrist is  providing close team supervision and 24 hour management of active medical problems listed below. Physiatrist and rehab team continue to assess barriers to discharge/monitor patient progress toward functional and medical goals.  Function:  Bathing Bathing position   Position: Wheelchair/chair at sink  Bathing parts Body parts bathed by patient: Right arm, Chest, Abdomen, Right upper leg, Left upper leg, Left lower leg, Right lower leg, Front perineal area Body parts bathed by helper: Left arm, Back, Buttocks  Bathing assist Assist Level: Touching or steadying assistance(Pt > 75%)      Upper Body Dressing/Undressing Upper body dressing   What is the patient wearing?: Pull over shirt/dress     Pull over shirt/dress - Perfomed by patient: Thread/unthread left sleeve, Put head through opening, Pull shirt over trunk, Thread/unthread right sleeve Pull over shirt/dress - Perfomed by helper: Thread/unthread right sleeve        Upper body assist Assist Level: Supervision or verbal cues      Lower Body Dressing/Undressing Lower body dressing   What is the patient wearing?: Pants, Socks, Shoes, AFO     Pants- Performed by patient: Thread/unthread right pants leg, Thread/unthread left pants leg Pants- Performed by helper: Pull pants up/down Non-skid slipper socks- Performed by patient: Don/doff right sock, Don/doff left sock Non-skid slipper socks- Performed by helper: Don/doff right sock, Don/doff left sock Socks - Performed by patient: Don/doff right sock, Don/doff left sock Socks - Performed by helper:  Don/doff right sock, Don/doff left sock Shoes - Performed by patient: Don/doff left shoe Shoes - Performed by helper: Don/doff right shoe   AFO - Performed by helper: Don/doff right AFO      Lower body assist Assist for lower body dressing: Touching or steadying assistance (Pt > 75%)      Toileting Toileting   Toileting steps completed by patient: Adjust clothing prior to  toileting, Performs perineal hygiene, Adjust clothing after toileting Toileting steps completed by helper: Adjust clothing prior to toileting, Adjust clothing after toileting Toileting Assistive Devices: Grab bar or rail  Toileting assist Assist level: Touching or steadying assistance (Pt.75%)   Transfers Chair/bed transfer Chair/bed transfer activity did not occur: N/A Chair/bed transfer method: Squat pivot Chair/bed transfer assist level: Supervision or verbal cues (to L) Chair/bed transfer assistive device: Armrests Mechanical lift: Stedy   Locomotion Ambulation Ambulation activity did not occur: Safety/medical concerns (R hemiplegia w/ 0/5 MMT globally)   Max distance: 20 Assist level: Moderate assist (Pt 50 - 74%)   Wheelchair   Type: Manual Max wheelchair distance: 150 Assist Level: Supervision or verbal cues  Cognition Comprehension Comprehension assist level: Follows complex conversation/direction with extra time/assistive device  Expression Expression assist level: Expresses complex ideas: With extra time/assistive device  Social Interaction Social Interaction assist level: Interacts appropriately with others with medication or extra time (anti-anxiety, antidepressant).  Problem Solving Problem solving assist level: Solves complex problems: Recognizes & self-corrects  Memory Memory assist level: Recognizes or recalls 90% of the time/requires cueing < 10% of the time    Medical Problem List and Plan: 1.  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   Cont CIR PT, OT  WHO/PRAFO  2.  DVT Prophylaxis/Anticoagulation: Pharmaceutical: Lovenox d/ced due to bruising and penile bleeding 3. HA/Pain Management: Continue tylenol prn for HA.  4. Mood: LCSW to follow for evaluation and support. Team to provide ego support.  5. Neuropsych: This patient is capable of making decisions on his own behalf. 6. Skin/Wound Care: routine  pressure relief measures.  7. Fluids/Electrolytes/Nutrition: Monitor I/Os 8. HTN: Monitor BP bid--continue lasix and aldactone.   Controlled 9/21 Vitals:   11/18/16 1523 11/19/16 0635  BP: 114/81 121/69  Pulse: 81 80  Resp: 18 12  Temp: 98.4 F (36.9 C) 98.3 F (36.8 C)  SpO2: 96% 96%   9. Cirrhosis of liver: has been set up with GI at [facility]. New diagnosis post decompensation. Resumed low salt restrictions with lasix and aldactone.   Decreased lactulose again on 9/11, will consider d/cing  Will continue to monitor for encephalopathy.  Monitor weights daily. 2 gram tylenol max/day 9. Leucocytosis: Resolved  Monitor for signs of infection  WBCs 9.7 on 9/14  Afebrile  Cont to monitor 10.  Prediabetes: Hgb A1c- 6.1.   No CBG needed 11. ABLA  Hb 11.2 on 9/21  Stable vs 11.1, 1 wk ago  Cont to monitor 12. Hyponatremia  Resolved Na+ 136 on 9/21   Cont to monitor  LOS (Days) 18 A FACE TO FACE EVALUATION WAS PERFORMED  Claudette Laws E 11/19/2016 7:22 AM

## 2016-11-19 NOTE — Progress Notes (Signed)
Occupational Therapy Session Note  Patient Details  Name: Jordan Thompson MRN: [id number] Date of Birth: 03/08/1965  Today's Date: 11/19/2016 OT Individual Time: 0800-0902 OT Individual Time Calculation (min): 62 min    Short Term Goals: Week 3:  OT Short Term Goal 1 (Week 3): Pt will complete LB bathing with min assist sit to stand.  OT Short Term Goal 2 (Week 3): Pt wil complete LB dressing sit to stand with min assist.  OT Short Term Goal 3 (Week 3): Pt will complete toilet transfers stand pivot with min assist.  OT Short Term Goal 4 (Week 3): Pt will complete shower transfers stand pivot with min assist.   OT Short Term Goal 5 (Week 3): Pt will use the RUE as a stabilizer for selfcare tasks with mod assist.   Skilled Therapeutic Interventions/Progress Updates:    Pt completed shower and dressing during session.  Min assist for stand pivot transfers to and from the shower with use of the grab bar on the right side.  Min instructional cueing for hand placement for pushing up from the wheelchair during transfers and sit to stand instead of pulling up on the sink.  He was able to complete bathing with overall min assist sit to stand.  Max hand over hand assist to integrate the RUE into the bathing task. Min assist for crossing the RLE over the left and maintaining for donning LB clothing.  Min assist for donning AFO in order to fasten velcro straps.  He completed all grooming tasks from wheelchair level with setup and adaptive technique for holding toothbrush.    Therapy Documentation Precautions:  Precautions Precautions: Fall Precaution Comments: R hemi Restrictions Weight Bearing Restrictions: No   Pain: Pain Assessment Pain Assessment: No/denies pain ADL: See Function Navigator for Current Functional Status.   Therapy/Group: Individual Therapy  Davena Julian OTR/L 11/19/2016, 12:38 PM

## 2016-11-20 ENCOUNTER — Inpatient Hospital Stay (HOSPITAL_COMMUNITY): Payer: Self-pay | Admitting: Physical Therapy

## 2016-11-20 ENCOUNTER — Inpatient Hospital Stay (HOSPITAL_COMMUNITY): Payer: Medicaid Other | Admitting: Occupational Therapy

## 2016-11-20 NOTE — Progress Notes (Signed)
Occupational Therapy Session Note  Patient Details  Name: Jordan Thompson MRN: [id number] Date of Birth: 20-Dec-1965  Today's Date: 11/20/2016 OT Individual Time: 1001-1108 OT Individual Time Calculation (min): 67 min    Short Term Goals: Week 3:  OT Short Term Goal 1 (Week 3): Pt will complete LB bathing with min assist sit to stand.  OT Short Term Goal 2 (Week 3): Pt wil complete LB dressing sit to stand with min assist.  OT Short Term Goal 3 (Week 3): Pt will complete toilet transfers stand pivot with min assist.  OT Short Term Goal 4 (Week 3): Pt will complete shower transfers stand pivot with min assist.   OT Short Term Goal 5 (Week 3): Pt will use the RUE as a stabilizer for selfcare tasks with mod assist.   Skilled Therapeutic Interventions/Progress Updates:    Pt completed bathing and dressing sit to stand to start session.  Pt's family present and educated on techniques needed for bathing and dressing, and wife also allowed to assist pt with sit to stand and for crossing the RLE over the left knee and maintaining so he could complete LB bathing.  Educated on use of the walker with min assist for sit to stand as well as sequencing and RUE and RLE foot positioning.  Discussed bathroom setup at home and pt will likely need toilet riser instead of 3:1.  They will place grab bar on the left wall for use as well.  Pt will need continued practice with transfers to the toilet using simulated setup like home as neither walker nor wheelchair will fit in the bathroom at this time and pt will likely have to rely on just use of the grab bar for transfer after placing wheelchair at the doorway.    Therapy Documentation Precautions:  Precautions Precautions: Fall Precaution Comments: R hemi Restrictions Weight Bearing Restrictions: No   Pain: Pain Assessment Pain Assessment: No/denies pain ADL: See Function Navigator for Current Functional Status.   Therapy/Group: Individual  Therapy  Jahan Friedlander OTR/L 11/20/2016, 12:18 PM

## 2016-11-20 NOTE — Progress Notes (Signed)
[facility] PHYSICAL MEDICINE & REHABILITATION     PROGRESS NOTE  Subjective/Complaints:   Had a good night. Slept well. No pain  ROS: pt denies nausea, vomiting, diarrhea, cough, shortness of breath or chest pain   Objective: Vital Signs: Blood pressure 119/67, pulse 78, temperature 97.8 F (36.6 C), temperature source Oral, resp. rate 16, height  (1.778 m), weight 96.3 kg (212 lb 4.8 oz), SpO2 95 %. No results found.  Recent Labs  11/19/16 0528  WBC 9.4  HGB 11.2*  HCT 35.7*  PLT 274    Recent Labs  11/19/16 0528  NA 136  K 3.8  CL 103  GLUCOSE 114*  BUN 7  CREATININE 0.75  CALCIUM 9.3   CBG (last 3)  No results for input(s): GLUCAP in the last 72 hours.  Wt Readings from Last 3 Encounters:  11/20/16 96.3 kg (212 lb 4.8 oz)  10/28/16 97.7 kg (215 lb 6.2 oz)    Physical Exam:  BP 119/67 (BP Location: Left Arm)   Pulse 78   Temp 97.8 F (36.6 C) (Oral)   Resp 16   Ht  (1.778 m)   Wt 96.3 kg (212 lb 4.8 oz)   SpO2 95%   BMI 30.46 kg/m  Constitutional: He appears well-developed and well-nourished. No distress.  HENT: Normocephalic and atraumatic.  Eyes: EOM are normal. No discharge.  Cardiovascular: RRR without murmur. No JVD  Respiratory: CTA Bilaterally without wheezes or rales. Normal effort  GI: Bowel sounds are normal. He exhibits no distension.  Musculoskeletal: He exhibits no edema, no tenderness Neurological: He is alert and oriented.  Dysphonia Improving.  Right facial weakness.  Verbose.   RUE: Shoulder abduction 3-/5, elbow extension/flexion 3-/5, hand grip tr-- 1/5 (gradually improving) RLE: 4/5 HF, KE, 3-/5 ADF/PF (stable) LUE/LLE: 5/5 proximal to distal Skin: Skin is warm and dry. He is not diaphoretic.  Psychiatric: He has a normal mood and affect. His behavior is normal. Thought content normal.   Assessment/Plan: 1. 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 Physiatrist is providing close team supervision and 24 hour management of active medical problems listed below. Physiatrist and rehab team continue to assess barriers to discharge/monitor patient progress toward functional and medical goals.  Function:  Bathing Bathing position   Position: Shower  Bathing parts Body parts bathed by patient: Right arm, Chest, Abdomen, Front perineal area, Buttocks, Right upper leg, Left upper leg, Right lower leg, Left lower leg Body parts bathed by helper: Back, Left arm  Bathing assist Assist Level: Touching or steadying assistance(Pt > 75%)      Upper Body Dressing/Undressing Upper body dressing   What is the patient wearing?: Pull over shirt/dress     Pull over shirt/dress - Perfomed by patient: Thread/unthread left sleeve, Put head through opening, Pull shirt over trunk, Thread/unthread right sleeve Pull over shirt/dress - Perfomed by helper: Thread/unthread right sleeve        Upper body assist Assist Level: Supervision or verbal cues      Lower Body Dressing/Undressing Lower body dressing   What is the patient wearing?: Pants, Socks, Shoes, AFO     Pants- Performed by patient: Thread/unthread right pants leg, Thread/unthread left pants leg, Pull pants up/down Pants- Performed by helper: Pull pants up/down Non-skid slipper socks- Performed by patient: Don/doff right sock, Don/doff left sock Non-skid slipper socks- Performed by helper: Don/doff right  sock, Don/doff left sock Socks - Performed by patient: Don/doff right sock, Don/doff left sock Socks - Performed by helper: Don/doff right sock, Don/doff left sock Shoes - Performed by patient: Don/doff left shoe Shoes - Performed by helper: Don/doff right shoe   AFO - Performed by helper: Don/doff right AFO      Lower body assist Assist for lower body dressing: Touching or steadying assistance (Pt >  75%)      Toileting Toileting   Toileting steps completed by patient: Performs perineal hygiene Toileting steps completed by helper: Adjust clothing prior to toileting, Adjust clothing after toileting Toileting Assistive Devices: Grab bar or rail  Toileting assist Assist level: Touching or steadying assistance (Pt.75%)   Transfers Chair/bed transfer Chair/bed transfer activity did not occur: N/A Chair/bed transfer method: Squat pivot Chair/bed transfer assist level: Supervision or verbal cues (to L) Chair/bed transfer assistive device: Armrests Mechanical lift: Stedy   Locomotion Ambulation Ambulation activity did not occur: Safety/medical concerns (R hemiplegia w/ 0/5 MMT globally)   Max distance: 30' Assist level: Moderate assist (Pt 50 - 74%)   Wheelchair   Type: Manual Max wheelchair distance: 43' Assist Level: Supervision or verbal cues  Cognition Comprehension Comprehension assist level: Follows complex conversation/direction with extra time/assistive device  Expression Expression assist level: Expresses complex ideas: With extra time/assistive device  Social Interaction Social Interaction assist level: Interacts appropriately with others with medication or extra time (anti-anxiety, antidepressant).  Problem Solving Problem solving assist level: Solves complex problems: Recognizes & self-corrects  Memory Memory assist level: Recognizes or recalls 90% of the time/requires cueing < 10% of the time    Medical Problem List and Plan: 1.  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   Cont CIR PT, OT  WHO/PRAFO  2.  DVT Prophylaxis/Anticoagulation: Pharmaceutical: Lovenox d/ced due to bruising and penile bleeding 3. HA/Pain Management: Continue tylenol prn for HA.  4. Mood: LCSW to follow for evaluation and support. Team to provide ego support.  5. Neuropsych: This patient is capable of making decisions on his own  behalf. 6. Skin/Wound Care: routine pressure relief measures.  7. Fluids/Electrolytes/Nutrition: Monitor I/Os 8. HTN: Monitor BP bid--continue lasix and aldactone.   Controlled 9/21 Vitals:   11/19/16 1344 11/20/16 0500  BP: 109/75 119/67  Pulse: 84 78  Resp: 15 16  Temp: 98.4 F (36.9 C) 97.8 F (36.6 C)  SpO2: 99% 95%   9. Cirrhosis of liver: has been set up with GI at [facility]. New diagnosis post decompensation. Resumed low salt restrictions with lasix and aldactone.   Decreased lactulose again on 9/11, will consider d/cing  Will continue to monitor for encephalopathy.  Monitor weights daily. 2 gram tylenol max/day 9. Leucocytosis: Resolved  Monitor for signs of infection  WBCs 9.7 on 9/14  Afebrile  Cont to monitor 10.  Prediabetes: Hgb A1c- 6.1.   No CBG needed 11. ABLA  Hb 11.2 on 9/21  Stable vs 11.1, 1 wk ago  Cont to monitor 12. Hyponatremia  Resolved Na+ 136 on 9/21   Cont to monitor  LOS (Days) 19 A FACE TO FACE EVALUATION WAS PERFORMED  Kahlie Deutscher T 11/20/2016 8:52 AM

## 2016-11-20 NOTE — Progress Notes (Signed)
Physical Therapy Session Note  Patient Details  Name: Jordan Thompson MRN: [id number] Date of Birth: 07-Oct-1965  Today's Date: 11/20/2016 PT Individual Time: 1110-1215 PT Individual Time Calculation (min): 65 min   Short Term Goals: Week 3:  PT Short Term Goal 1 (Week 3): STG=LTG due to ELOS  Skilled Therapeutic Interventions/Progress Updates:   Pt in w/c upon arrival and agreeable to therapy, no c/o pain. Wife, mother-in-law, and niece present for family education. Instructed family on car transfer, bed<>w/c transfer, w/c mobility, and bed mobility w/ Min guard or close supervision assist for pt. All family members demonstrated understanding and asked appropriate questions. All family and pt verbalized understanding and in agreement that pt will need 24/7 assist/supervision upon discharge to home. Pt ambulated 30' w/ RW w/ Min A from therapist and frequent verbal cues for gait pattern. Pt and family understood that pt is not safe to ambulate w/ any one other than a therapist at this time. Spent > 20 minutes of session answering questions and educating family on pt's current level of function, anticipated level of function at discharge, and need for on-going skilled PT services upon discharge. Pt ended session in w/c and in care of family, all needs met.   Therapy Documentation Precautions:  Precautions Precautions: Fall Precaution Comments: R hemi Restrictions Weight Bearing Restrictions: No Pain: Pain Assessment Pain Assessment: No/denies pain  See Function Navigator for Current Functional Status.   Therapy/Group: Individual Therapy  Manvir Prabhu K Arnette 11/20/2016, 12:25 PM

## 2016-11-21 ENCOUNTER — Inpatient Hospital Stay (HOSPITAL_COMMUNITY): Payer: Medicaid Other | Admitting: Occupational Therapy

## 2016-11-21 NOTE — Progress Notes (Signed)
[place] PHYSICAL MEDICINE & REHABILITATION     PROGRESS NOTE  Subjective/Complaints:  No new complaints. Feels well  ROS: pt denies nausea, vomiting, diarrhea, cough, shortness of breath or chest pain   Objective: Vital Signs: Blood pressure 119/78, pulse 70, temperature 98.6 F (37 C), temperature source Oral, resp. rate 16, height  (1.778 m), weight 96.4 kg (212 lb 8 oz), SpO2 97 %. No results found.  Recent Labs  11/19/16 0528  WBC 9.4  HGB 11.2*  HCT 35.7*  PLT 274    Recent Labs  11/19/16 0528  NA 136  K 3.8  CL 103  GLUCOSE 114*  BUN 7  CREATININE 0.75  CALCIUM 9.3   CBG (last 3)  No results for input(s): GLUCAP in the last 72 hours.  Wt Readings from Last 3 Encounters:  11/21/16 96.4 kg (212 lb 8 oz)  10/28/16 97.7 kg (215 lb 6.2 oz)    Physical Exam:  BP 119/78 (BP Location: Left Arm)   Pulse 70   Temp 98.6 F (37 C) (Oral)   Resp 16   Ht  (1.778 m)   Wt 96.4 kg (212 lb 8 oz)   SpO2 97%   BMI 30.49 kg/m  Constitutional: He appears well-developed and well-nourished. No distress.  HENT: Normocephalic and atraumatic.  Eyes: EOM are normal. No discharge.  Cardiovascular: RRR without murmur. No JVD  Respiratory: CTA Bilaterally without wheezes or rales. Normal effort  GI: Bowel sounds are normal. He exhibits no distension.  Musculoskeletal: He exhibits no edema, no tenderness Neurological: He is alert and oriented.  Dysphonia Improving.  Right facial weakness.  Verbose.   RUE: Shoulder abduction 3-/5, elbow extension/flexion 3-/5, hand grip tr-- 1/5 (gradually improving) RLE: 4/5 HF, KE, 3-/5 ADF/PF (stable) LUE/LLE: 5/5 proximal to distal Skin: Skin is warm and dry. He is not diaphoretic.  Psychiatric: He has a normal mood and affect. His behavior is normal. Thought content normal.   Assessment/Plan: 1. Functional deficits secondary to left medullary infarct, bilateral cerebellar infarcts which require 3+ hours per day of  interdisciplinary therapy in a comprehensive inpatient rehab setting. Physiatrist is providing close team supervision and 24 hour management of active medical problems listed below. Physiatrist and rehab team continue to assess barriers to discharge/monitor patient progress toward functional and medical goals.  Function:  Bathing Bathing position   Position: Wheelchair/chair at sink  Bathing parts Body parts bathed by patient: Right arm, Chest, Abdomen, Front perineal area, Buttocks, Right upper leg, Left upper leg, Right lower leg, Left lower leg Body parts bathed by helper: Back, Left arm  Bathing assist Assist Level: Touching or steadying assistance(Pt > 75%)      Upper Body Dressing/Undressing Upper body dressing   What is the patient wearing?: Pull over shirt/dress     Pull over shirt/dress - Perfomed by patient: Thread/unthread left sleeve, Put head through opening, Pull shirt over trunk, Thread/unthread right sleeve Pull over shirt/dress - Perfomed by helper: Thread/unthread right sleeve        Upper body assist Assist Level: Supervision or verbal cues      Lower Body Dressing/Undressing Lower body dressing   What is the patient wearing?: Pants, Socks, Shoes, AFO     Pants- Performed by patient: Thread/unthread right pants leg, Thread/unthread left pants leg, Pull pants up/down Pants- Performed by helper: Pull pants up/down Non-skid slipper socks- Performed by patient: Don/doff right sock, Don/doff left sock Non-skid slipper socks- Performed by helper: Don/doff right sock, Don/doff  left sock Socks - Performed by patient: Don/doff right sock, Don/doff left sock Socks - Performed by helper: Don/doff right sock, Don/doff left sock Shoes - Performed by patient: Don/doff right shoe, Don/doff left shoe, Fasten right, Fasten left Shoes - Performed by helper: Don/doff right shoe AFO - Performed by patient: Don/doff right AFO AFO - Performed by helper: Don/doff right AFO       Lower body assist Assist for lower body dressing: Touching or steadying assistance (Pt > 75%)      Toileting Toileting   Toileting steps completed by patient: Performs perineal hygiene Toileting steps completed by helper: Adjust clothing prior to toileting, Adjust clothing after toileting Toileting Assistive Devices: Grab bar or rail  Toileting assist Assist level: Touching or steadying assistance (Pt.75%)   Transfers Chair/bed transfer Chair/bed transfer activity did not occur: N/A Chair/bed transfer method: Squat pivot Chair/bed transfer assist level: Touching or steadying assistance (Pt > 75%) Chair/bed transfer assistive device: Armrests, Walker Mechanical lift: Landscape architect Ambulation activity did not occur: Safety/medical concerns (R hemiplegia w/ 0/5 MMT globally)   Max distance: 30' Assist level: Touching or steadying assistance (Pt > 75%)   Wheelchair   Type: Manual Max wheelchair distance: 150' Assist Level: Supervision or verbal cues  Cognition Comprehension Comprehension assist level: Follows complex conversation/direction with extra time/assistive device  Expression Expression assist level: Expresses complex ideas: With extra time/assistive device  Social Interaction Social Interaction assist level: Interacts appropriately with others with medication or extra time (anti-anxiety, antidepressant).  Problem Solving Problem solving assist level: Solves complex problems: Recognizes & self-corrects  Memory Memory assist level: Recognizes or recalls 90% of the time/requires cueing < 10% of the time    Medical Problem List and Plan: 1.  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   Cont CIR PT, OT  WHO/PRAFO  2.  DVT Prophylaxis/Anticoagulation: Pharmaceutical: Lovenox d/ced due to bruising and penile bleeding 3. HA/Pain Management: Continue tylenol prn for HA.  4. Mood: LCSW to follow for  evaluation and support. Team to provide ego support.  5. Neuropsych: This patient is capable of making decisions on his own behalf. 6. Skin/Wound Care: routine pressure relief measures.  7. Fluids/Electrolytes/Nutrition: Monitor I/Os 8. HTN: Monitor BP bid--continue lasix and aldactone.   Controlled 9/21 Vitals:   11/20/16 1551 11/21/16 0500  BP: 118/78 119/78  Pulse: 89 70  Resp:  16  Temp: 98.6 F (37 C) 98.6 F (37 C)  SpO2: 98% 97%   9. Cirrhosis of liver: has been set up with GI at [facility]. New diagnosis post decompensation. Resumed low salt restrictions with lasix and aldactone.   Decreased lactulose again on 9/11. ?DC  Will continue to monitor for encephalopathy.  Monitor weights daily. 2 gram tylenol max/day  -weights stable 9. Leucocytosis: Resolved  Monitor for signs of infection  WBCs 9.7 on 9/14  Afebrile  Cont to monitor 10.  Prediabetes: Hgb A1c- 6.1.   No CBG needed 11. ABLA  Hb 11.2 on 9/21  Stable vs 11.1, 1 wk ago  Cont to monitor 12. Hyponatremia  Resolved Na+ 136 on 9/21   Cont to monitor  LOS (Days) 20 A FACE TO FACE EVALUATION WAS PERFORMED  Valary Manahan T 11/21/2016 8:40 AM

## 2016-11-21 NOTE — Progress Notes (Signed)
Occupational Therapy Session Note  Patient Details  Name: Jordan Thompson MRN: [id number] Date of Birth: November 24, 1965  Today's Date: 11/21/2016 OT Individual Time: 0900-1000 OT Individual Time Calculation (min): 60 min    Short Term Goals: Week 3:  OT Short Term Goal 1 (Week 3): Pt will complete LB bathing with min assist sit to stand.  OT Short Term Goal 2 (Week 3): Pt wil complete LB dressing sit to stand with min assist.  OT Short Term Goal 3 (Week 3): Pt will complete toilet transfers stand pivot with min assist.  OT Short Term Goal 4 (Week 3): Pt will complete shower transfers stand pivot with min assist.   OT Short Term Goal 5 (Week 3): Pt will use the RUE as a stabilizer for selfcare tasks with mod assist.   Skilled Therapeutic Interventions/Progress Updates:    Pt completed bathing and dressing during session.  Min assist for stand pivot transfer to the left to the shower, with use of grab bar on the left as well.  Min assist for all bathing sit to stand with max hand over hand to incorporate the RUE for washing the left arm.  He was able to dry off and then ambulate to the elevated toilet a short distance with use of the grab bars on the left and mod assist.  Transferred to the wheelchair from toilet and out in front of the sink for dressing and grooming tasks.  Mod independent for combing hair and brushing teeth from wheelchair level.  He was able to [employer] with supervision, but needed 3 attempts secondary to not starting it correctly. He needed min assist for crossing and maintaining the RLE up on the left knee for donning LB clothing including AFO.  Min assist for sit to stand and standing balance in order to pull shorts over hips.  Finished session with pt in wheelchair with call button and phone in reach.      Therapy Documentation Precautions:  Precautions Precautions: Fall Precaution Comments: R hemi Restrictions Weight Bearing Restrictions: No   Pain: Pain  Assessment Pain Assessment: No/denies pain Pain Type: Acute pain Pain Intervention(s): Repositioned ADL: See Function Navigator for Current Functional Status.   Therapy/Group: Individual Therapy  Hafsa Lohn OTR/L 11/21/2016, 12:38 PM

## 2016-11-22 ENCOUNTER — Inpatient Hospital Stay (HOSPITAL_COMMUNITY): Payer: Self-pay

## 2016-11-22 ENCOUNTER — Inpatient Hospital Stay (HOSPITAL_COMMUNITY): Payer: Self-pay | Admitting: Occupational Therapy

## 2016-11-22 NOTE — Progress Notes (Signed)
Occupational Therapy Session Note  Patient Details  Name: Jordan Thompson MRN: [id number] Date of Birth: 1965/04/06  Today's Date: 11/22/2016 OT Individual Time: 4098-1191 OT Individual Time Calculation (min): 89 min    Short Term Goals: Week 3:  OT Short Term Goal 1 (Week 3): Pt will complete LB bathing with min assist sit to stand.  OT Short Term Goal 2 (Week 3): Pt wil complete LB dressing sit to stand with min assist.  OT Short Term Goal 3 (Week 3): Pt will complete toilet transfers stand pivot with min assist.  OT Short Term Goal 4 (Week 3): Pt will complete shower transfers stand pivot with min assist.   OT Short Term Goal 5 (Week 3): Pt will use the RUE as a stabilizer for selfcare tasks with mod assist.   Skilled Therapeutic Interventions/Progress Updates:    Pt received sitting up in w/c ready for OT tx session. Focus of session on ADL retraining, functional mobility transfers, and RUE NMR. Pt completed bathing and dressing ADLs from w/c level at sink with Min steady assist during standing portions of activity, HOH assist to wash LUE with RUE, assist to wash back. Pt utilized hemi techniques for UB dressing ADLs with setup assist, minA to support RLE in figure 4 position when donning R shoe/R AFO. Pt completed stand pivot transfer w/c<>toilet using grab bar with MinA, Min steady assist provided during clothing management. Pt then self propelled w/c to rehab apartment for further practice of toilet transfers with simulated home environment in preparation for d/c home. Pt completed stand pivot transfer w/c<>edge of tub bench (simulating distance from w/c<>toilet at home) with use of hemiwalker and with MinA-close MinGuard throughout, completing x2 rounds. Min verbal cues for hemiwalker/LE placement prior to transfer completion. Pt returned to room in manner described above where Pt engaged in seated RUE NMR activity of towel pushes with facilitation for ensuring proper body mechanics and  to decrease compensatory movements during activity. Pt left seated in w/c, call bell and needs within reach.   Therapy Documentation Precautions:  Precautions Precautions: Fall Precaution Comments: R hemi Restrictions Weight Bearing Restrictions: No   Pain: Pain Assessment Pain Assessment: No/denies pain       See Function Navigator for Current Functional Status.   Therapy/Group: Individual Therapy  Orlando Penner 11/22/2016, 3:12 PM

## 2016-11-22 NOTE — Progress Notes (Signed)
Physical Therapy Session Note  Patient Details  Name: Jordan Thompson MRN: [id number] Date of Birth: January 29, 1966  Today's Date: 11/22/2016 PT Individual Time: 1000-1100, 1615-1700 PT Individual Time Calculation (min): 60 min, 45 min    Short Term Goals: Week 3:  PT Short Term Goal 1 (Week 3): STG=LTG due to ELOS  Skilled Therapeutic Interventions/Progress Updates:    Session 1: Pt sitting in w/c upon PT arrival, agreeable to therapy tx and denies pain. Pt propelled w/c to/from the gym x 150 ft with supervision. Pt ambulated 2 x 40 ft using RW, R AFO and min assist with verbal and tactile cues for R knee extension during stance. Pt practiced anteriolateral weightshift over R LE in a staggered stance with R foot forward and lifting heel of L LE 2 x 10 to focus on stance phase of gait. Pt performed 2 x 10 terminal knee extension in standing with RW using the level 1 TB for R LE NMR. PT left seated in w/c with needs in reach at end of session.   Session 2:  Pt seated in w/c  upon PT arrival, agreeable to therapy tx and denies pain. Pt propelled w/c to/from the gym x 150 ft with supervision. Pt practiced simulated bathroom transfer using hemi walker and stand pivot transfer with min assist. Pt ambulated using quad cane and mod assist x 10 ft in order to focus on decreased reliance of L UE and to challenge dynamic balance. Pt practiced stepping forward/backward in place with L LE 2 x 10 while using QC for UE support with focus on stance phase of R LE, weightshift over R LE and maintaining R LE hip/knee extension, min assist. Pt transferred from w/c to recliner, squat pivot with supervision. Pt left seated in recliner with needs in reach.   Therapy Documentation Precautions:  Precautions Precautions: Fall Precaution Comments: R hemi Restrictions Weight Bearing Restrictions: No   See Function Navigator for Current Functional Status.   Therapy/Group: Individual Therapy  Cresenciano Genre,  PT, DPT 11/22/2016, 10:35 AM

## 2016-11-22 NOTE — Progress Notes (Signed)
[place] PHYSICAL MEDICINE & REHABILITATION     PROGRESS NOTE  Subjective/Complaints:  Pt seen sitting up in his chair this AM, having finished breakfast.  He slept well overnight.  He states he had a "boring weekend", but is ready to get back to a full day of therapies.  He has several questions regarding discharge.   ROS: Denies nausea, vomiting, diarrhea, shortness of breath or chest pain   Objective: Vital Signs: Blood pressure 117/81, pulse 72, temperature 98.7 F (37.1 C), temperature source Oral, resp. rate 16, height  (1.778 m), weight 96.4 kg (212 lb 8 oz), SpO2 96 %. No results found. No results for input(s): WBC, HGB, HCT, PLT in the last 72 hours. No results for input(s): NA, K, CL, GLUCOSE, BUN, CREATININE, CALCIUM in the last 72 hours.  Invalid input(s): CO CBG (last 3)  No results for input(s): GLUCAP in the last 72 hours.  Wt Readings from Last 3 Encounters:  11/21/16 96.4 kg (212 lb 8 oz)  10/28/16 97.7 kg (215 lb 6.2 oz)    Physical Exam:  BP 117/81 (BP Location: Left Arm)   Pulse 72   Temp 98.7 F (37.1 C) (Oral)   Resp 16   Ht  (1.778 m)   Wt 96.4 kg (212 lb 8 oz)   SpO2 96%   BMI 30.49 kg/m  Constitutional: He appears well-developed and well-nourished. No distress.  HENT: Normocephalic and atraumatic.  Eyes: EOM are normal. No discharge.  Cardiovascular: RRR. No JVD  Respiratory: CTA Bilaterally. Normal effort  GI: Bowel sounds are normal. He exhibits no distension.  Musculoskeletal: He exhibits no edema, no tenderness Neurological: He is alert and oriented.  Dysphonia Improving.  Right facial weakness.  Verbose.   RUE: Shoulder abduction 3/5, elbow extension/flexion 3-/5, hand grip 1/5  RLE: 4/5 HF, KE, 3/5 ADF/PF  LUE/LLE: 5/5 proximal to distal Skin: Skin is warm and dry. He is not diaphoretic.  Psychiatric: He has a normal mood and affect. His behavior is normal. Thought content normal.   Assessment/Plan: 1. Functional  deficits secondary to left medullary infarct, bilateral cerebellar infarcts which require 3+ hours per day of interdisciplinary therapy in a comprehensive inpatient rehab setting. Physiatrist is providing close team supervision and 24 hour management of active medical problems listed below. Physiatrist and rehab team continue to assess barriers to discharge/monitor patient progress toward functional and medical goals.  Function:  Bathing Bathing position   Position: Wheelchair/chair at sink  Bathing parts Body parts bathed by patient: Right arm, Chest, Abdomen, Front perineal area, Buttocks, Right upper leg, Left upper leg, Right lower leg, Left lower leg Body parts bathed by helper: Left arm  Bathing assist Assist Level: Touching or steadying assistance(Pt > 75%)      Upper Body Dressing/Undressing Upper body dressing   What is the patient wearing?: Pull over shirt/dress     Pull over shirt/dress - Perfomed by patient: Thread/unthread left sleeve, Put head through opening, Pull shirt over trunk, Thread/unthread right sleeve Pull over shirt/dress - Perfomed by helper: Thread/unthread right sleeve        Upper body assist Assist Level: Supervision or verbal cues      Lower Body Dressing/Undressing Lower body dressing   What is the patient wearing?: Pants, Socks, Shoes, AFO     Pants- Performed by patient: Thread/unthread right pants leg, Thread/unthread left pants leg, Pull pants up/down Pants- Performed by helper: Pull pants up/down Non-skid slipper socks- Performed by patient: Don/doff right sock,  Don/doff left sock Non-skid slipper socks- Performed by helper: Don/doff right sock, Don/doff left sock Socks - Performed by patient: Don/doff right sock, Don/doff left sock Socks - Performed by helper: Don/doff right sock, Don/doff left sock Shoes - Performed by patient: Don/doff right shoe, Don/doff left shoe, Fasten right, Fasten left Shoes - Performed by helper: Don/doff right  shoe AFO - Performed by patient: Don/doff right AFO AFO - Performed by helper: Don/doff right AFO      Lower body assist Assist for lower body dressing: Touching or steadying assistance (Pt > 75%)      Toileting Toileting   Toileting steps completed by patient: Performs perineal hygiene, Adjust clothing prior to toileting, Adjust clothing after toileting Toileting steps completed by helper: Adjust clothing prior to toileting, Adjust clothing after toileting Toileting Assistive Devices: Grab bar or rail  Toileting assist Assist level: Touching or steadying assistance (Pt.75%)   Transfers Chair/bed transfer Chair/bed transfer activity did not occur: N/A Chair/bed transfer method: Squat pivot Chair/bed transfer assist level: Touching or steadying assistance (Pt > 75%) Chair/bed transfer assistive device: Armrests, Walker Mechanical lift: Landscape architect Ambulation activity did not occur: Safety/medical concerns (R hemiplegia w/ 0/5 MMT globally)   Max distance: 30' Assist level: Touching or steadying assistance (Pt > 75%)   Wheelchair   Type: Manual Max wheelchair distance: 150' Assist Level: Supervision or verbal cues  Cognition Comprehension Comprehension assist level: Follows complex conversation/direction with extra time/assistive device  Expression Expression assist level: Expresses complex ideas: With extra time/assistive device  Social Interaction Social Interaction assist level: Interacts appropriately with others with medication or extra time (anti-anxiety, antidepressant).  Problem Solving Problem solving assist level: Solves complex problems: Recognizes & self-corrects  Memory Memory assist level: Recognizes or recalls 90% of the time/requires cueing < 10% of the time    Medical Problem List and Plan: 1.  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   Cont CIR  WHO/PRAFO   Notes reviewed 2.  DVT  Prophylaxis/Anticoagulation: Pharmaceutical: Lovenox d/ced due to bruising and penile bleeding 3. HA/Pain Management: Continue tylenol prn for HA.  4. Mood: LCSW to follow for evaluation and support. Team to provide ego support.  5. Neuropsych: This patient is capable of making decisions on his own behalf. 6. Skin/Wound Care: routine pressure relief measures.  7. Fluids/Electrolytes/Nutrition: Monitor I/Os 8. HTN: Monitor BP bid--continue lasix and aldactone.   Controlled 9/24 Vitals:   11/21/16 1500 11/22/16 0628  BP: 122/72 117/81  Pulse: 70 72  Resp: 16 16  Temp: 97.7 F (36.5 C) 98.7 F (37.1 C)  SpO2: 98% 96%   9. Cirrhosis of liver: has been set up with GI at [facility]. New diagnosis post decompensation. Resumed low salt restrictions with lasix and aldactone.   Decreased lactulose again on 9/11. ?DC  Will continue to monitor for encephalopathy.  Monitor weights daily. 2 gram tylenol max/day  -weights stable 9. Leucocytosis: Resolved  Monitor for signs of infection  WBCs 9.7 on 9/14  Afebrile  Cont to monitor 10.  Prediabetes: Hgb A1c- 6.1.   No CBG needed 11. ABLA  Hb 11.2 on 9/21  Cont to monitor 12. Hyponatremia: Resolved  Cont to monitor  LOS (Days) 21 A FACE TO FACE EVALUATION WAS PERFORMED  Ankit Karis Juba 11/22/2016 9:03 AM

## 2016-11-23 ENCOUNTER — Inpatient Hospital Stay (HOSPITAL_COMMUNITY): Payer: Self-pay

## 2016-11-23 ENCOUNTER — Inpatient Hospital Stay (HOSPITAL_COMMUNITY): Payer: Self-pay | Admitting: Physical Therapy

## 2016-11-23 ENCOUNTER — Inpatient Hospital Stay (HOSPITAL_COMMUNITY): Payer: Self-pay | Admitting: Occupational Therapy

## 2016-11-23 NOTE — Progress Notes (Signed)
Occupational Therapy Session Note  Patient Details  Name: Jordan Thompson MRN: [id number] Date of Birth: 03-23-1965  Today's Date: 11/23/2016 OT Individual Time: 1001-1100 OT Individual Time Calculation (min): 59 min    Short Term Goals: Week 3:  OT Short Term Goal 1 (Week 3): Pt will complete LB bathing with min assist sit to stand.  OT Short Term Goal 2 (Week 3): Pt wil complete LB dressing sit to stand with min assist.  OT Short Term Goal 3 (Week 3): Pt will complete toilet transfers stand pivot with min assist.  OT Short Term Goal 4 (Week 3): Pt will complete shower transfers stand pivot with min assist.   OT Short Term Goal 5 (Week 3): Pt will use the RUE as a stabilizer for selfcare tasks with mod assist.   Skilled Therapeutic Interventions/Progress Updates:    OT treatment session focused on R NMR, NMES, modified bathing/dressing. UB bathing/dressing completed at the sink. Provided elbow support and joint input to R UE while washing hair in cap and promoted triceps and biceps to assist washing hair. Continued to integrate R UE to wash L UE and abdomen with elbow support. Pt donned shirt with set-up A to orient shirt using hemi-techniques. Pt propelled wc to therapy gym for NMR and NMES. Pt transferred to therapy mat on L side with close supervision. Worked on triceps extension and wrist extensors with e-stim. Facilitated weight bearing through L UE seated on mat and pt able to activate triceps and straighten elbow. Pt returned to room at end of session and left seated in wc with needs met.   Therapy Documentation Precautions:  Precautions Precautions: Fall Precaution Comments: R hemi Restrictions Weight Bearing Restrictions: No Pain: Pain Assessment Pain Assessment: No/denies pain Pain Score: 0-No pain  See Function Navigator for Current Functional Status.   Therapy/Group: Individual Therapy  Valma Cava 11/23/2016, 10:31 AM

## 2016-11-23 NOTE — Progress Notes (Signed)
Physical Therapy Session Note  Patient Details  Name: Jordan Thompson MRN: [id number] Date of Birth: 1965/12/30  Today's Date: 11/23/2016 PT Individual Time:0800-0900, 6578-4696 PT Individual Time Calculation (min): 60 min,45 min   Short Term Goals: Week 3:  PT Short Term Goal 1 (Week 3): STG=LTG due to ELOS  Skilled Therapeutic Interventions/Progress Updates:    Session 1: Pt seated in w/c upon PT arrival, agreeable to therapy tx and denies pain. Seated in w/c pt donned socks and shoes with supervision and AFO with min assist to adjust straps. Pt propelled w/c to the gym with supervision x 150 ft. PT ambulated x 42 ft using RW and min assist, tactile and verbal cues for R knee and hip extension during stance and foot placement. Pt seated on cybex kinetron worked on pushing down with R LE for hip/knee extension while holding L LE off pedal, on 10 cm/sec 3 x 1 min. Pt performed 2 x 5 sit to stands on cybex kinetron with focus on symmetric weightbearing, no UE support and requiring mod assist. In standing on cybex kinetron pt performed marches alternating hip/knee extension 3 x 1 min, without verbal cues and with mod assist. Pt transported back to room and left seated in w/c with needs in reach.   Session 2: Pt seated in w/c upon PT arrival, agreeable to therapy tx and denies pain. Pt propelled w/c to the gym with supervision x 150 ft. Pt transferred from sitting in w/c>standing>tall kneeling on mat with mod assist. Pt worked on glute activation and lateral weightshifts over each LE while in tall kneeling, also working on maintaining balance without UE support x 5 min. Pt transferred tall kneeling>modified quadruped with elbow on bench, pt performed x5 L LE hip extensions to encourage weightbearing through R LE and UE. Pt ascended/descended 4 steps using L handrail with max assist step to pattern, verbal cues for technique (pt did become anxious at the top of the stairs, provided emotional support  and pt was able to calm). Pt transported back to room in w/c, transferred to recliner with supervision. Pt left seated in recliner with needs in reach.   Therapy Documentation Precautions:  Precautions Precautions: Fall Precaution Comments: R hemi Restrictions Weight Bearing Restrictions: No   See Function Navigator for Current Functional Status.   Therapy/Group: Individual Therapy  Cresenciano Genre, PT, DPT 11/23/2016, 8:36 AM

## 2016-11-23 NOTE — Progress Notes (Signed)
Physical Therapy Session Note  Patient Details  Name: Jordan Thompson MRN: [id number] Date of Birth: [birth year]  Today's Date: 11/23/2016 PT Individual Time: 1415-1500 PT Individual Time Calculation (min): 45 min   Short Term Goals: Week 3:  PT Short Term Goal 1 (Week 3): STG=LTG due to ELOS  Skilled Therapeutic Interventions/Progress Updates: Pt presented in w/c agreeable to therapy. Propelled to rehab gym supervision. Session focus on R NMR/wt shifting/gait. Pt performed squat pivot to L min guard to mat. Gait training x 59f with RW with minA and tactile cues for TKE and manual facilitation of wt shifting to R. Performed sit to/from stand with LLE on raised platform x 5, hip flexion LLE x 10 for increased wt bearing on RLE. Pt required intermittent rest breaks due to fatigue. Pt able to demonstrate improved R wt bearing at end of session. Performed stand pivot with R to L to return to w/c and propelled self back to room in same manner as prior. Pt left in w/c with half lap tray placed and call bell within reach with needs met.      Therapy Documentation Precautions:  Precautions Precautions: Fall Precaution Comments: R hemi Restrictions Weight Bearing Restrictions: No General:   Vital Signs: Therapy Vitals Temp: 98.1 F (36.7 C) Temp Source: Oral Pulse Rate: 94 Resp: 17 BP: 114/86 Patient Position (if appropriate): Sitting Oxygen Therapy SpO2: 97 % O2 Device: Not Delivered    See Function Navigator for Current Functional Status.   Therapy/Group: Individual Therapy  Alakai Macbride  Vir Whetstine, PTA  11/23/2016, 4:42 PM

## 2016-11-23 NOTE — Progress Notes (Signed)
Social Work Patient ID: Jordan Thompson, male   DOB: February 23, 1966, 51 y.o.   MRN: [id number] Met with pt to give him the letter for SSD application. Discussed with him trying to find home health or outpatient rehab who will take Medicaid pending. Most want the Medicaid to be approved. Will continue to try in the [facility] area since no facility in [place] will take Medicaid pending.

## 2016-11-23 NOTE — Progress Notes (Signed)
[place] PHYSICAL MEDICINE & REHABILITATION     PROGRESS NOTE  Subjective/Complaints:  Pt slept well overnight.  He is enjoying therapies.    ROS: Denies nausea, vomiting, diarrhea, shortness of breath or chest pain   Objective: Vital Signs: Blood pressure 110/63, pulse 78, temperature 98.3 F (36.8 C), temperature source Oral, resp. rate 16, height  (1.778 m), weight 96.2 kg (212 lb), SpO2 98 %. No results found. No results for input(s): WBC, HGB, HCT, PLT in the last 72 hours. No results for input(s): NA, K, CL, GLUCOSE, BUN, CREATININE, CALCIUM in the last 72 hours.  Invalid input(s): CO CBG (last 3)  No results for input(s): GLUCAP in the last 72 hours.  Wt Readings from Last 3 Encounters:  11/23/16 96.2 kg (212 lb)  10/28/16 97.7 kg (215 lb 6.2 oz)    Physical Exam:  BP 110/63 (BP Location: Left Arm)   Pulse 78   Temp 98.3 F (36.8 C) (Oral)   Resp 16   Ht  (1.778 m)   Wt 96.2 kg (212 lb)   SpO2 98%   BMI 30.42 kg/m  Constitutional: He appears well-developed and well-nourished. No distress.  HENT: Normocephalic and atraumatic.  Eyes: EOM are normal. No discharge.  Cardiovascular: RRR. No JVD  Respiratory: CTA Bilaterally. Normal effort. GI: Bowel sounds are normal. He exhibits no distension.  Musculoskeletal: He exhibits no edema, no tenderness Neurological: He is alert and oriented.  Dysphonia Improving.  Right facial weakness.  Verbose.   RUE: Shoulder abduction 3/5, elbow extension/flexion 3-/5, hand grip 1/5 (stable) RLE: 4/5 HF, KE, 3/5 ADF/PF  LUE/LLE: 5/5 proximal to distal Skin: Skin is warm and dry. He is not diaphoretic.  Psychiatric: He has a normal mood and affect. His behavior is normal. Thought content normal.   Assessment/Plan: 1. Functional deficits secondary to left medullary infarct, bilateral cerebellar infarcts which require 3+ hours per day of interdisciplinary therapy in a comprehensive inpatient rehab  setting. Physiatrist is providing close team supervision and 24 hour management of active medical problems listed below. Physiatrist and rehab team continue to assess barriers to discharge/monitor patient progress toward functional and medical goals.  Function:  Bathing Bathing position   Position: Wheelchair/chair at sink  Bathing parts Body parts bathed by patient: Right arm, Chest, Abdomen, Front perineal area, Buttocks, Right upper leg, Left upper leg, Right lower leg, Left lower leg Body parts bathed by helper: Left arm, Back  Bathing assist Assist Level: Touching or steadying assistance(Pt > 75%)      Upper Body Dressing/Undressing Upper body dressing   What is the patient wearing?: Pull over shirt/dress     Pull over shirt/dress - Perfomed by patient: Thread/unthread left sleeve, Put head through opening, Pull shirt over trunk, Thread/unthread right sleeve Pull over shirt/dress - Perfomed by helper: Thread/unthread right sleeve        Upper body assist Assist Level: Supervision or verbal cues      Lower Body Dressing/Undressing Lower body dressing   What is the patient wearing?: Socks, Shoes, AFO     Pants- Performed by patient: Thread/unthread right pants leg, Thread/unthread left pants leg, Pull pants up/down Pants- Performed by helper: Pull pants up/down Non-skid slipper socks- Performed by patient: Don/doff right sock, Don/doff left sock Non-skid slipper socks- Performed by helper: Don/doff right sock, Don/doff left sock Socks - Performed by patient: Don/doff right sock, Don/doff left sock Socks - Performed by helper: Don/doff right sock, Don/doff left sock Shoes - Performed by  patient: Don/doff right shoe, Don/doff left shoe, Fasten right, Fasten left Shoes - Performed by helper: Don/doff right shoe AFO - Performed by patient: Don/doff right AFO AFO - Performed by helper: Don/doff right AFO      Lower body assist Assist for lower body dressing: Touching or  steadying assistance (Pt > 75%)      Toileting Toileting   Toileting steps completed by patient: Adjust clothing prior to toileting, Adjust clothing after toileting Toileting steps completed by helper: Adjust clothing prior to toileting, Adjust clothing after toileting Toileting Assistive Devices: Grab bar or rail  Toileting assist Assist level: Touching or steadying assistance (Pt.75%)   Transfers Chair/bed transfer Chair/bed transfer activity did not occur: N/A Chair/bed transfer method: Squat pivot Chair/bed transfer assist level: Supervision or verbal cues Chair/bed transfer assistive device: Armrests Mechanical lift: Stedy   Locomotion Ambulation Ambulation activity did not occur: Safety/medical concerns (R hemiplegia w/ 0/5 MMT globally)   Max distance: 63ft Assist level: Touching or steadying assistance (Pt > 75%)   Wheelchair   Type: Manual Max wheelchair distance: 150' Assist Level: Supervision or verbal cues  Cognition Comprehension Comprehension assist level: Follows complex conversation/direction with no assist  Expression Expression assist level: Expresses complex ideas: With no assist  Social Interaction Social Interaction assist level: Interacts appropriately with others - No medications needed.  Problem Solving Problem solving assist level: Solves complex problems: Recognizes & self-corrects  Memory Memory assist level: Complete Independence: No helper    Medical Problem List and Plan: 1.  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   Cont CIR  WHO/PRAFO  2.  DVT Prophylaxis/Anticoagulation: Pharmaceutical: Lovenox d/ced due to bruising and penile bleeding 3. HA/Pain Management: Continue tylenol prn for HA.  4. Mood: LCSW to follow for evaluation and support. Team to provide ego support.  5. Neuropsych: This patient is capable of making decisions on his own behalf. 6. Skin/Wound Care: routine pressure  relief measures.  7. Fluids/Electrolytes/Nutrition: Monitor I/Os 8. HTN: Monitor BP bid--continue lasix and aldactone.   Controlled 9/25 Vitals:   11/22/16 1342 11/23/16 0534  BP: 113/81 110/63  Pulse: 82 78  Resp: 17 16  Temp: 97.7 F (36.5 C) 98.3 F (36.8 C)  SpO2: 97% 98%   9. Cirrhosis of liver: has been set up with GI at [facility]. New diagnosis post decompensation. Resumed low salt restrictions with lasix and aldactone.   Decreased lactulose again on 9/11, d/ced 9/25.   Will continue to monitor for encephalopathy.  Monitor weights daily. 2 gram tylenol max/day 9. Leucocytosis: Resolved  Monitor for signs of infection  WBCs 9.7 on 9/14  Afebrile  Cont to monitor 10.  Prediabetes: Hgb A1c- 6.1.   No CBG needed 11. ABLA  Hb 11.2 on 9/21  Cont to monitor 12. Hyponatremia: Resolved  Cont to monitor  LOS (Days) 22 A FACE TO FACE EVALUATION WAS PERFORMED  Ankit Karis Juba 11/23/2016 8:44 AM

## 2016-11-24 ENCOUNTER — Inpatient Hospital Stay (HOSPITAL_COMMUNITY): Payer: Medicaid Other | Admitting: Occupational Therapy

## 2016-11-24 ENCOUNTER — Inpatient Hospital Stay (HOSPITAL_COMMUNITY): Payer: Self-pay

## 2016-11-24 ENCOUNTER — Inpatient Hospital Stay (HOSPITAL_COMMUNITY): Payer: Self-pay | Admitting: *Deleted

## 2016-11-24 MED ORDER — ASPIRIN 81 MG PO TBEC: 81.0000 mg | DELAYED_RELEASE_TABLET | Freq: Every day | ORAL | Status: AC

## 2016-11-24 MED ORDER — ATORVASTATIN CALCIUM 40 MG PO TABS
40.0000 mg | ORAL_TABLET | Freq: Every day | ORAL | Status: DC
  Administered 2016-11-24 – 2016-11-26 (×3): 40 mg via ORAL
  Filled 2016-11-24 (×4): qty 1

## 2016-11-24 NOTE — Progress Notes (Signed)
Physical Therapy Session Note  Patient Details  Name: Jordan Thompson MRN: [id number] Date of Birth: 1965-09-15  Today's Date: 11/24/2016 PT Individual Time:1000-1100, 6962-9528 PT Individual Time Calculation (min): 60 min, 45 min   Short Term Goals: Week 3:  PT Short Term Goal 1 (Week 3): STG=LTG due to ELOS  Skilled Therapeutic Interventions/Progress Updates:    Session 1: Pt sitting in w/c upon PT arrival, agreeable to therapy tx and denies pain. Pt propelled w/c to the gym x 150 ft with supervision. Pt ascended/descended 4 steps with mod assist using L handrail and step to pattern, verbal cues for technique. Pt transferred from w/c <> mat with supervision squat pivot. Pt performed lateral step ups with R LE on 2 inch step for R hip and knee extension, only able to lift L heel off ground with UE support using RW. Pt performed floor transfer from mat<>floor with mod assist, verbal cues for technique and sequencing. While on the floor pt maintained quadruped position x 3 minutes with emphasis on shifting over R side for R UE/LE weightbearing. Pt left seated in w/c at end of session with needs in reach.    Session 2: Pt sitting in w/c upon PT arrival, agreeable to therapy tx and denies pain. Pt propelled w/c x 150 ft to the gym with supervision. Session focused on ambulation with a RW. Pt ambulated 2 x 75 ft using RW and min assist, verbal cues for R hip and knee extension, pt able to maintain without tactile cues this session. Reviewed HEP to include: SLR, LAQ, bridges, quad set, hip abduction, sit<>stands which are to be performed with supervision at home, provided hand out as well. Pt transferred w/c>recliner with supervision. Pt left seated in recliner at end of session with needs in reach.    Therapy Documentation Precautions:  Precautions Precautions: Fall Precaution Comments: R hemi Restrictions Weight Bearing Restrictions: No   See Function Navigator for Current Functional  Status.   Therapy/Group: Individual Therapy  Cresenciano Genre, PT, DPT 11/24/2016, 8:13 AM

## 2016-11-24 NOTE — Discharge Summary (Signed)
Physician Discharge Summary  Patient ID: Jordan Thompson MRN: [id number] DOB/AGE: [birth year] 51 y.o.  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  Labs:  Basic Metabolic Panel: BMP Latest Ref Rng & Units 11/19/2016 11/12/2016 11/09/2016  Glucose 65 - 99 mg/dL 409(W) 119(J) 478(G)  BUN 6 - 20 mg/dL Creatinine 0.61 - 1.24 mg/dL 9.56 2.13 0.86  Sodium 135 - 145 mmol/L 136 134(L) 134(L)  Potassium 3.5 - 5.1 mmol/L 3.8 3.7 3.7  Chloride 101 - 111 mmol/L 103 101 101  CO2 22 - 32 mmol/L Calcium 8.9 - 10.3 mg/dL 9.3 9.4 9.3    CBC: CBC Latest Ref Rng & Units 11/19/2016 11/12/2016 11/09/2016  WBC 4.0 - 10.5 K/uL 9.4 9.7 11.1(H)  Hemoglobin 13.0 - 17.0 g/dL 11.2(L) 11.1(L) 11.5(L)  Hematocrit 39.0 - 52.0 % 35.7(L) 35.7(L) 37.0(L)  Platelets 150 - 400 K/uL 274 252 250    CBG: No results for input(s): GLUCAP in the last 168 hours.   Brief HPI:   Jordan Tamplinis a 51 y.o.malewith pmh of hypertension, hypercholesterolemia, recent decompensation due to alcoholic cirrhosis, L-PCA CVA 07/06/82; who was admitted via  [facility] on 10/27/16 with RUE/RLE weakness and numbness. Neurology was consulted and MRI brain done revealing left medullary infarct, acute and subacute bilateral PICA territory infarcts and late subacute to chronic left medullary infarct. 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 CTA head/neck showed occluded L-VA  with thrombus extending into central VBJ- at risk for embolization as well as incidental severe right C6-7 foraminal narrowing. He was started on IV heparin and neurology recommended transition to DPT for 3 months followed by Plavix alone as stable on heparin. Patient with resulted right-sided weakness and mild dysarthria.  Hospital course complicated by leukocytosis, acute blood loss anemia. He used a rolling walker PTA. Lives with his wife who works during the day.    Hospital Course: Jordan Thompson was admitted to rehab 11/01/2016 for inpatient therapies to consist of PT and OT at least three hours five days a week. Past admission physiatrist, therapy team and rehab RN have worked together to provide customized collaborative inpatient rehab. He was mantained on plavix for secondary stroke prevention and has been tolerating this without SE. Serial CBC showed H/H and platelets are stable and leucocytosis has resolved. He did have an episode of bleeding via penis as well as excessive bruising that was felt to be due to lovenox. Therefore this was discontinued and hematuria has resolved.   Lactulose was decreased without mental status changes and  follow up  ammonia levels stable at 25. This was discontinued 9/25 and follow up ammonia levels is down to 22.  Blood pressures have been monitored on bid basis and has been stable on lasix and aldactone.  Renal status is stable and no signs of fluid overload noted and  Mood has improved and patient has continued to make steady progress. He was fitted with R-AFO to help with foot drop and gait quality.  He has progressed to supervision at wheelchair level and will contiue to receive outpatient PT and OT at [facility] after discharge.    Rehab course: During patient's stay in rehab weekly team conferences were held to monitor patient's progress, set goals and discuss barriers to discharge. At admission, patient required total assist with mobility  and mod to total assist with basic self care tasks. Speech therapy evaluation done revealing fluent speech with MoCA score 19/22 and mild dysphonia. He was educated on vocal hygiene and no follow up ST needed during this stay.  Patient has had improvement in activity tolerance, balance, postural control, as well as ability to compensate for deficits. He is has had improvement in functional use RUE  and RLE as well as improved awareness. He requires mod assistance with bathing and is able to perform dressing tasks with supervision. His transfers remain fluctuate from modified independent to requiring supervision. He requires min guard assist with occasional cues to ambulate 62' with R-AFO and RW.  Family education was completed regarding all aspects of care and safety. Family was instructed to provide supervision for first few weeks to help with transition at home.     Disposition: Home  Diet: Heart healthy  Special Instructions: 1. Use lactulose daily as needed for constipation. 2. Need to keep follow up post hospital appointment with primary MD.   Discharge Instructions    Ambulatory referral to Physical Medicine Rehab    Complete by:  As directed    1-2 weeks transitional care     Discharge Medication List as of 11/26/2016  5:24 PM    START taking these medications   Details  aspirin EC 81 MG EC tablet Take 1 tablet (81 mg total) by mouth daily., Starting Thu 11/25/2016, No Print    clopidogrel (PLAVIX) 75 MG tablet Take 1 tablet (75 mg total) by mouth daily., Starting Sat 11/27/2016, Print    Menthol-Methyl Salicylate (MUSCLE RUB) 10-15 % CREA Apply 1 application topically as needed for muscle pain. To left upper abdomen, Starting Fri 11/26/2016, No Print      CONTINUE these medications which have CHANGED   Details  lactulose (CHRONULAC) 10 GM/15ML solution Take 45 mLs (30 g total) by mouth daily as needed (confusion)., Starting Fri 11/26/2016, Print      CONTINUE these medications  which have NOT CHANGED   Details  atorvastatin (LIPITOR) 40 MG tablet Take 40 mg by mouth daily at 6 PM., Historical Med    furosemide (LASIX) 40 MG tablet Take 40 mg by mouth every morning., Historical Med    Multiple Vitamins-Minerals (MULTIVITAMIN PO) Take 1 tablet by mouth daily., Historical Med    pantoprazole (PROTONIX) 40 MG tablet Take 40 mg by mouth every morning., Historical Med    spironolactone (ALDACTONE) 25 MG tablet Take 25 mg by mouth every morning., Historical Med      STOP taking these medications     aspirin 81 MG chewable tablet      carvedilol (COREG) 3.125 MG tablet        Follow-up Information    Georganna Skeans, MD Follow up on 12/02/2016.   Specialty:  Family Medicine Why:  APPOINTMENT @ 2:00 PM Contact information: [address] [facility] [place] [address] [phone]        Marvel Plan, MD. Call in 1 day(s).   Specialty:  Neurology Why:  for appointment in 4 weeks Contact information: [address]  [place] [address] [phone]        [name], MD Follow up.   Specialty:  Physical Medicine and Rehabilitation Why:  office will call you with follow up appointment Contact information: [address] [place] [address] [phone]           Signed: Jacquelynn Cree 11/29/2016, 11:19 AM

## 2016-11-24 NOTE — Progress Notes (Signed)
Recreational Therapy Session Note  Patient Details  Name: Jordan Thompson MRN: [id number] Date of Birth: [birth year] Today's Date: 11/24/2016  Pt participated in animal assisted activity/therapy seated w/c level with supervision. Kyira Volkert 11/24/2016, 2:22 PM

## 2016-11-24 NOTE — Progress Notes (Signed)
[facility] PHYSICAL MEDICINE & REHABILITATION     PROGRESS NOTE  Subjective/Complaints:  Pt seen laying in bed this AM.  He slept well overnight.  He has repeated questions regarding discharge follow up appointments and medications.   ROS: Denies nausea, vomiting, diarrhea, shortness of breath or chest pain   Objective: Vital Signs: Blood pressure 111/67, pulse 78, temperature 98.1 F (36.7 C), temperature source Oral, resp. rate 16, height  (1.778 m), weight 96.3 kg (212 lb 5.5 oz), SpO2 96 %. No results found. No results for input(s): WBC, HGB, HCT, PLT in the last 72 hours. No results for input(s): NA, K, CL, GLUCOSE, BUN, CREATININE, CALCIUM in the last 72 hours.  Invalid input(s): CO CBG (last 3)  No results for input(s): GLUCAP in the last 72 hours.  Wt Readings from Last 3 Encounters:  11/24/16 96.3 kg (212 lb 5.5 oz)  10/28/16 97.7 kg (215 lb 6.2 oz)    Physical Exam:  BP 111/67 (BP Location: Left Arm)   Pulse 78   Temp 98.1 F (36.7 C) (Oral)   Resp 16   Ht  (1.778 m)   Wt 96.3 kg (212 lb 5.5 oz)   SpO2 96%   BMI 30.47 kg/m  Constitutional: He appears well-developed and well-nourished. No distress.  HENT: Normocephalic and atraumatic.  Eyes: EOM are normal. No discharge.  Cardiovascular: RRR. No JVD  Respiratory: CTA Bilaterally. Normal effort. GI: Bowel sounds are normal. He exhibits no distension.  Musculoskeletal: He exhibits no edema, no tenderness Neurological: He is alert and oriented.  Dysphonia Improving.  Right facial weakness.  Verbose.   RUE: Shoulder abduction 3/5, elbow extension/flexion 3-/5, hand grip 1/5 (unchanged) RLE: 4/5 HF, KE, 3/5 ADF/PF (slowly improving) LUE/LLE: 5/5 proximal to distal Skin: Skin is warm and dry. He is not diaphoretic.  Psychiatric: He has a normal mood and affect. His behavior is normal. Thought content normal.   Assessment/Plan: 1. Functional deficits secondary to left medullary infarct, bilateral  cerebellar infarcts which require 3+ hours per day of interdisciplinary therapy in a comprehensive inpatient rehab setting. Physiatrist is providing close team supervision and 24 hour management of active medical problems listed below. Physiatrist and rehab team continue to assess barriers to discharge/monitor patient progress toward functional and medical goals.  Function:  Bathing Bathing position   Position: Wheelchair/chair at sink  Bathing parts Body parts bathed by patient: Right arm, Chest, Abdomen, Left arm Body parts bathed by helper: Left arm, Back  Bathing assist Assist Level: Supervision or verbal cues      Upper Body Dressing/Undressing Upper body dressing   What is the patient wearing?: Pull over shirt/dress     Pull over shirt/dress - Perfomed by patient: Thread/unthread left sleeve, Put head through opening, Pull shirt over trunk, Thread/unthread right sleeve Pull over shirt/dress - Perfomed by helper: Thread/unthread right sleeve        Upper body assist Assist Level: Supervision or verbal cues      Lower Body Dressing/Undressing Lower body dressing   What is the patient wearing?: Socks, Shoes, AFO     Pants- Performed by patient: Thread/unthread right pants leg, Thread/unthread left pants leg, Pull pants up/down Pants- Performed by helper: Pull pants up/down Non-skid slipper socks- Performed by patient: Don/doff right sock, Don/doff left sock Non-skid slipper socks- Performed by helper: Don/doff right sock, Don/doff left sock Socks - Performed by patient: Don/doff right sock, Don/doff left sock Socks - Performed by helper: Don/doff right sock, Don/doff left  sock Shoes - Performed by patient: Don/doff right shoe, Don/doff left shoe, Fasten right, Fasten left Shoes - Performed by helper: Don/doff right shoe AFO - Performed by patient: Don/doff right AFO AFO - Performed by helper: Don/doff right AFO      Lower body assist Assist for lower body dressing:  Touching or steadying assistance (Pt > 75%)      Toileting Toileting   Toileting steps completed by patient: Adjust clothing prior to toileting, Adjust clothing after toileting Toileting steps completed by helper: Adjust clothing prior to toileting, Adjust clothing after toileting Toileting Assistive Devices: Grab bar or rail  Toileting assist Assist level: Touching or steadying assistance (Pt.75%)   Transfers Chair/bed transfer Chair/bed transfer activity did not occur: N/A Chair/bed transfer method: Squat pivot, Stand pivot Chair/bed transfer assist level: Touching or steadying assistance (Pt > 75%) Chair/bed transfer assistive device: Armrests, Walker Mechanical lift: Landscape architect Ambulation activity did not occur: Safety/medical concerns (R hemiplegia w/ 0/5 MMT globally)   Max distance: 42 ft Assist level: Touching or steadying assistance (Pt > 75%)   Wheelchair   Type: Manual Max wheelchair distance: 150' Assist Level: Supervision or verbal cues  Cognition Comprehension Comprehension assist level: Follows complex conversation/direction with no assist  Expression Expression assist level: Expresses complex ideas: With no assist  Social Interaction Social Interaction assist level: Interacts appropriately with others - No medications needed.  Problem Solving Problem solving assist level: Solves complex problems: Recognizes & self-corrects  Memory Memory assist level: Complete Independence: No helper    Medical Problem List and Plan: 1.  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   Cont CIR, continues to make progress and enjoys therapies.  Team conference today  WHO/PRAFO  2.  DVT Prophylaxis/Anticoagulation: Pharmaceutical: Lovenox d/ced due to bruising and penile bleeding 3. HA/Pain Management: Continue tylenol prn for HA.  4. Mood: LCSW to follow for evaluation and support. Team to provide ego  support.  5. Neuropsych: This patient is capable of making decisions on his own behalf. 6. Skin/Wound Care: routine pressure relief measures.  7. Fluids/Electrolytes/Nutrition: Monitor I/Os 8. HTN: Monitor BP bid--continue lasix and aldactone.   Controlled 9/26 Vitals:   11/23/16 1417 11/24/16 0612  BP: 114/86 111/67  Pulse: 94 78  Resp: 17 16  Temp: 98.1 F (36.7 C) 98.1 F (36.7 C)  SpO2: 97% 96%   9. Cirrhosis of liver: has been set up with GI at [facility]. New diagnosis post decompensation. Resumed low salt restrictions with lasix and aldactone.   Decreased lactulose again on 9/11, d/ced 9/25.   Will continue to monitor for encephalopathy.  Monitor weights daily. 2 gram tylenol max/day 9. Leucocytosis: Resolved  Monitor for signs of infection  WBCs 9.7 on 9/14  Afebrile  Cont to monitor 10.  Prediabetes: Hgb A1c- 6.1.   No CBGs needed 11. ABLA  Hb 11.2 on 9/21  Cont to monitor 12. Hyponatremia: Resolved  Cont to monitor  LOS (Days) 23 A FACE TO FACE EVALUATION WAS PERFORMED  Tona Qualley Karis Juba 11/24/2016 7:37 AM

## 2016-11-24 NOTE — Progress Notes (Signed)
Recreational Therapy Discharge Summary Patient Details  Name: Jordan Thompson MRN: [id number] Date of Birth: 1966/02/08 Today's Date: 11/24/2016  Comments on progress toward goals: Pt has made good progress during LOS and is ready for discharge home with family to provide 24 hour supervision/assistance.  TR session focused on use of leisure time, activity analysis with potential modifications & community reintegration.  Pt met supervision goals for w/c level TR tasks/community reintegration on level surfaces and min assist for uneven surfaces.  See outing goal sheet for full details.  Reasons for discharge: discharge from hospital  Patient/family agrees with progress made and goals achieved: Yes  Kvon Mcilhenny 11/24/2016, 8:54 AM

## 2016-11-24 NOTE — Progress Notes (Signed)
Social Work Patient ID: Jordan Thompson, male   DOB: 21-Jan-1966, 51 y.o.   MRN: [id number]  Met with pt to discuss team conference progress toward his goals and discharge Sat 9/29. He is pleased with the progress he has made and wife and mother in-law have been in for education and this has been completed for discharge. Have ordered equipment and still looking for Follow up due to Medicaid pending status. Work on discharge Sat.

## 2016-11-24 NOTE — Patient Care Conference (Signed)
Inpatient RehabilitationTeam Conference and Plan of Care Update Date: 11/24/2016   Time: 8:00 AM    Patient Name: Jordan Thompson      Medical Record Number: [id number]  Date of Birth: [birth year] Sex: Male         Room/Bed: 4M13C/4M13C-01 Payor Info: Payor: MEDICAID PENDING / Plan: MEDICAID PENDING / Product Type: *No Product type* /    Admitting Diagnosis: rehab  Admit Date/Time:  11/01/2016  4:01 PM Admission Comments: No comment available   Primary Diagnosis:  <principal problem not specified> Principal Problem: <principal problem not specified>  Patient Active Problem List   Diagnosis Date Noted  . Hyponatremia   . Penile bleeding   . Right hemiparesis (HCC)   . 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 Adjustment disorder with mixed anxiety and depressed mood   . 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 Right hemiplegia (HCC)   . Vascular headache   . Hypokalemia   . Alcoholic cirrhosis (HCC)   . GERD without esophagitis   . Leukocytosis   . Acute blood loss anemia   . Benign essential HTN   . Hyperlipidemia   . History of CVA (cerebrovascular accident)   . Prediabetes   . Stroke (cerebrum) [facility]) - acute right cerebellar PICA territory and left paramedian medulla infarcts 10/28/2016    Expected Discharge Date: Expected Discharge Date: 11/27/16  Team Members Present: Physician leading conference: Dr. Maryla Morrow Social Worker Present: Dossie Der, LCSW Nurse Present: Allayne Stack, RN PT Present: Woodfin Ganja, PT OT Present: Perrin Maltese, OT SLP Present: Colin Benton, SLP     Current Status/Progress Goal Weekly Team Focus  Medical   Right hemiplegia and dysarthria secondary to left medullary infarct, bilateral cerebellar infarcts.  Improve mobility, transfers  See above   Bowel/Bladder   continent of bowel and bladder. last BM 9/24  remain continent  monitor for  changes in bowel and bladder habits.   Swallow/Nutrition/ Hydration             ADL's   Min assist overall  supervision for grooming and UB selfcare, min to mod assist for LB selfcare and min assist for transfers.  selfcare retraining, balance retraining, transfer training, neuromuscular re-education, NMES, pt/family education   Mobility   supervision squat pivot, min assist stand pivot, min assist gait up to 42 ft RW, supervision w/c  Min assist gait, Mod I squat piv transfers and w/c mobility, supervision stand pivot/car transfers  d/c planning, gait training, R LE NMR   Communication             Safety/Cognition/ Behavioral Observations            Pain   Denies pain this shift.  Pain less than or equal to 2  Assess pain q shift and prn.   Skin   CDI  no skin  breakdown while in rehab.  Monitor skin q shift and prn.      *See Care Plan and progress notes for long and short-term goals.     Barriers to Discharge  Current Status/Progress Possible Resolutions Date Resolved   Physician    Decreased caregiver support;Medical stability;Lack of/limited family support     See above  Therapies, discharge end of the week      Nursing                  PT  OT                  SLP                SW                Discharge Planning/Teaching Needs:    Home with wife and mother in-law-both to provide 24 hr care. Mother in-law while wife is working. Family education completed last Sat     Team Discussion:  Pt progressing toward his goals but will be at supervision-min assist level. Family education completed and trying to find OP or HH to take medicaid pending for services for pt. MD-adjusting HTN meds toward home regimen. Ready for DC Sat  Revisions to Treatment Plan:  DC Sat    Continued Need for Acute Rehabilitation Level of Care: The patient requires daily medical management by a physician with specialized training in physical medicine and rehabilitation for  the following conditions: Daily direction of a multidisciplinary physical rehabilitation program to ensure safe treatment while eliciting the highest outcome that is of practical value to the patient.: Yes Daily medical management of patient stability for increased activity during participation in an intensive rehabilitation regime.: Yes Daily analysis of laboratory values and/or radiology reports with any subsequent need for medication adjustment of medical intervention for : Neurological problems;Other  Lucy Chris 11/24/2016, 8:33 AM

## 2016-11-24 NOTE — Progress Notes (Signed)
Occupational Therapy Session Note  Patient Details  Name: Jordan Thompson MRN: [id number] Date of Birth: 05-Nov-1965  Today's Date: 11/24/2016 OT Individual Time: 1401-1502 OT Individual Time Calculation (min): 61 min    Short Term Goals: Week 4:  OT Short Term Goal 1 (Week 4): Continue working on [employer].  Skilled Therapeutic Interventions/Progress Updates:    Pt practiced simulated toilet transfers with setup made as close as possible to what he will have at home.  He was able to complete 3 transfers on and off of the elevated toilet with rail with supervision.  He needs use of the hemiwalker for transfer to the right when returning back to the chair as he is unsteady using just the grab bar, as he does with transfer to the left to get onto the toilet.  Next had pt roll in the kitchen where he was educated on technique for retrieving items out of the refrigerator with use of the wheelchair.  Min instructional cueing to make sure that he locks the wheelchair brakes.  He reports that he will eat mostly fruit for lunch if he is alone.  Next had pt work on standing at the counter and completing simple movements of shoulder extension with RUE in weightbearing on the surface.  Mod assist for keeping the RUE from adducting with attempted movement.  Min assist for right knee facilitation as it would flex when attempting to push his right arm forward on the surface.  Slight flexor tone beginning in the RUE noted as well.  Educated pt in sitting on shoulder flexion AAROM exercise to be performed at home as well.  Pt able to activate digit flexion this session, which has not been present before.  Educated him on AAROM digit flexion and extension before returning to room to complete session.  Pt left with SW in room to discuss equipment and post rehab follow-up.   Therapy Documentation Precautions:  Precautions Precautions: Fall Precaution Comments: R hemi Restrictions Weight Bearing  Restrictions: No  Pain: Pain Assessment Pain Assessment: No/denies pain ADL: See Function Navigator for Current Functional Status.   Therapy/Group: Individual Therapy  Latiqua Daloia OTR/L 11/24/2016, 4:00 PM

## 2016-11-24 NOTE — Progress Notes (Signed)
Occupational Therapy Weekly Progress Note  Patient Details  Name: Sundiata Ferrick MRN: [id number] Date of Birth: Mar 20, 1965  Beginning of progress report period: November 17, 2016 End of progress report period: November 24, 2016  Today's Date: 11/24/2016 OT Individual Time: 9774-1423 OT Individual Time Calculation (min): 50 min    Patient has met 5 of 5 short term goals.  Mr. Moncus is continuing to make steady progress with OT.  Overall he is at a min assist level for UB bathing with supervision for UB dressing.  LB bathing and dressing are at a min guard assist level with min instructional cueing for technique to remember to position the RLE and RUE and to push off of the wheelchair, instead of attempting to grab and pull on the sink.  He currently demonstrates Brunnstrum stage III movement in the right arm and but stage I in the hand.  Max hand over hand assistance currently to integrate for bathing with mod assist for use as a stabilizer.  He is performing stand pivot transfers with min assist using the RW and AFO on the right foot as well.  Currently with expected discharge on 9/29.  Will continue with current OT POC to meet estabilished LTGs and increase RUE function.   Patient continues to demonstrate the following deficits: muscle weakness, impaired timing and sequencing, unbalanced muscle activation and decreased coordination and decreased standing balance, decreased postural control, hemiplegia and decreased balance strategies and therefore will continue to benefit from skilled OT intervention to enhance overall performance with BADL and Reduce care partner burden.  Patient progressing toward long term goals..  Continue plan of care.  OT Short Term Goals Week 4:  OT Short Term Goal 1 (Week 4): Continue working on [employer].  Skilled Therapeutic Interventions/Progress Updates:    Pt worked on showering and dressing during session.  He was able to complete stand pivot  transfer to the tub bench and out to the wheelchair with min assist using the grab bar.  Bathing overall min assist with max hand over hand to integrate the RUE.  Once finished he completed all dressing sit to stand at the sink with min instructional cueing and supervision.  Grooming tasks performed at wheelchair level with modified independence.  Pt left in wheelchair at end of session with call button and phone in reach.   Therapy Documentation Precautions:  Precautions Precautions: Fall Precaution Comments: R hemi Restrictions Weight Bearing Restrictions: No  Pain: Pain Assessment Pain Assessment: No/denies pain Pain Score: 0-No pain Pain Type: Acute pain Pain Location: Back Pain Orientation: Mid;Lower Pain Descriptors / Indicators: Aching Pain Frequency: Occasional Patients Stated Pain Goal: 2 Pain Intervention(s): Medication (See eMAR) ADL: See Function Navigator for Current Functional Status.   Therapy/Group: Individual Therapy  Chayna Surratt OTR/L 11/24/2016, 10:07 AM

## 2016-11-25 ENCOUNTER — Inpatient Hospital Stay (HOSPITAL_COMMUNITY): Payer: Medicaid Other | Admitting: Occupational Therapy

## 2016-11-25 ENCOUNTER — Inpatient Hospital Stay (HOSPITAL_COMMUNITY): Payer: Self-pay | Admitting: Physical Therapy

## 2016-11-25 ENCOUNTER — Inpatient Hospital Stay (HOSPITAL_COMMUNITY): Payer: Self-pay | Admitting: Occupational Therapy

## 2016-11-25 NOTE — Progress Notes (Signed)
Occupational Therapy Session Note  Patient Details  Name: Halil Rentz MRN: [id number] Date of Birth: [birth year]  Today's Date: 11/25/2016 OT Individual Time: 8657-8469 OT Individual Time Calculation (min): 57 min    Short Term Goals: Week 4:  OT Short Term Goal 1 (Week 4): Continue working on [employer].  Skilled Therapeutic Interventions/Progress Updates:    Pt completed bathing and dressing sit to stand at the sink this session.  Min guard assist for sit to stand from the wheelchair during LB bathing and dressing, in order to wash his peri area and pull pants over hips.  Supervision for UB bathing using hemi technique with supervision for dressing as well.  Completed donning socks, shoes, and AFO with increased time and supervision.  Grooming tasks completed with modified independence from the wheelchair level for brushing his teeth and combing his hair.    Therapy Documentation Precautions:  Precautions Precautions: Fall Precaution Comments: R hemi Restrictions Weight Bearing Restrictions: No  Pain: Pain Assessment Pain Assessment: No/denies pain ADL:  See Function Navigator for Current Functional Status.   Therapy/Group: Individual Therapy  Fryda Molenda OTR/L 11/25/2016, 12:21 PM

## 2016-11-25 NOTE — Progress Notes (Signed)
Physical Therapy Session Note  Patient Details  Name: Jordan Thompson MRN: [id number] Date of Birth: 1965/06/21  Today's Date: 11/25/2016 PT Individual Time: 1000-1057 AND 1610-1643 PT Individual Time Calculation (min): 57 min AND 33 min   Short Term Goals: Week 3:  PT Short Term Goal 1 (Week 3): STG=LTG due to ELOS  Skilled Therapeutic Interventions/Progress Updates:   Session 1:  Pt in w/c upon arrival and agreeable to therapy, no c/o pain. Worked on HEP this session and R HS activation w/ NMES. Pt ambulated 40' w/ RW and RUE splint w/ Min guard and frequent verbal cues for gait pattern. Pt w/ increased R knee flexion throughout R single leg stance and decreased step length. Pt able to correct w/ verbal cues. LE strengthening exercises focused on musculature needed to correct gait impairments listed above. Pt independent w/ performing LAQs, quad sets, and SLRs in supine, reviewed and performed supine bridge and supine abduction. Discussed differences in TE on mat vs more compliant bed surface at home, overall goal is for ms activation. Added supine HS stretch and seated heel slides to HEP. Pt w/ trace R HS activation even in gravity eliminated position, pt will perform isometric knee flexion for heel slides until ms activation improves. NMES to R HS ms in L sidelying, 10 min w/ parameters detailed below. Pt self-propelled w/c back to room w/ supervision, 150', and ended session in w/c, call bell within reach and all needs met.   NMES parameters:  -10 sec on, 10 sec on, 3 sec ramp on/off -aymmetric waveform  -rate 40 -400 pps -intensity @ 20   Session 2:  Pt in w/c upon arrival and agreeable to therapy, no c/o pain. Pt self-propelled w/c to/from day room w/ supervision to work on endurance. Kinetron @ 10 cm/sec both in seated and in standing w/ emphasis on RLE posterior chain ms (HS and glut). Mod A to maintain standing 2/2 balance and manual facilitation of RLE extension. Multiple  seated rest secondary to moderate increase in work of breathing w/ task. Ended session in w/c, call bell within reach and all needs met.    Therapy Documentation Precautions:  Precautions Precautions: Fall Precaution Comments: R hemi Restrictions Weight Bearing Restrictions: No Pain: Pain Assessment Pain Assessment: No/denies pain  See Function Navigator for Current Functional Status.   Therapy/Group: Individual Therapy  Trigger Frasier K Arnette 11/25/2016, 12:19 PM

## 2016-11-25 NOTE — Plan of Care (Signed)
Problem: RH SAFETY Goal: RH STG ADHERE TO SAFETY PRECAUTIONS W/ASSISTANCE/DEVICE STG Adhere to Safety Precautions With mod I Assistance/Device.  Outcome: Progressing  11/25/16 0233  Safety Goals  STG:Pt will adhere to safety precautions with assistance/device 6-Modified independent

## 2016-11-25 NOTE — Progress Notes (Signed)
Occupational Therapy Session Note  Patient Details  Name: Jordan Thompson MRN: [id number] Date of Birth: 1965-05-27  Today's Date: 11/25/2016 OT Individual Time: 1300-1345 OT Individual Time Calculation (min): 45 min    Short Term Goals: Week 4:  OT Short Term Goal 1 (Week 4): Continue working on [employer].  Skilled Therapeutic Interventions/Progress Updates:    Pt seen for OT session focusing on neuro re-ed with R UE. Pt sitting up in w/c upon arrival, agreeable to tx session. Pt excited to show therapist his ability to don R AFO/ shoe without assist now that modifications have been made to AFO strap. He self propelled w/c throughout unit mod I using hemi technique. In therapy gym, completed standing towel pushes at high/low table, reaching in all planes to facilitate weightbearing through R UE/LE and weight shifting. Min A provided for R knee stabilization. Completed x3 trials, pt tolerating ~1 minute each trial before requesting seated rest break.  He completed squat pivot transfers to therapy mat with guarding assist. Completed R UE ROM exercises from supine position focusing on descentric control movements with gravity and anti-gravity movements at elbow and shoulder. Pt without active controlled gross movements in wrist at this time. Education and demonstration completed for self ROM exercises as well. Pt excited with progress made in regards to R UE control. Educated regarding supine exercises for home use and importance of quality vs quantity of exercises. Pt returned to room at end of session propelling w/c at mod I level. Pt left seated in w/c with all needs in reach and RN entering.   Therapy Documentation Precautions:  Precautions Precautions: Fall Precaution Comments: R hemi Restrictions Weight Bearing Restrictions: No Pain:   No/ denies pain  See Function Navigator for Current Functional Status.   Therapy/Group: Individual Therapy  Lewis, Grayce Budden C 11/25/2016,  7:15 AM

## 2016-11-25 NOTE — Progress Notes (Signed)
[name]     PROGRESS NOTE  Subjective/Complaints:   No issues overnite, working with OT  ROS: Denies nausea, vomiting, diarrhea, shortness of breath or chest pain   Objective: Vital Signs: Blood pressure 115/64, pulse 73, temperature 98.7 F (37.1 C), temperature source Oral, resp. rate 18, height  (1.778 m), weight 96.3 kg (212 lb 3.2 oz), SpO2 96 %. No results found. No results for input(s): WBC, HGB, HCT, PLT in the last 72 hours. No results for input(s): NA, K, CL, GLUCOSE, BUN, CREATININE, CALCIUM in the last 72 hours.  Invalid input(s): CO CBG (last 3)  No results for input(s): GLUCAP in the last 72 hours.  Wt Readings from Last 3 Encounters:  11/25/16 96.3 kg (212 lb 3.2 oz)  10/28/16 97.7 kg (215 lb 6.2 oz)    Physical Exam:  BP 115/64 (BP Location: Left Arm)   Pulse 73   Temp 98.7 F (37.1 C) (Oral)   Resp 18   Ht  (1.778 m)   Wt 96.3 kg (212 lb 3.2 oz)   SpO2 96%   BMI 30.45 kg/m  Constitutional: He appears well-developed and well-nourished. No distress.  HENT: Normocephalic and atraumatic.  Eyes: EOM are normal. No discharge.  Cardiovascular: RRR. No JVD  Respiratory: CTA Bilaterally. Normal effort. GI: Bowel sounds are normal. He exhibits no distension.  Musculoskeletal: He exhibits no edema, no tenderness Neurological: He is alert and oriented.  Dysphonia Improving.  Right facial weakness.  Verbose.   RUE: Shoulder abduction 3/5, elbow extension/flexion 3-/5, hand grip 1/5 (unchanged) RLE: 4/5 HF, KE, 3/5 ADF/PF (slowly improving) LUE/LLE: 5/5 proximal to distal Skin: Skin is warm and dry. He is not diaphoretic.  Psychiatric: He has a normal mood and affect. His behavior is normal. Thought content normal.   Assessment/Plan: 1. Functional deficits secondary to left medullary infarct, bilateral cerebellar infarcts which require 3+ hours per day of interdisciplinary therapy in a comprehensive inpatient  rehab setting. Physiatrist is providing close team supervision and 24 hour management of active medical problems listed below. Physiatrist and rehab team continue to assess barriers to discharge/monitor patient progress toward functional and medical goals.  Function:  Bathing Bathing position   Position: Shower  Bathing parts Body parts bathed by patient: Right arm, Chest, Abdomen, Front perineal area, Buttocks, Right upper leg, Left upper leg, Right lower leg, Left lower leg Body parts bathed by helper: Back, Left arm  Bathing assist Assist Level: Supervision or verbal cues      Upper Body Dressing/Undressing Upper body dressing   What is the patient wearing?: Pull over shirt/dress     Pull over shirt/dress - Perfomed by patient: Thread/unthread left sleeve, Put head through opening, Pull shirt over trunk, Thread/unthread right sleeve Pull over shirt/dress - Perfomed by helper: Thread/unthread right sleeve        Upper body assist Assist Level: Supervision or verbal cues      Lower Body Dressing/Undressing Lower body dressing   What is the patient wearing?: Socks, Shoes, AFO, Pants     Pants- Performed by patient: Thread/unthread right pants leg, Thread/unthread left pants leg, Pull pants up/down Pants- Performed by helper: Pull pants up/down Non-skid slipper socks- Performed by patient: Don/doff right sock, Don/doff left sock Non-skid slipper socks- Performed by helper: Don/doff right sock, Don/doff left sock Socks - Performed by patient: Don/doff right sock, Don/doff left sock Socks - Performed by helper: Don/doff right sock, Don/doff left sock Shoes - Performed by  patient: Don/doff right shoe, Don/doff left shoe, Fasten right, Fasten left Shoes - Performed by helper: Don/doff right shoe AFO - Performed by patient: Don/doff right AFO AFO - Performed by helper: Don/doff right AFO      Lower body assist Assist for lower body dressing: Supervision or verbal cues       Toileting Toileting   Toileting steps completed by patient: Adjust clothing prior to toileting, Adjust clothing after toileting Toileting steps completed by helper: Adjust clothing prior to toileting, Adjust clothing after toileting Toileting Assistive Devices: Grab bar or rail  Toileting assist Assist level: Touching or steadying assistance (Pt.75%)   Transfers Chair/bed transfer Chair/bed transfer activity did not occur: N/A Chair/bed transfer method: Squat pivot Chair/bed transfer assist level: Supervision or verbal cues Chair/bed transfer assistive device: Armrests Mechanical lift: Stedy   Locomotion Ambulation Ambulation activity did not occur: Safety/medical concerns (R hemiplegia w/ 0/5 MMT globally)   Max distance: 42 ft Assist level: Touching or steadying assistance (Pt > 75%)   Wheelchair   Type: Manual Max wheelchair distance: 150' Assist Level: Supervision or verbal cues  Cognition Comprehension Comprehension assist level: Follows complex conversation/direction with no assist  Expression Expression assist level: Expresses complex ideas: With no assist  Social Interaction Social Interaction assist level: Interacts appropriately with others - No medications needed.  Problem Solving Problem solving assist level: Solves complex 90% of the time/cues < 10% of the time  Memory Memory assist level: Recognizes or recalls 90% of the time/requires cueing < 10% of the time    Medical Problem List and Plan: 1.  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   Cont CIR, continues to make progress and enjoys therapies. Plan d/c 9/29  WHO/PRAFO  2.  DVT Prophylaxis/Anticoagulation: Pharmaceutical: Lovenox d/ced due to bruising and penile bleeding 3. HA/Pain Management: Continue tylenol prn for HA.  4. Mood: LCSW to follow for evaluation and support. Team to provide ego support.  5. Neuropsych: This patient is capable of making  decisions on his own behalf. 6. Skin/Wound Care: routine pressure relief measures.  7. Fluids/Electrolytes/Nutrition: Monitor I/Os 8. HTN: Monitor BP bid--continue lasix and aldactone.   Controlled 9/27 Vitals:   11/24/16 1527 11/25/16 0609  BP: 117/85 115/64  Pulse: 87 73  Resp: 18 18  Temp: 98.1 F (36.7 C) 98.7 F (37.1 C)  SpO2: 98% 96%   9. Cirrhosis of liver: has been set up with GI at [facility]. New diagnosis post decompensation. Resumed low salt restrictions with lasix and aldactone.   Decreased lactulose again on 9/11, d/ced 9/25.   Will continue to monitor for encephalopathy.  Monitor weights daily. 2 gram tylenol max/day 9. Leucocytosis: Resolved  Monitor for signs of infection  WBCs 9.7 on 9/14  Afebrile  Cont to monitor 10.  Prediabetes: Hgb A1c- 6.1.   No CBGs needed 11. ABLA  Hb 11.2 on 9/21  Cont to monitor 12. Hyponatremia: Resolved  Cont to monitor  LOS (Days) 24 A FACE TO FACE EVALUATION WAS PERFORMED  Jordan Thompson E 11/25/2016 8:08 AM

## 2016-11-26 ENCOUNTER — Inpatient Hospital Stay (HOSPITAL_COMMUNITY): Payer: Medicaid Other | Admitting: Occupational Therapy

## 2016-11-26 ENCOUNTER — Inpatient Hospital Stay (HOSPITAL_COMMUNITY): Payer: Self-pay | Admitting: Physical Therapy

## 2016-11-26 LAB — BASIC METABOLIC PANEL
ANION GAP: 8 (ref 5–15)
BUN: 8 mg/dL (ref 6–20)
CALCIUM: 9.3 mg/dL (ref 8.9–10.3)
CO2: 25 mmol/L (ref 22–32)
Chloride: 103 mmol/L (ref 101–111)
Creatinine, Ser: 0.76 mg/dL (ref 0.61–1.24)
GFR calc Af Amer: >60 mL/min (ref >60)
GLUCOSE: 118 mg/dL — AB (ref 65–99)
Potassium: 3.8 mmol/L (ref 3.5–5.1)
SODIUM: 136 mmol/L (ref 135–145)

## 2016-11-26 LAB — CBC
HCT: 36.4 % — ABNORMAL LOW (ref 39.0–52.0)
HEMOGLOBIN: 11.3 g/dL — AB (ref 13.0–17.0)
MCH: 25.9 pg — ABNORMAL LOW (ref 26.0–34.0)
MCHC: 31 g/dL (ref 30.0–36.0)
MCV: 83.3 fL (ref 78.0–100.0)
Platelets: 249 10*3/uL (ref 150–400)
RBC: 4.37 MIL/uL (ref 4.22–5.81)
RDW: 16.9 % — AB (ref 11.5–15.5)
WBC: 8.6 10*3/uL (ref 4.0–10.5)

## 2016-11-26 LAB — AMMONIA: Ammonia: 22 umol/L (ref 9–35)

## 2016-11-26 MED ORDER — MUSCLE RUB 10-15 % EX CREA: 1.0000 "application " | TOPICAL_CREAM | CUTANEOUS | 0 refills | Status: DC | PRN

## 2016-11-26 MED ORDER — CLOPIDOGREL BISULFATE 75 MG PO TABS: 75.0000 mg | ORAL_TABLET | Freq: Every day | ORAL | 0 refills | Status: AC

## 2016-11-26 MED ORDER — CLOPIDOGREL BISULFATE 75 MG PO TABS: 75.0000 mg | ORAL_TABLET | Freq: Every day | ORAL | 0 refills | Status: DC

## 2016-11-26 MED ORDER — LACTULOSE 10 GM/15ML PO SOLN: 30.0000 g | Freq: Every day | ORAL | 0 refills | Status: AC | PRN

## 2016-11-26 NOTE — Progress Notes (Signed)
Occupational Therapy Discharge Summary  Patient Details  Name: Bijon Mineer MRN: [id number] Date of Birth: [birth year]  Today's Date: 11/26/2016 OT Individual Time: 1300-1346 OT Individual Time Calculation (min): 46 min   Session Note: Pt completed toilet transfer with close supervision stand pivot from wheelchair to elevated toilet.  He was able to utilize the grab bar on the left side of the toilet to assist with transfers as well as propping himself on the wall on the left to balance, in order to manage his clothing.  Once completed he was able to propel himself down to the therapy gym for work on neuromuscular re-education for the LUE.  Positioned pt in supine with focus on shoulder flexion AAROM as well as elbow extension.  Mod assist needed for pt to maintain elbow extension along with shoulder flexion at 90 degrees in the RUE.  Transitioned to sitting with focus on weightbearing over the RUE while completing small movements of lateral leans to the right and pushing back up to midline.  Pt able to activate triceps and transition back to sitting with min assist.  Returned to room at end of session in wheelchair.  Call button and phone in reach.    Patient has met 10 of 10 long term goals due to improved balance, postural control, functional use of  RIGHT upper and RIGHT lower extremity and improved coordination.  Patient to discharge at overall Supervision level.  Patient's care partner is independent to provide the necessary physical and cognitive assistance at discharge.    Reasons goals not met: NA  Recommendation:  Patient will benefit from ongoing skilled OT services in outpatient setting to continue to advance functional skills in the area of BADL, iADL and Reduce care partner burden.  Pt continues to demonstrate RUE and RLE hemiparesis as well as the need for close supervision for functional transfers and selfcare sit to stand.  Increased fall risk is still present with dynamic  balance as it relates to selfcare and he continues to need mod assist for integration of the LUE into functional tasks such as bathing.  Recommend initial supervision for safety during all transfers to and from he toilet until he reaches a safer consistent level, especially with transferring to his left side.  Family has been educated on safe assist and agrees with this as well.    Equipment: BSC  Reasons for discharge: treatment goals met and discharge from hospital  Patient/family agrees with progress made and goals achieved: Yes  OT Discharge Precautions/Restrictions  Precautions Precautions: Fall Precaution Comments: R hemi Restrictions Weight Bearing Restrictions: No  Pain Pain Assessment Pain Assessment: No/denies pain ADL  See Function Section of chart  Vision Baseline Vision/History: Wears glasses Wears Glasses: Reading only Patient Visual Report: No change from baseline Vision Assessment?: No apparent visual deficits Perception  Perception: Within Functional Limits Praxis Praxis: Intact Cognition Overall Cognitive Status: Within Functional Limits for tasks assessed Arousal/Alertness: Awake/alert Orientation Level: Oriented X4 Attention: Selective Selective Attention: Appears intact Alternating Attention: Appears intact Memory: Appears intact Awareness: Appears intact Problem Solving: Appears intact Safety/Judgment: Appears intact Sensation Sensation Light Touch: Impaired Detail Light Touch Impaired Details: Impaired RUE Stereognosis: Not tested Hot/Cold: Not tested Proprioception: Impaired Detail Proprioception Impaired Details: Impaired RUE Additional Comments: Pt demonstrates impaired light touch and proprioception from the wrist distally. Coordination Gross Motor Movements are Fluid and Coordinated: No Fine Motor Movements are Fluid and Coordinated: No Coordination and Movement Description: Pt with Brunnstrum stage III in the right hand and arm.  He  needs mod assist for functional use of the RUE with bathing tasks. Motor  Motor Motor: Hemiplegia Motor - Discharge Observations: Pt still with RUE and RLE hemiparesis at this time.   Mobility  Transfers Transfers: Sit to Stand;Stand to Sit Sit to Stand: 5: Supervision;With armrests;From toilet;With upper extremity assist Stand to Sit: 5: Supervision;To toilet;With armrests;With upper extremity assist  Trunk/Postural Assessment  Cervical Assessment Cervical Assessment: Within Functional Limits Thoracic Assessment Thoracic Assessment: Within Functional Limits Lumbar Assessment Lumbar Assessment: Exceptions to [facility] (posterior pelvic tilt) Postural Control Postural Control: Deficits on evaluation  Balance Balance Balance Assessed: Yes Static Sitting Balance Static Sitting - Balance Support: No upper extremity supported;Feet supported Static Sitting - Level of Assistance: 7: Independent Dynamic Sitting Balance Dynamic Sitting - Balance Support: No upper extremity supported;Feet supported;During functional activity Dynamic Sitting - Level of Assistance: 6: Modified independent (Device/Increase time) Static Standing Balance Static Standing - Balance Support: During functional activity Static Standing - Level of Assistance: 5: Stand by assistance Dynamic Standing Balance Dynamic Standing - Balance Support: Left upper extremity supported;During functional activity Dynamic Standing - Level of Assistance: 5: Stand by assistance Extremity/Trunk Assessment RUE Assessment RUE Assessment: Exceptions to [facility] RUE Strength RUE Overall Strength Comments: Pt currently Brunnstrum stage III movement in the arm and hand.  He is able to complete synergy pattern with shoulder flexion and elbow flexion.  Trace elbow extension at this time is noted.  Gross digit flexion of 10-20% with no acitve digit extension at this time.   RUE Tone RUE Tone: Mild (slight tone in digit flexors and elbow flexors at this  time. ) LUE Assessment LUE Assessment: Within Functional Limits   See Function Navigator for Current Functional Status.  Kasia Trego OTR/L 11/26/2016, 4:44 PM

## 2016-11-26 NOTE — Progress Notes (Signed)
Physical Therapy Discharge Summary  Patient Details  Name: Jordan Thompson MRN: [id number] Date of Birth: 1965-06-03  Today's Date: 11/26/2016 PT Individual Time: 0920-1000 AND 1610-1650 PT Individual Time Calculation (min): 40 min AND 40 min  Session 1:  Pt in w/c upon arrival and agreeable to therapy, no c/o pain. Worked on functional mobility as outlined in discharge below. Negotiated 4 steps, performed bed mobility, w/c mobility, multiple transfers including car, and educated pt on importance of only transferring when someone can provide supervision for safety. His transfers remain inconsistent, fluctuating between Mod I and supervision. Pt verbalized understanding and in agreement. Ended session in w/c, call bell within reach and all needs met.   Session 2:  Pt in w/c upon arrival and agreeable to therapy, no c/o pain. Worked on functional mobility as outlined in discharge below. Ambulated 66' w/ RW, RAFO, and R hand splint. Ambulation w/ min guard and occasional verbal cues for gait pattern. Additionally set up toilet transfer in home-like environment to practice going to/from toilet w/ supervision. Returned to room and ended session in recliner, call bell within reach and all needs met.   Patient has met 8 of 8 long term goals due to improved activity tolerance, improved balance, improved postural control, increased strength, decreased pain, ability to compensate for deficits, functional use of  right lower extremity, improved attention, improved awareness and improved coordination.  Patient to discharge at a wheelchair level Modified Independent.   Patient's care partner is independent to provide the necessary physical assistance at discharge.  Reasons goals not met: n/a  Recommendation:  Patient will benefit from ongoing skilled PT services in outpatient setting to continue to advance safe functional mobility, address ongoing impairments in R hemiplegia, quality and independence of  gait, functional mobility, dynamic sitting and standing balance, cardiovascular endurance, and minimize fall risk.  Equipment: hemiwalker, w/c  Reasons for discharge: treatment goals met and discharge from hospital  Patient/family agrees with progress made and goals achieved: Yes  PT Discharge Precautions/Restrictions Precautions Precautions: None Precaution Comments: R hemi Restrictions Weight Bearing Restrictions: No Pain Pain Assessment Pain Assessment: No/denies pain Vision/Perception  Perception Perception: Within Functional Limits Praxis Praxis: Intact  Cognition Overall Cognitive Status: Within Functional Limits for tasks assessed Arousal/Alertness: Awake/alert Orientation Level: Oriented X4 Attention: Selective Selective Attention: Appears intact Alternating Attention: Appears intact Memory: Appears intact Awareness: Appears intact Problem Solving: Appears intact Safety/Judgment: Appears intact Sensation Sensation Light Touch: Impaired by gross assessment (Impaired sensation global RLE, decreased sensation - no numbness) Proprioception: Appears Intact Coordination Gross Motor Movements are Fluid and Coordinated: Yes Fine Motor Movements are Fluid and Coordinated: Yes Motor  Motor Motor: Hemiplegia Motor - Skilled Clinical Observations: R hemi, UE>LE Motor - Discharge Observations: R hemi, UE>LE  Mobility Bed Mobility Bed Mobility: Rolling Right;Rolling Left;Supine to Sit;Sit to Supine Rolling Right: 7: Independent Rolling Left: 7: Independent Supine to Sit: 7: Independent Sit to Supine: 7: Independent Transfers Transfers: Yes Sit to Stand: 6: Modified independent (Device/Increase time) Stand to Sit: 6: Modified independent (Device/Increase time) Stand Pivot Transfers: 6: Modified independent (Device/Increase time)  Locomotion  Ambulation Ambulation: Yes Ambulation/Gait Assistance: 4: Min guard Ambulation/gait distance: 43' Assistive device:  Rolling walker;Other (Comment) (RAFO, R hand splint) Gait Gait: Yes Gait Pattern: Impaired Gait Pattern: Decreased stance time - right;Step-to pattern;Decreased weight shift to right;Shuffle;Decreased trunk rotation;Trunk flexed;Poor foot clearance - right Stairs / Additional Locomotion Stairs: Yes Stairs Assistance: 4: Min assist Stairs Assistance Details: Verbal cues for technique;Verbal cues for precautions/safety Stair Management  Technique: One rail Left;Step to pattern Number of Stairs: 4 Height of Stairs: 6 Wheelchair Mobility Wheelchair Mobility: Yes Wheelchair Assistance: 6: Modified independent (Device/Increase time) Wheelchair Propulsion: Left upper extremity;Left lower extremity Wheelchair Parts Management: Independent Distance: >150'  Trunk/Postural Assessment  Cervical Assessment Cervical Assessment: Within Functional Limits Thoracic Assessment Thoracic Assessment: Within Functional Limits Lumbar Assessment Lumbar Assessment: Exceptions to [facility] (posterior pelvic tilt) Postural Control Postural Control: Deficits on evaluation (decreased righting response w/ LOB to R)  Balance Balance Balance Assessed: Yes Static Sitting Balance Static Sitting - Balance Support: No upper extremity supported;Feet supported Static Sitting - Level of Assistance: 7: Independent Dynamic Sitting Balance Dynamic Sitting - Balance Support: No upper extremity supported;Feet supported;During functional activity Dynamic Sitting - Level of Assistance: 6: Modified independent (Device/Increase time) Static Standing Balance Static Standing - Balance Support: During functional activity Static Standing - Level of Assistance: 5: Stand by assistance Dynamic Standing Balance Dynamic Standing - Balance Support: Left upper extremity supported;During functional activity Dynamic Standing - Level of Assistance: 5: Stand by assistance Extremity Assessment  RLE Assessment RLE Assessment: Exceptions to [facility]  (0/5 ankle ms, 1/5 knee flexion, 4/5 knee extension, 4-/5 hip ms) LLE Assessment LLE Assessment: Within Functional Limits   See Function Navigator for Current Functional Status.  Anahis Furgeson K Arnette 11/26/2016, 12:51 PM

## 2016-11-26 NOTE — Progress Notes (Signed)
Social Work  Discharge Note  The overall goal for the admission was met for:   Discharge location: [place] TO PROVIDE 24 HR CARE  Length of Stay: Yes-25 DAYS  Discharge activity level: Yes-SUPERVISION-MIN ASSIST LEVEL  Home/community participation: Yes  Services provided included: MD, RD, PT, OT, SLP, RN, CM, TR, Pharmacy, Neuropsych and SW  Financial Services: Other: PENDING MEDICAID  Follow-up services arranged: Outpatient: [facility] WILL CONTACT AT HOME TO ARRANGE APPOINTMENTS, DME: [place] and Patient/Family has no preference for HH/DME agencies  Comments (or additional information):WIFE AND MOTHER [place] HAVE BEEN IN FOR FAMILY EDUCATION AND THIS HAS BEEN COMPLETED. AWARE WILL NEED 24 HR CARE. SSD AND MEDICAID APPLICATIONS ARE PENDING AND PT AND WIFE TO FOLLOW UP WITH BOTH.   Patient/Family verbalized understanding of follow-up arrangements: Yes  Individual responsible for coordination of the follow-up plan: SELF & WENDY-WIFE  Confirmed correct DME delivered: Elease Hashimoto 11/26/2016    Elease Hashimoto

## 2016-11-26 NOTE — Progress Notes (Signed)
[place] PHYSICAL MEDICINE & REHABILITATION     PROGRESS NOTE  Subjective/Complaints:   Patient has questions about neurology follow-up. He has an appointment next week with his neurologist in [place]. Patient states that he can get outpatient PT through [facility], but not through [facility].  Patient was encouraged to keep his appointment with his neurologist in [place]. We also discussed that Dr. Allena Katz is a physical medicine and rehabilitation doctor and not her neurologist and this would be a separate appointment.  Patient has questions about equipment such as hemiwalker and wheelchair  ROS: Denies nausea, vomiting, diarrhea, shortness of breath or chest pain   Objective: Vital Signs: Blood pressure 107/73, pulse (!) 59, temperature 98.2 F (36.8 C), temperature source Oral, resp. rate 18, height  (1.778 m), weight 96.3 kg (212 lb 3.2 oz), SpO2 98 %. No results found.  Recent Labs  11/26/16 0546  WBC 8.6  HGB 11.3*  HCT 36.4*  PLT 249    Recent Labs  11/26/16 0546  NA 136  K 3.8  CL 103  GLUCOSE 118*  BUN 8  CREATININE 0.76  CALCIUM 9.3   CBG (last 3)  No results for input(s): GLUCAP in the last 72 hours.  Wt Readings from Last 3 Encounters:  11/25/16 96.3 kg (212 lb 3.2 oz)  10/28/16 97.7 kg (215 lb 6.2 oz)    Physical Exam:  BP 107/73 (BP Location: Left Arm)   Pulse (!) 59   Temp 98.2 F (36.8 C) (Oral)   Resp 18   Ht  (1.778 m)   Wt 96.3 kg (212 lb 3.2 oz)   SpO2 98%   BMI 30.45 kg/m  Constitutional: He appears well-developed and well-nourished. No distress.  HENT: Normocephalic and atraumatic.  Eyes: EOM are normal. No discharge.  Cardiovascular: RRR. No JVD  Respiratory: CTA Bilaterally. Normal effort. GI: Bowel sounds are normal. He exhibits no distension.  Musculoskeletal: He exhibits no edema, no tenderness Neurological: He is alert and oriented.  Dysphonia Improving.  Right facial weakness.  Verbose.   RUE:  Shoulder abduction 3/5, elbow extension/flexion 3-/5, hand grip 1/5 (unchanged) RLE: 4/5 HF, KE, 3/5 ADF/PF (slowly improving) LUE/LLE: 5/5 proximal to distal Skin: Skin is warm and dry. He is not diaphoretic.  Psychiatric: He has a normal mood and affect. His behavior is normal. Thought content normal.   Assessment/Plan: 1. 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 Physiatrist is providing close team supervision and 24 hour management of active medical problems listed below. Physiatrist and rehab team continue to assess barriers to discharge/monitor patient progress toward functional and medical goals. Plan discharge on 11/27/2016   Function:  Bathing Bathing position   Position: Shower  Bathing parts Body parts bathed by patient: Right arm, Chest, Abdomen, Front perineal area, Buttocks, Right upper leg, Left upper leg, Right lower leg, Left lower leg Body parts bathed by helper: Back, Left arm  Bathing assist Assist Level: Supervision or verbal cues      Upper Body Dressing/Undressing Upper body dressing   What is the patient wearing?: Pull over shirt/dress     Pull over shirt/dress - Perfomed by patient: Thread/unthread left sleeve, Put head through opening, Pull shirt over trunk, Thread/unthread right sleeve Pull over shirt/dress - Perfomed by helper: Thread/unthread right sleeve        Upper body assist Assist Level: Supervision or  verbal cues      Lower Body Dressing/Undressing Lower body dressing   What is the patient wearing?: Socks, Shoes, AFO, Pants     Pants- Performed by patient: Thread/unthread right pants leg, Thread/unthread left pants leg, Pull pants up/down Pants- Performed by helper: Pull pants up/down Non-skid slipper socks- Performed by patient: Don/doff right sock, Don/doff left sock Non-skid slipper  socks- Performed by helper: Don/doff right sock, Don/doff left sock Socks - Performed by patient: Don/doff right sock, Don/doff left sock Socks - Performed by helper: Don/doff right sock, Don/doff left sock Shoes - Performed by patient: Don/doff right shoe, Don/doff left shoe, Fasten right, Fasten left Shoes - Performed by helper: Don/doff right shoe AFO - Performed by patient: Don/doff right AFO AFO - Performed by helper: Don/doff right AFO      Lower body assist Assist for lower body dressing: Supervision or verbal cues      Toileting Toileting   Toileting steps completed by patient: Adjust clothing prior to toileting, Performs perineal hygiene, Adjust clothing after toileting Toileting steps completed by helper: Adjust clothing prior to toileting, Adjust clothing after toileting Toileting Assistive Devices: Grab bar or rail  Toileting assist Assist level: Touching or steadying assistance (Pt.75%)   Transfers Chair/bed transfer Chair/bed transfer activity did not occur: N/A Chair/bed transfer method: Squat pivot Chair/bed transfer assist level: Supervision or verbal cues Chair/bed transfer assistive device: Armrests Mechanical lift: Stedy   Locomotion Ambulation Ambulation activity did not occur: Safety/medical concerns (R hemiplegia w/ 0/5 MMT globally)   Max distance: 19' Assist level: Touching or steadying assistance (Pt > 75%)   Wheelchair   Type: Manual Max wheelchair distance: 150' Assist Level: Supervision or verbal cues  Cognition Comprehension Comprehension assist level: Follows complex conversation/direction with no assist  Expression Expression assist level: Expresses complex ideas: With no assist  Social Interaction Social Interaction assist level: Interacts appropriately with others - No medications needed.  Problem Solving Problem solving assist level: Solves complex 90% of the time/cues < 10% of the time  Memory Memory assist level: Recognizes or recalls 90%  of the time/requires cueing < 10% of the time    Medical Problem List and Plan: 1.  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   Cont CIR, continues to make progress and enjoys therapies. Plan d/c 9/29  WHO/PRAFO  2.  DVT Prophylaxis/Anticoagulation: Pharmaceutical: Lovenox d/ced due to bruising and penile bleeding 3. HA/Pain Management: Continue tylenol prn for HA.  4. Mood: LCSW to follow for evaluation and support. Team to provide ego support.  5. Neuropsych: This patient is capable of making decisions on his own behalf. 6. Skin/Wound Care: routine pressure relief measures.  7. Fluids/Electrolytes/Nutrition: Monitor I/Os 8. HTN: Monitor BP bid--continue lasix and aldactone.   Controlled 9/27 Vitals:   11/25/16 1557 11/26/16 0528  BP: 111/79 107/73  Pulse: 88 (!) 59  Resp: 18   Temp: 98.2 F (36.8 C) 98.2 F (36.8 C)  SpO2: 98% 98%   9. Cirrhosis of liver: has been set up with GI at [facility]. New diagnosis post decompensation. Resumed low salt restrictions with lasix and aldactone.   Decreased lactulose again on 9/11, d/ced 9/25.   Will continue to monitor for encephalopathy.  Monitor weights daily. 2 gram tylenol max/day 9. Leucocytosis: Resolved  Monitor for signs of infection  WBCs 9.7 on 9/14  Afebrile  Cont to monitor 10.  Prediabetes: Hgb A1c- 6.1.   No CBGs needed 11. ABLA  Hb 11.2  on 9/21  Cont to monitor 12. Hyponatremia: Resolved  Cont to monitor  LOS (Days) 25 A FACE TO FACE EVALUATION WAS PERFORMED  Erick Colace 11/26/2016 8:19 AM

## 2016-11-26 NOTE — Discharge Instructions (Signed)
Inpatient Rehab Discharge Instructions  Jordan Thompson Discharge date and time:  11/26/16  Activities/Precautions/ Functional Status: Activity: no lifting, driving, or strenuous exercise for till cleared by MD Diet: cardiac diet and diabetic diet Wound Care: none needed    Functional status:  ___ No restrictions     ___ Walk up steps independently _X__ 24/7 supervision/assistance   ___ Walk up steps with assistance ___ Intermittent supervision/assistance  ___ Bathe/dress independently ___ Walk with walker     _X__ Bathe/dress with assistance ___ Walk Independently    ___ Shower independently ___ Walk with assistance    ___ Shower with assistance __X_ No alcohol     ___ Return to work/school ________   Special Instructions: 1. Contact GI doctor if you notice any issue with increase in bleeding or mental status changes  (confusion).    COMMUNITY REFERRALS UPON DISCHARGE:   OUTPATIENT REHAB: [facility] PT & OT WILL CONTACT AT HOME TO ARRANGE APPOINTMENTS NUMBER: [phone]  Medical Equipment/Items Ordered: WHEELCHAIR, 3 IN 1, [facility]  Agency/Supplier:[facility]   [phone] OTHER: [name] WRIGHT FINANCIAL COUNSELOR-[phone]   GENERAL COMMUNITY RESOURCES FOR PATIENT/FAMILY: Support Groups:[facility] SUPPORT GROUP SECOND Tuesday OF THE MONTH @ 6:00 PM @ [address], [place] [address] QUESTIONS [phone]   STROKE/TIA DISCHARGE INSTRUCTIONS SMOKING Cigarette smoking nearly doubles your risk of having a stroke & is the single most alterable risk factor  If you smoke or have smoked in the last 12 months, you are advised to quit smoking for your health.  Most of the excess cardiovascular risk related to smoking disappears within a year of stopping.  Ask you doctor about anti-smoking medications  [place] Quit Line: 1-800-QUIT NOW  Free Smoking Cessation Classes [phone]  CHOLESTEROL Know your levels; limit fat & cholesterol in  your diet  Lipid Panel     Component Value Date/Time   CHOL 88 10/29/2016 0220   TRIG 104 10/29/2016 0220   HDL 17 (L) 10/29/2016 0220   CHOLHDL 5.2 10/29/2016 0220   VLDL 21 10/29/2016 0220   LDLCALC 50 10/29/2016 0220      Many patients benefit from treatment even if their cholesterol is at goal.  Goal: Total Cholesterol (CHOL) less than 160  Goal:  Triglycerides (TRIG) less than 150  Goal:  HDL greater than 40  Goal:  LDL (LDLCALC) less than 100   BLOOD PRESSURE American Stroke Association blood pressure target is less that 120/80 mm/Hg  Your discharge blood pressure is:  BP: 107/73  Monitor your blood pressure  Limit your salt and alcohol intake  Many individuals will require more than one medication for high blood pressure  DIABETES (A1c is a blood sugar average for last 3 months) Goal HGBA1c is under 7% (HBGA1c is blood sugar average for last 3 months)  Diabetes: Pre-diabetes    Lab Results  Component Value Date   HGBA1C 6.1 (H) 10/29/2016     Your HGBA1c can be lowered with medications, healthy diet, and exercise.  Check your blood sugar as directed by your physician  Call your physician if you experience unexplained or low blood sugars.  PHYSICAL ACTIVITY/REHABILITATION Goal is 30 minutes at least 4 days per week  Activity: No driving, Therapies: see above Return to work: N/A  Activity decreases your risk of heart attack and stroke and makes your heart stronger.  It helps control your weight and blood pressure; helps you relax and can improve your mood.  Participate in a regular  exercise program.  Talk with your doctor about the best form of exercise for you (dancing, walking, swimming, cycling).  DIET/WEIGHT Goal is to maintain a healthy weight  Your discharge diet is: Diet Heart Room service appropriate? Yes; Fluid consistency: Thin  liquids Your height is:  Height:  (177.8 cm) Your current weight is: Weight: 96.3 kg (212 lb 3.2 oz) Your Body  Mass Index (BMI) is:  BMI (Calculated): 30.45  Following the type of diet specifically designed for you will help prevent another stroke.  Your goal weight is:  174 lbs  Your goal Body Mass Index (BMI) is 19-24.  Healthy food habits can help reduce 3 risk factors for stroke:  High cholesterol, hypertension, and excess weight.  RESOURCES Stroke/Support Group:  Call [phone]   STROKE EDUCATION PROVIDED/REVIEWED AND GIVEN TO PATIENT Stroke warning signs and symptoms How to activate emergency medical system (call 911). Medications prescribed at discharge. Need for follow-up after discharge. Personal risk factors for stroke. Pneumonia vaccine given:  Flu vaccine given:  My questions have been answered, the writing is legible, and I understand these instructions.  I will adhere to these goals & educational materials that have been provided to me after my discharge from the hospital.     My questions have been answered and I understand these instructions. I will adhere to these goals and the provided educational materials after my discharge from the hospital.  Patient/Caregiver Signature _______________________________ Date __________  Clinician Signature _______________________________________ Date __________  Please bring this form and your medication list with you to all your follow-up doctor's appointments.

## 2016-11-26 NOTE — Progress Notes (Signed)
Occupational Therapy Session Note  Patient Details  Name: Jordan Thompson MRN: [id number] Date of Birth: Sep 29, 1965  Today's Date: 11/26/2016 OT Individual Time: 0800-0903 OT Individual Time Calculation (min): 63 min    Short Term Goals: Week 4:  OT Short Term Goal 1 (Week 4): Continue working on [employer].  Skilled Therapeutic Interventions/Progress Updates:    Pt transferred to the walk-in shower with supervision stand pivot from the wheelchair to the tub bench.  He was able to complete all bathing sit to stand in the shower.  Mod assist for integration of the RUE for washing the left arm.  He transferred out to the wheelchair for dressing with supervision overall sit to stand.  Grooming tasks with modified independence completed to finish session, from wheelchair level.    Therapy Documentation Precautions:  Precautions Precautions: Fall Precaution Comments: R hemi Restrictions Weight Bearing Restrictions: No Pain: Pain Assessment Pain Assessment: No/denies pain ADL: See Function Navigator for Current Functional Status.   Therapy/Group: Individual Therapy  Jordan Thompson OTR/L 11/26/2016, 11:53 AM

## 2016-11-26 NOTE — Progress Notes (Signed)
Social Work Patient ID: Jordan Thompson, male   DOB: 12/21/1965, 51 y.o.   MRN: [id number]  Met with pt to inform [facility] has an OP rehab and they would take him and provide follow up therapies due to already has been approved for charity care there. Have sent referral in and they will contact him to arrange appointments due to has many other medical appointments. He is set to go home tomorrow and pleased with all of the progress he has made here.

## 2016-11-27 DIAGNOSIS — I63112 Cerebral infarction due to embolism of left vertebral artery: Secondary | ICD-10-CM

## 2016-11-27 NOTE — Progress Notes (Signed)
[facility]IC[facility]ILITATION     PROGRESS NOTE  Subjective/Complaints:  Ready to go home. Hasn't received w/c yet. Spoke to sw who contacted agency. Discussed follow up issues with patient today  ROS: pt denies nausea, vomiting, diarrhea, cough, shortness of breath or chest pain   Objective: Vital Signs: Blood pressure 115/65, pulse 76, temperature 99 F (37.2 C), temperature source Oral, resp. rate 16, height  (1.778 m), weight 81.6 kg (180 lb), SpO2 95 %. No results found.  Recent Labs  11/26/16 0546  WBC 8.6  HGB 11.3*  HCT 36.4*  PLT 249    Recent Labs  11/26/16 0546  NA 136  K 3.8  CL 103  GLUCOSE 118*  BUN 8  CREATININE 0.76  CALCIUM 9.3   CBG (last 3)  No results for input(s): GLUCAP in the last 72 hours.  Wt Readings from Last 3 Encounters:  11/27/16 81.6 kg (180 lb)  10/28/16 97.7 kg (215 lb 6.2 oz)    Physical Exam:  BP 115/65 (BP Location: Left Arm)   Pulse 76   Temp 99 F (37.2 C) (Oral)   Resp 16   Ht  (1.778 m)   Wt 81.6 kg (180 lb)   SpO2 95%   BMI 25.83 kg/m  Constitutional: He appears well-developed and well-nourished. No distress.  HENT: Normocephalic and atraumatic.  Eyes: EOM are normal. No discharge.  Cardiovascular: RRR without murmur. No JVD  Respiratory: CTA Bilaterally. Normal effort. GI: Bowel sounds are normal. He exhibits no distension.  Musculoskeletal: He exhibits no edema, no tenderness Neurological: He is alert and oriented.  Dysphonia Improving.  Right facial weakness.  Verbose.   RUE: Shoulder abduction 3/5, elbow extension/flexion 3-/5, hand grip 1/5 (unchanged) RLE: 4/5 HF, KE, 3/5 ADF/PF (slowly improving) LUE/LLE: 5/5 proximal to distal Skin: Skin is warm and dry. He is not diaphoretic.  Psychiatric: He has a normal mood and affect. His behavior is normal. Thought content normal.   Assessment/Plan: 1. Functional deficits secondary to left medullary infarct, bilateral cerebellar  infarcts which require 3+ hours per day of interdisciplinary therapy in a comprehensive inpatient rehab setting. Physiatrist is providing close team supervision and 24 hour management of active medical problems listed below. Physiatrist and rehab team continue to assess barriers to discharge/monitor patient progress toward functional and medical goals. Plan discharge on 11/27/2016   Function:  Bathing Bathing position   Position: Shower  Bathing parts Body parts bathed by patient: Right arm, Chest, Abdomen, Front perineal area, Buttocks, Right upper leg, Left upper leg, Right lower leg, Left lower leg Body parts bathed by helper: Left arm, Back  Bathing assist Assist Level: Supervision or verbal cues      Upper Body Dressing/Undressing Upper body dressing   What is the patient wearing?: Pull over shirt/dress     Pull over shirt/dress - Perfomed by patient: Thread/unthread left sleeve, Put head through opening, Pull shirt over trunk, Thread/unthread right sleeve Pull over shirt/dress - Perfomed by helper: Thread/unthread right sleeve        Upper body assist Assist Level: Supervision or verbal cues      Lower Body Dressing/Undressing Lower body dressing   What is the patient wearing?: Socks, Shoes, AFO, Pants     Pants- Performed by patient: Thread/unthread right pants leg, Thread/unthread left pants leg, Pull pants up/down Pants- Performed by helper: Pull pants up/down Non-skid slipper socks- Performed by patient: Don/doff right sock, Don/doff left sock Non-skid slipper socks- Performed by  helper: Don/doff right sock, Don/doff left sock Socks - Performed by patient: Don/doff right sock, Don/doff left sock Socks - Performed by helper: Don/doff right sock, Don/doff left sock Shoes - Performed by patient: Fasten left, Fasten right, Don/doff left shoe, Don/doff right shoe Shoes - Performed by helper: Don/doff right shoe AFO - Performed by patient: Don/doff right AFO AFO -  Performed by helper: Don/doff right AFO      Lower body assist Assist for lower body dressing: Supervision or verbal cues      Toileting Toileting   Toileting steps completed by patient: Adjust clothing prior to toileting, Performs perineal hygiene, Adjust clothing after toileting Toileting steps completed by helper: Adjust clothing prior to toileting, Adjust clothing after toileting Toileting Assistive Devices: Grab bar or rail  Toileting assist Assist level: Supervision or verbal cues   Transfers Chair/bed transfer Chair/bed transfer activity did not occur: N/A Chair/bed transfer method: Stand pivot Chair/bed transfer assist level: No Help, no cues, assistive device, takes more than a reasonable amount of time Chair/bed transfer assistive device: Armrests Mechanical lift: Landscape architect Ambulation activity did not occur: Safety/medical concerns (R hemiplegia w/ 0/5 MMT globally)   Max distance: 81' Assist level: Touching or steadying assistance (Pt > 75%)   Wheelchair   Type: Manual Max wheelchair distance: 150' Assist Level: No help, No cues, assistive device, takes more than reasonable amount of time  Cognition Comprehension Comprehension assist level: Follows complex conversation/direction with no assist  Expression Expression assist level: Expresses complex ideas: With no assist  Social Interaction Social Interaction assist level: Interacts appropriately with others - No medications needed.  Problem Solving Problem solving assist level: Solves complex 90% of the time/cues < 10% of the time  Memory Memory assist level: Recognizes or recalls 90% of the time/requires cueing < 10% of the time    Medical Problem List and Plan: 1.  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   Cont CIR, continues to make progress and enjoys therapies.   -home follow up  -Patient to see Rehab MD in the office for  transitional care encounter in 1-2 weeks.  Follow up with neuro in [place]  WHO/PRAFO  2.  DVT Prophylaxis/Anticoagulation: Pharmaceutical: Lovenox d/ced due to bruising and penile bleeding 3. HA/Pain Management: Continue tylenol prn for HA.  4. Mood: LCSW to follow for evaluation and support. Team to provide ego support.  5. Neuropsych: This patient is capable of making decisions on his own behalf. 6. Skin/Wound Care: routine pressure relief measures.  7. Fluids/Electrolytes/Nutrition: Monitor I/Os 8. HTN: Monitor BP bid--continue lasix and aldactone.   Controlled 9/27 Vitals:   11/26/16 1346 11/27/16 0522  BP: 110/77 115/65  Pulse: 74 76  Resp: 20 16  Temp: 98.5 F (36.9 C) 99 F (37.2 C)  SpO2: 99% 95%   9. Cirrhosis of liver: has been set up with GI at [facility]. New diagnosis post decompensation. Resumed low salt restrictions with lasix and aldactone.   Decreased lactulose again on 9/11, d/ced 9/25.   Will continue to monitor for encephalopathy.  Monitor weights daily. 2 gram tylenol max/day 9. Leucocytosis: Resolved  Monitor for signs of infection  WBCs 9.7 on 9/14  Afebrile  Cont to monitor 10.  Prediabetes: Hgb A1c- 6.1.   No CBGs needed 11. ABLA  Hb 11.2 on 9/21  Cont to monitor 12. Hyponatremia: Resolved  Cont to monitor  LOS (Days) 26 A FACE TO FACE EVALUATION WAS PERFORMED  Kaeya Schiffer T 11/27/2016 8:48 AM

## 2016-11-30 ENCOUNTER — Telehealth: Payer: Self-pay | Admitting: *Deleted

## 2016-11-30 NOTE — Telephone Encounter (Addendum)
Transitional Care call-1st attempt to reach: 11/30/16 3:29 pm left VM requesting call back to office and the appt date and time was given. Mr Depree returned call  1. Are you/is patient experiencing any problems since coming home? Are there any questions regarding any aspect of care?NO questions, though he still says he has tingling in right arm and leg. This was discussed in hospital with Dr Allena Katz. 2. Are there any questions regarding medications administration/dosing? Are meds being taken as prescribed? Patient should review meds with caller to confirm Yes picked up plavix yesterday 3. Have there been any falls?No 4. Has Home Health been to the house and/or have they contacted you? If not, have you tried to contact them? Can we help you contact them? Going to outpt therapy 5. Are bowels and bladder emptying properly? Are there any unexpected incontinence issues? If applicable, is patient following bowel/bladder programs? No 6. Any fevers, problems with breathing, unexpected pain? No 7. Are there any skin problems or new areas of breakdown? No 8. Has the patient/family member arranged specialty MD follow up (ie cardiology/neurology/renal/surgical/etc)?  Can we help arrange? Appts arranged.  He relies on mother in law for travel and all the appts are stressful.  I have given appt with Dr Allena Katz.  He will call 24 hours in advance if he can not make it to appt. 9. Does the patient need any other services or support that we can help arrange? No 10. Are caregivers following through as expected in assisting the patient? Yes 11. Has the patient quit smoking, drinking alcohol, or using drugs as recommended? Yes     Appointment Thursday 12/09/16 :00 arrive by 12:30 to see Dr Allena Katz [address]

## 2016-12-01 ENCOUNTER — Other Ambulatory Visit: Payer: Self-pay | Admitting: *Deleted

## 2016-12-01 NOTE — Patient Outreach (Signed)
[facility]) Care Management  12/01/2016  Jordan Thompson 03/18/1965 [id number]  EMMI- Stroke RED ON EMMI ALERT DAY#: 1 DATE: 11/30/16 RED ALERT: Scheduled a follow-up appointment? No  Outreach attempt #1 to patient. No answer. RN CM left HIPAA compliant message along with contact info.    Plan: RN CM will contact patient within one week.  Wynelle Cleveland, RN, BSN, MHA/MSL, [facility] Telephonic Care Manager Coordinator [facility] Direct Phone: [phone] Toll Free: [phone] Fax: [phone]

## 2016-12-02 ENCOUNTER — Other Ambulatory Visit: Payer: Self-pay | Admitting: *Deleted

## 2016-12-02 NOTE — Patient Outreach (Signed)
[facility]) Care Management  12/02/2016  Jordan Thompson Apr 23, 1965 [id number]  EMMI- Stroke RED ON EMMI ALERT DAY#: 1 DATE: 11/30/16 RED ALERT: Scheduled a follow-up appointment? No  Incoming call from patient. HIPAA identifiers verified with patient. Patient reported, his health is improving. Patient was hospitalized on 10/27/16 and 11/21/16, diagnosed with having a CVA each admission.  He is currently in a wheelchair due to having a second CVA.  Patient stated, "His entire right side is numb". He was told by the Neurologist that his symptoms will subside, per patient. Patient stated, he is determined to ambulate independently again. He is participating in outpatient PT/OT for 20 visits, approved via [facility]. He reported, he is maintaining an exercise routine at home to increase his strength. His family assist patient with transportation to his medical appointments. Patient discussed prior to his strokes, he was "a heavy drinker" and he was diagnosed with Liver failure. 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 He received his hospital discharge paperwork and he understood it. He was able to obtain all of his medications, including Plavix and Aspirin. He was approved for financial assistance through [facility]. He has an upcoming appointment with his primary MD on 12/06/16. On 12/01/16, he had an appointment with the Neurologist at [facility]. Patient stated, the Neurologist at [facility] told him to follow-up with the Neurologist in [place]. Patient asked RN CM to contact Dr. Warren Danes office to assist him with arranging a follow-up appointment. RN CM agreed to assist patient with arranging his appointment. Patient has an appointment with Dr. Allena Katz on 12/09/16.  Updated 12/02/16: RN CM spoke with Diane at Dr. Warren Danes office and requested for her to contact patient. Diane agreed to contact patient regarding a follow-up appointment.  Plan: RN CM will contact Dr.  Warren Danes office to arrange an appointment for patient, per patient's request. RN CM will notify [facility] Case Management Assistant regarding case closure.  RN CM will send patient EMMI educational materials.   Wynelle Cleveland, RN, BSN, MHA/MSL, [facility] Telephonic Care Manager Coordinator [facility] Direct Phone: [phone] Toll Free: [phone] Fax: [phone]

## 2016-12-03 ENCOUNTER — Encounter: Payer: Self-pay | Admitting: *Deleted

## 2016-12-09 ENCOUNTER — Encounter: Payer: Medicaid Other | Attending: Physical Medicine & Rehabilitation | Admitting: Physical Medicine & Rehabilitation

## 2016-12-09 ENCOUNTER — Encounter: Payer: Self-pay | Admitting: Physical Medicine & Rehabilitation

## 2016-12-09 VITALS — BP 113/76 | HR 82

## 2016-12-09 DIAGNOSIS — E7800 Pure hypercholesterolemia, unspecified: Secondary | ICD-10-CM | POA: Diagnosis not present

## 2016-12-09 DIAGNOSIS — Z8673 Personal history of transient ischemic attack (TIA), and cerebral infarction without residual deficits: Secondary | ICD-10-CM | POA: Diagnosis not present

## 2016-12-09 DIAGNOSIS — K7030 Alcoholic cirrhosis of liver without ascites: Secondary | ICD-10-CM | POA: Diagnosis not present

## 2016-12-09 DIAGNOSIS — I10 Essential (primary) hypertension: Secondary | ICD-10-CM | POA: Diagnosis not present

## 2016-12-09 DIAGNOSIS — R269 Unspecified abnormalities of gait and mobility: Secondary | ICD-10-CM

## 2016-12-09 DIAGNOSIS — G8191 Hemiplegia, unspecified affecting right dominant side: Secondary | ICD-10-CM | POA: Insufficient documentation

## 2016-12-09 NOTE — Progress Notes (Signed)
Subjective:    Patient ID: Jordan Thompson, male    DOB: April 03, 1965, 51 y.o.   MRN: [id number]  HPI 51 y.o. 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  Admit date: 11/01/2016 Discharge date: 11/27/16  At discharge, he was instructed to take lactulose as needed.  He sees PCP next week.  He sees GI in a couple weeks.  He saw Neurology at [facility] and is scheduled for Neurology in [place] next month.  He is wearing his braces at night. Denies pain.  BP is controlled. Denies falls.   Therapies: 2/weeks DME: Bedside commode Mobility: Walker at home, wheelchair in community  Pain Inventory Average Pain 0 Pain Right Now 0 My pain is na  In the last 24 hours, has pain interfered with the following? General activity 0 Relation with others 0 Enjoyment of life 0 What TIME of day is your pain at its worst? na Sleep (in general) Good  Pain is worse with: na Pain improves with: na Relief from Meds: na  Mobility walk with assistance use a walker ability to climb steps?  no do you drive?  no  Function not employed: date last employed . I need assistance with the following:  bathing, toileting, meal prep, household duties and shopping  Neuro/Psych weakness numbness tingling trouble walking dizziness  Prior Studies Any changes since last visit?  no  Physicians involved in your care Any changes since last visit?  no   Family History  Problem Relation Age of Onset  . Hypertension Mother   . Cancer Mother   . Hypertension Father   . Parkinson's disease Father    Social History   Social History  . Marital status: Married    Spouse name: N/A  . Number of children: N/A  . Years of education: N/A   Social History Main Topics  . Smoking status: Never Smoker  . Smokeless  tobacco: Never Used  . Alcohol use Yes     Comment: quit june 2018  . Drug use: No  . Sexual activity: Yes   Other Topics Concern  . None   Social History Narrative  . None   Past Surgical History:  Procedure Laterality Date  . PARACENTESIS  07/2016   tapped for 12 liters during hospitalization   Past Medical History:  Diagnosis Date  . Cirrhosis of liver (HCC)   . CVA (cerebral vascular accident) (HCC)   . Hypercholesterolemia   . Hypertension    BP 113/76   Pulse 82   SpO2 96%   Opioid Risk Score:   Fall Risk Score:  `1  Depression screen PHQ 2/9  No flowsheet data found.  Review of Systems  HENT: Negative.   Eyes: Negative.   Respiratory: Negative.   Cardiovascular: Negative.   Gastrointestinal: Negative.   Endocrine: Negative.   Genitourinary: Negative.   Musculoskeletal: Positive for gait problem.  Skin: Negative.   Allergic/Immunologic: Negative.   Neurological: Positive for dizziness, weakness and numbness. Negative for headaches.  Hematological: Negative.   Psychiatric/Behavioral: Negative.   All other systems reviewed and are negative.     Objective:   Physical Exam Constitutional: He appears well-developed and well-nourished. No distress.  HENT: Normocephalic and atraumatic.  Eyes: EOM are normal. No discharge.  Cardiovascular: RRR without murmur. No JVD  Respiratory: CTA Bilaterally. Normal effort. GI: Bowel sounds are normal. He exhibits  no distension.  Musculoskeletal: He exhibits no edema, no tenderness Neurological: He is alert and oriented.  Dysphonia improving.  Verbose.   RUE: Shoulder abduction 4/5, elbow extension/flexion 3+/5, hand grip 1+/5  RLE: 4+/5 HF, KE, 3/5 ADF/PF LUE/LLE: 5/5 proximal to distal Skin: Skin is warm and dry. He is not diaphoretic.  Psychiatric: He has a normal mood and affect. His behavior is normal. Thought content normal.      Assessment & Plan:  51 y.o. male with pmh of hypertension,  hypercholesterolemia, recent decompensation due to alcoholic cirrhosis, L-PCA CVA 04/09/39 presents for transitional care management after receiving CIR for left medullary infarct, bilateral cerebellar infarcts.  1. 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   Cont therapies  Follow up with Neurology  Cont meds  2.  HTN:   Cont meds  Controlled at present  3. Cirrhosis of liver:   Cont to follow up with GI at [facility]  Cont lactulose prn  4. Gait abnormality  Cont walker/wheelchair for safety  Cont therapies  Meds reviewed Referrals reviewed All questions answered

## 2016-12-16 ENCOUNTER — Telehealth: Payer: Self-pay | Admitting: Neurology

## 2016-12-16 NOTE — Telephone Encounter (Signed)
Rn referred to discharge on  11/27/2016.

## 2016-12-16 NOTE — Telephone Encounter (Signed)
Called the pt back, per Angie pt can bring $50. Per Katrina/RN [facility] should continue taking plavix and aspirin.

## 2016-12-16 NOTE — Telephone Encounter (Signed)
Pt called he saw Dr Eino FarberSeo/[place] (neurologist) yesterday. He said she wanted to know if he should continue taking plavix and aspirin together. Please call to discuss. Pt is waiting for medicaid to be approved around the end of Nov early Dec. Pt said he did not have income and does not have $200 to pay for OV.  An appt has been scheduled for 11/14, he will call back to r/s if needed-. Pt said he is very anxious.

## 2017-01-12 ENCOUNTER — Encounter: Payer: Self-pay | Admitting: Physical Medicine & Rehabilitation

## 2017-01-12 ENCOUNTER — Encounter: Payer: Self-pay | Admitting: Neurology

## 2017-01-12 ENCOUNTER — Ambulatory Visit: Payer: Medicaid Other | Admitting: Neurology

## 2017-01-12 VITALS — BP 138/88 | HR 85 | Ht 70.0 in | Wt 222.0 lb

## 2017-01-12 DIAGNOSIS — F1010 Alcohol abuse, uncomplicated: Secondary | ICD-10-CM

## 2017-01-12 DIAGNOSIS — I63212 Cerebral infarction due to unspecified occlusion or stenosis of left vertebral arteries: Secondary | ICD-10-CM

## 2017-01-12 DIAGNOSIS — E785 Hyperlipidemia, unspecified: Secondary | ICD-10-CM

## 2017-01-12 DIAGNOSIS — G8111 Spastic hemiplegia affecting right dominant side: Secondary | ICD-10-CM

## 2017-01-12 MED ORDER — BACLOFEN 5 MG PO TABS: 5.0000 mg | ORAL_TABLET | Freq: Three times a day (TID) | ORAL | 3 refills | Status: AC

## 2017-01-12 NOTE — Patient Instructions (Signed)
-   continue ASA and plavix for another 2 weeks and then plavix only.  - continue lipitor for stroke prevention.  - [facility] - continue aggressive PT/OT and work hard  - will prescribe baclofen for muscle spasm. Start with low dose 5mg  three time a day.  - Follow up with your primary care physician for stroke risk factor modification. Recommend maintain blood pressure goal <130/80, diabetes with hemoglobin A1c goal below 7.0% and lipids with LDL cholesterol goal below 70 mg/dL.  - check BP at home and record.  - [facility] for liver function [facility] - I think you do not need two neurology service, due to your other services and charity program with [facility], I recommend to continue [facility] Dr. Crissie SicklesSeo or Dr. Raenette Roveroque. - recommend to follow up with Dr. Crissie SicklesSeo in 3 months for re-evaluation.  - follow up here as needed.

## 2017-01-12 NOTE — Progress Notes (Signed)
STROKE NEUROLOGY FOLLOW UP NOTE  NAME: Jordan Thompson DOB: [birth year]  REASON FOR VISIT: stroke follow up HISTORY FROM: pt and chart  Today we had the pleasure of seeing Torian Quintero in follow-up at our Neurology Clinic. Pt was accompanied by friend.   History Summary Mr. Jaylend Reiland is a 51 y.o. 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 He was admitted on 10/06/16 in [facility] for sudden onset dizziness, difficulty swallowing, hoarseness of voice, nystagmus, left proptosis, truncal ataxia and difficulty balancing, consistent with Wallenberg syndrome.  CT head showed left PICA infarct.  Not able to do MRI due to claustrophobia.  He was treated there, discharged on aspirin 81 and Lipitor 40.  Admitted to [facility] on 10/28/16 for right arm and right leg weakness and numbness with dysarthria.  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  Stroke etiology considered as left tibia occlusion with clot formation.  EF 60-65%.  LDL 50, A1c 6.1.  He was initially put on heparin IV, later switched to dual antiplatelet.  Continued Lipitor 40.  Discharge to inpatient rehab.  Interval History During the interval time, the patient has been doing better. Discharged from CIR, and now at home.  Right-sided weakness improved, able to walk with walker although came in today with a wheelchair.  Still has PT/OT twice a week.  He stated that tonight, sometime he had right lower extremity automatic flexion and tightness of the right upper extremity.  He was told about spasticity on the right arm and leg by PT.  He follow-up with [facility] neurology Dr. Crissie Sickles and Dr. Raenette Rover, no medication changes, however recommend monotherapy of antiplatelet.  REVIEW OF SYSTEMS: Full 14 system review of systems  performed and notable only for those listed below and in HPI above, all others are negative:  Constitutional:   Cardiovascular:  Ear/Nose/Throat:   Skin:  Eyes:   Respiratory:   Gastroitestinal:   Genitourinary:  Hematology/Lymphatic:   Endocrine:  Musculoskeletal:   Allergy/Immunology:   Neurological: Numbness, weakness, tremor Psychiatric:  Sleep:   The following represents the patient's updated allergies and side effects list: Allergies  Allergen Reactions  . Aleve [Naproxen Sodium] Other (See Comments)    Pt will not take  . Penicillins Nausea And Vomiting and Other (See Comments)    Pt states had a rxn as a child Has patient had a PCN reaction causing immediate rash, facial/tongue/throat swelling, SOB or lightheadedness with hypotension: No Has patient had a PCN reaction causing severe rash involving mucus membranes or skin necrosis: No Has patient had a PCN reaction that required hospitalization: No Has patient had a PCN reaction occurring within the last 10 years: No If all of the above answers are "NO", then may proceed with Cephalosporin use.  . Pepto-Bismol  [Bismuth Subsalicylate] Nausea Only    The neurologically relevant items on the patient's problem list were reviewed on today's visit.  Neurologic Examination  A problem focused neurological exam (12 or more points of the single system neurologic examination, vital signs counts as 1 point, cranial nerves count for 8 points) was performed.  Blood pressure 138/88, pulse 85, height 5\' 10"  (1.778 m), weight 222 lb (100.7 kg).  General - Well nourished, well developed, in no apparent distress.  Ophthalmologic - Sharp disc margins OU.  Cardiovascular - Regular rate and rhythm with no murmur.  Mental Status -  Level of arousal and orientation to time, place, and person were intact. Language including expression, naming, repetition, comprehension was assessed and found intact. Attention span and concentration  were normal. Fund of Knowledge was assessed and was intact.  Cranial Nerves II - XII - II - Visual field intact OU. III, IV, VI - Extraocular movements intact. V - Facial sensation intact bilaterally. VII - Facial movement intact bilaterally. VIII - Hearing & vestibular intact bilaterally. X - Palate elevates symmetrically. XI - Chin turning & shoulder shrug intact bilaterally. XII - Tongue protrusion intact.  Motor Strength - The patient's strength was normal in LUE and LLE, however, 3/5 RUE and RLE.  Bulk was normal and fasciculations were absent.   Motor Tone - Muscle tone was assessed at the neck and appendages and was increase on the RUE and RLE.  Reflexes - The patient's reflexes were 3+ in RUE and RLE and he had no pathological reflexes.  Sensory - Light touch, temperature/pinprick were assessed and decreased on the right.    Coordination - The patient had normal movements in the left hand with no ataxia or dysmetria.  Tremor was absent.  Gait and Station - not tested in wheelchair   Functional score  mRS = 4   0 - No symptoms.   1 - No significant disability. Able to carry out all usual activities, despite some symptoms.   2 - Slight disability. Able to look after own affairs without assistance, but unable to carry out all previous activities.   3 - Moderate disability. Requires some help, but able to walk unassisted.   4 - Moderately severe disability. Unable to attend to own bodily needs without assistance, and unable to walk unassisted.   5 - Severe disability. Requires constant nursing care and attention, bedridden, incontinent.   6 - Dead.   NIH Stroke Scale   Level Of Consciousness 0=Alert; keenly responsive 1=Not alert, but arousable by minor stimulation 2=Not alert, requires repeated stimulation 3=Responds only with reflex movements 0  LOC Questions to Month and Age 48=Answers both questions correctly 1=Answers one question correctly 2=Answers  neither question correctly 0  LOC Commands      -Open/Close eyes     -Open/close grip 0=Performs both tasks correctly 1=Performs one task correctly 2=Performs neighter task correctly 0  Best Gaze 0=Normal 1=Partial gaze palsy 2=Forced deviation, or total gaze paresis 0  Visual 0=No visual loss 1=Partial hemianopia 2=Complete hemianopia 3=Bilateral hemianopia (blind including cortical blindness) 0  Facial Palsy 0=Normal symmetrical movement 1=Minor paralysis (asymmetry) 2=Partial paralysis (lower face) 3=Complete paralysis (upper and lower face) 0  Motor  0=No drift, limb holds posture for full 10 seconds 1=Drift, limb holds posture, no drift to bed 2=Some antigravity effort, cannot maintain posture, drifts to bed 3=No effort against gravity, limb falls 4=No movement Right Arm 1     Leg 1    Left Arm 0     Leg 0  Limb Ataxia 0=Absent 1=Present in one limb 2=Present in two limbs 0  Sensory 0=Normal 1=Mild to moderate sensory loss 2=Severe to total sensory loss 1  Best Language 0=No aphasia, normal 1=Mild to moderate aphasia 2=Mute, global aphasia 3=Mute, global aphasia 0  Dysarthria 0=Normal 1=Mild to moderate 2=Severe, unintelligible or mute/anarthric 0  Extinction/Neglect 0=No abnormality 1=Extinction to bilateral simultaneous stimulation 2=Profound neglect 0  Total   3     Data reviewed: I personally reviewed the images  and agree with the radiology interpretations.  [name] Contrast 10/28/2016 IMPRESSION: 1. Acute infarct in the left paramedian medulla. 2. Acute and subacute bilateral PICA territory infarcts as above. 3. Late subacute to chronic left medullary infarct.   CTA neck  - left VA occlusion with thready reconstitution in the neck - atherosclerosis without hemodynamically significant stenosis of the carotid arteries.  - severe right C6-7 neural foraminal narrowing  CTA head - occluded left VA and thrombus extending into central VBJ, at risk  of embolization - no anterior cirdulation LVO or significant stenosis  TTE - Left ventricle: The cavity size was normal. There was mild concentric hypertrophy. Systolic function was normal. The estimated ejection fraction was in the range of 60% to 65%. Wall motion was normal; there were no regional wall motion abnormalities. Doppler parameters are consistent with abnormal left ventricular relaxation (grade 1 diastolic dysfunction). - Aortic valve: Transvalvular velocity was within the normal range. There was no stenosis. There was no regurgitation. - Aorta: Ascending aortic diameter: 41 mm (S). - Ascending aorta: The ascending aorta was mildly dilated. - Mitral valve: Transvalvular velocity was within the normal range. There was no evidence for stenosis. There was no regurgitation. - Right ventricle: The cavity size was normal. Wall thickness was normal. Systolic function was normal. - Tricuspid valve: There was no regurgitation.  Component     Latest Ref Rng & Units 10/29/2016  Cholesterol     0 - 200 mg/dL 88  Triglycerides     <161 mg/dL 096  HDL Cholesterol     >40 mg/dL 17 (L)  Total CHOL/HDL Ratio     RATIO 5.2  VLDL     0 - 40 mg/dL 21  LDL (calc)     0 - 99 mg/dL 50  Hemoglobin E4V     4.8 - 5.6 % 6.1 (H)  Mean Plasma Glucose     mg/dL 409.81  Ammonia     9 - 35 umol/L 29    Assessment: As you may recall, he is a 51 y.o. 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 [facility]. CT head showed left PICA infarct.  Not able to do MRI due to claustrophobia.Discharged on aspirin 81 and Lipitor 40. 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 EF 60-65%.  LDL 50, A1c 6.1.   He was initially put on heparin IV, later switched to dual antiplatelet.  Continued Lipitor 40.  Discharge to CIR. During the interval time, the patient has been doing better. Right-sided weakness improved but with spasticity, will start low dose baclofen.  He follow-up with [facility] neurology Dr. Crissie Sickles and Dr. Raenette Rover recommend monotherapy of antiplatelet after 21 days based on CHANCE trial.  However, CHANCE Trial is more appropriate for Asian population, not necessarily applies to [place] population. Also, according to [employer] trial, with intracranial stenosis, dual antiplatelet for 3 months works well. Recent POINT trial done in [place] also showed 3 months DAPT beneficial. Although increased major bleeding than 3 weeks of DAPT, the bleeding incidence still low and the stroke prevention benefit continues beyond 3 weeks if on DAPT for 3 months. That is why 3 months of DAPT is preferred before switch to monotherapy hi this case. In terms  of monotherapy, I recommend plavix over ASA given he felt ASA alone after his first stroke.   [place], he has all his medical [facility], [facility] pharmacy assist and [facility], he would like to continue follow up with Dr. Crissie SicklesSeo and Dr. Raenette Roveroque. He will follow up with me as needed. I will provide letter for his  Disability this time, but he will follow up with Dr. Crissie SicklesSeo and Dr. Raenette Roveroque for further disability claims.   Plan:  - continue ASA and plavix for another 2 weeks and then plavix alone.  - continue lipitor for stroke prevention.  - [facility]. - continue aggressive PT/OT   - will prescribe baclofen for muscle spasm. Start with low dose 5mg  three time a day.  - Follow up with your primary care physician for stroke risk factor modification. Recommend maintain blood pressure goal <130/80, diabetes with hemoglobin A1c goal below 7.0% and lipids with LDL cholesterol goal below 70 mg/dL.  - check BP at home and record.  -  [facility] - I think you do not need two neurology service, due to your other services and [facility], I recommend to [facility] Dr. Crissie SicklesSeo or Dr. Raenette Roveroque. - recommend to follow up with Dr. Crissie SicklesSeo in 3 months for re-evaluation.  - follow up here as needed.   I spent more than 25 minutes of face to face time with the patient. Greater than 50% of time was spent in counseling and coordination of care.   No orders of the defined types were placed in this encounter.   Meds ordered this encounter  Medications  . Baclofen 5 MG TABS    Sig: Take 5 mg 3 (three) times daily by mouth.    Dispense:  90 tablet    Refill:  3    Patient Instructions  - continue ASA and plavix for another 2 weeks and then plavix only.  - continue lipitor for stroke prevention.  - [facility]. - continue aggressive PT/OT and work hard  - will prescribe baclofen for muscle spasm. Start with low dose 5mg  three time a day.  - Follow up with your primary care physician for stroke risk factor modification. Recommend maintain blood pressure goal <130/80, diabetes with hemoglobin A1c goal below 7.0% and lipids with LDL cholesterol goal below 70 mg/dL.  - check BP at home and record.  - [facility] - I think you do not need two neurology service, due to your other services and [facility], I recommend to [facility] Dr. Crissie SicklesSeo or Dr. Raenette Roveroque. - recommend to follow up with Dr. Crissie SicklesSeo in 3 months for re-evaluation.  - follow up here as needed.    Marvel PlanJindong Brandyn Thien, MD PhD [facility] [address], [address] [place], [place] [id number] [phone]

## 2017-01-13 NOTE — Progress Notes (Signed)
Letter for patients disability worker fax to [phone]. Also office note from Dr, Roda ShuttersXu on 1114/2018 fax to [phone]. Both were fax twice and confirmed via fax.  Copy of the letter only mail to patients address listed per his request.

## 2017-01-19 ENCOUNTER — Ambulatory Visit: Payer: Self-pay | Admitting: Physical Medicine & Rehabilitation

## 2018-11-10 IMAGING — DX DG CHEST 1V PORT
1 series · 1 of 1 positions shown · non-contrast
Comparison: None.

CLINICAL DATA: 51-year-old male with stroke.

EXAM:
PORTABLE CHEST 1 VIEW

[chest ap]
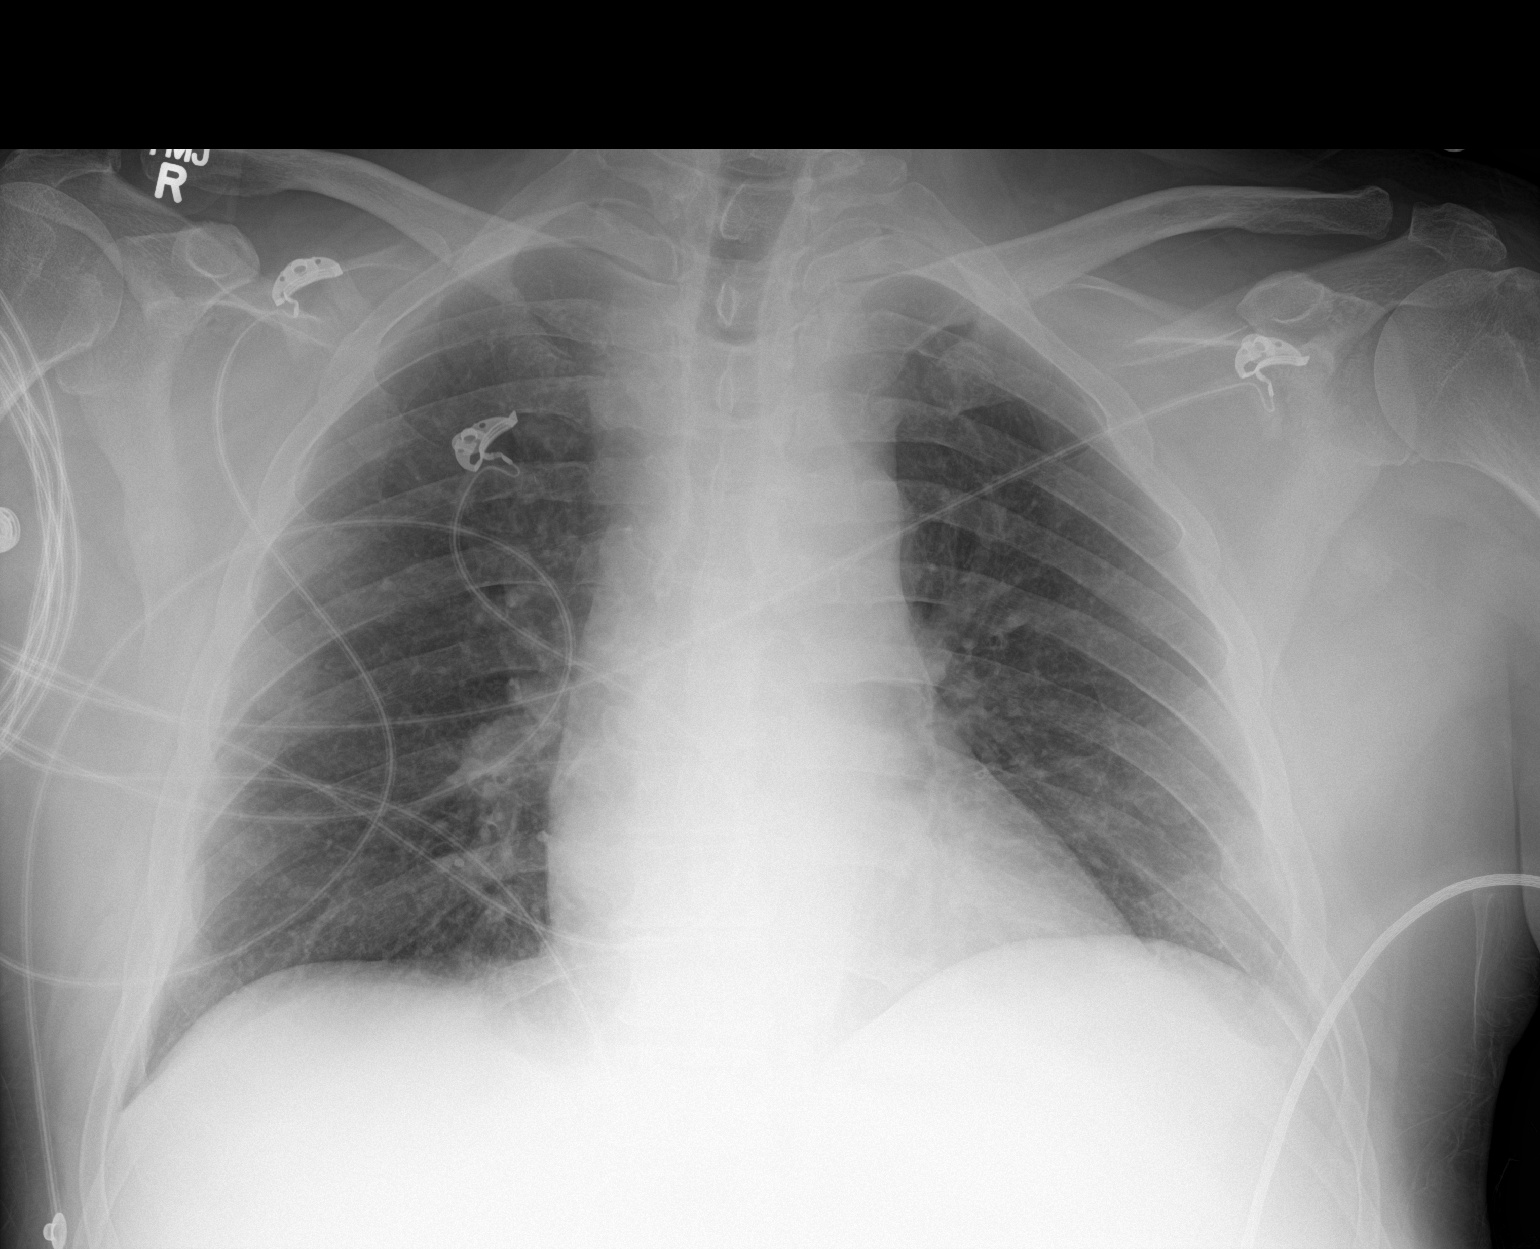

[1 of 1 positions shown; findings below may reference images not displayed]

FINDINGS: The heart size and mediastinal contours are within normal limits.
Both lungs are clear. The visualized skeletal structures are
unremarkable.
IMPRESSION: No active disease.
